# Patient Record
Sex: Female | Born: 1955
Health system: Southern US, Community
[De-identification: ages and names within clinical notes are randomized; demographics above are authoritative.]

## PROBLEM LIST (undated history)

## (undated) DIAGNOSIS — F329 Major depressive disorder, single episode, unspecified: Secondary | ICD-10-CM

## (undated) DIAGNOSIS — F419 Anxiety disorder, unspecified: Secondary | ICD-10-CM

## (undated) DIAGNOSIS — N952 Postmenopausal atrophic vaginitis: Secondary | ICD-10-CM

## (undated) DIAGNOSIS — K219 Gastro-esophageal reflux disease without esophagitis: Secondary | ICD-10-CM

## (undated) DIAGNOSIS — M1990 Unspecified osteoarthritis, unspecified site: Secondary | ICD-10-CM

## (undated) DIAGNOSIS — F32A Depression, unspecified: Secondary | ICD-10-CM

## (undated) DIAGNOSIS — J302 Other seasonal allergic rhinitis: Secondary | ICD-10-CM

## (undated) DIAGNOSIS — I10 Essential (primary) hypertension: Secondary | ICD-10-CM

## (undated) DIAGNOSIS — G4730 Sleep apnea, unspecified: Secondary | ICD-10-CM

## (undated) HISTORY — PX: OTHER SURGICAL HISTORY: SHX169

## (undated) HISTORY — DX: Sleep apnea, unspecified: G47.30

## (undated) HISTORY — DX: Anxiety disorder, unspecified: F41.9

## (undated) HISTORY — DX: Postmenopausal atrophic vaginitis: N95.2

## (undated) HISTORY — DX: Other seasonal allergic rhinitis: J30.2

## (undated) HISTORY — PX: COLONOSCOPY: SHX174

## (undated) HISTORY — PX: ABDOMINAL HYSTERECTOMY: SHX81

## (undated) HISTORY — DX: Unspecified osteoarthritis, unspecified site: M19.90

---

## 2002-04-05 ENCOUNTER — Other Ambulatory Visit: Admission: RE | Admit: 2002-04-05 | Discharge: 2002-04-05 | Payer: Self-pay | Admitting: Obstetrics and Gynecology

## 2002-04-08 ENCOUNTER — Ambulatory Visit (HOSPITAL_COMMUNITY): Admission: RE | Admit: 2002-04-08 | Discharge: 2002-04-09 | Payer: Self-pay | Admitting: Surgery

## 2002-05-25 ENCOUNTER — Encounter: Admission: RE | Admit: 2002-05-25 | Discharge: 2002-05-25 | Payer: Self-pay | Admitting: Obstetrics and Gynecology

## 2002-05-25 ENCOUNTER — Encounter: Payer: Self-pay | Admitting: Obstetrics and Gynecology

## 2002-06-24 ENCOUNTER — Ambulatory Visit (HOSPITAL_BASED_OUTPATIENT_CLINIC_OR_DEPARTMENT_OTHER): Admission: RE | Admit: 2002-06-24 | Discharge: 2002-06-24 | Payer: Self-pay | Admitting: Surgery

## 2002-07-05 ENCOUNTER — Ambulatory Visit (HOSPITAL_BASED_OUTPATIENT_CLINIC_OR_DEPARTMENT_OTHER): Admission: RE | Admit: 2002-07-05 | Discharge: 2002-07-05 | Payer: Self-pay | Admitting: Surgery

## 2002-10-18 ENCOUNTER — Ambulatory Visit (HOSPITAL_COMMUNITY): Admission: RE | Admit: 2002-10-18 | Discharge: 2002-10-18 | Payer: Self-pay | Admitting: Gastroenterology

## 2005-01-07 ENCOUNTER — Encounter: Admission: RE | Admit: 2005-01-07 | Discharge: 2005-01-07 | Payer: Self-pay | Admitting: Family Medicine

## 2005-12-27 ENCOUNTER — Encounter: Admission: RE | Admit: 2005-12-27 | Discharge: 2005-12-27 | Payer: Self-pay | Admitting: Family Medicine

## 2006-12-22 ENCOUNTER — Ambulatory Visit (HOSPITAL_COMMUNITY): Admission: RE | Admit: 2006-12-22 | Discharge: 2006-12-23 | Payer: Self-pay | Admitting: Orthopedic Surgery

## 2008-10-24 ENCOUNTER — Encounter: Admission: RE | Admit: 2008-10-24 | Discharge: 2008-10-24 | Payer: Self-pay | Admitting: Orthopedic Surgery

## 2009-01-30 ENCOUNTER — Encounter: Admission: RE | Admit: 2009-01-30 | Discharge: 2009-01-30 | Payer: Self-pay | Admitting: Family Medicine

## 2010-06-10 HISTORY — PX: JOINT REPLACEMENT: SHX530

## 2010-07-01 ENCOUNTER — Encounter: Payer: Self-pay | Admitting: Family Medicine

## 2010-09-20 ENCOUNTER — Other Ambulatory Visit: Payer: Self-pay | Admitting: Orthopedic Surgery

## 2010-09-20 ENCOUNTER — Other Ambulatory Visit (HOSPITAL_COMMUNITY): Payer: Self-pay | Admitting: Orthopedic Surgery

## 2010-09-20 ENCOUNTER — Ambulatory Visit (HOSPITAL_COMMUNITY)
Admission: RE | Admit: 2010-09-20 | Discharge: 2010-09-20 | Disposition: A | Discharge status: Home / Self Care | Payer: 59 | Source: Ambulatory Visit | Attending: Orthopedic Surgery | Admitting: Orthopedic Surgery

## 2010-09-20 ENCOUNTER — Encounter (HOSPITAL_COMMUNITY): Payer: 59

## 2010-09-20 DIAGNOSIS — Z01810 Encounter for preprocedural cardiovascular examination: Secondary | ICD-10-CM | POA: Insufficient documentation

## 2010-09-20 DIAGNOSIS — Z01818 Encounter for other preprocedural examination: Secondary | ICD-10-CM | POA: Insufficient documentation

## 2010-09-20 DIAGNOSIS — I10 Essential (primary) hypertension: Secondary | ICD-10-CM

## 2010-09-20 DIAGNOSIS — M1710 Unilateral primary osteoarthritis, unspecified knee: Secondary | ICD-10-CM | POA: Insufficient documentation

## 2010-09-20 DIAGNOSIS — Z01812 Encounter for preprocedural laboratory examination: Secondary | ICD-10-CM | POA: Insufficient documentation

## 2010-09-20 LAB — CBC
HCT: 40.5 % (ref 36.0–46.0)
Hemoglobin: 13.4 g/dL (ref 12.0–15.0)
MCV: 92.7 fL (ref 78.0–100.0)
RBC: 4.37 MIL/uL (ref 3.87–5.11)
WBC: 7.4 10*3/uL (ref 4.0–10.5)

## 2010-09-20 LAB — URINALYSIS, ROUTINE W REFLEX MICROSCOPIC
Glucose, UA: NEGATIVE mg/dL
Hgb urine dipstick: NEGATIVE
Protein, ur: NEGATIVE mg/dL
Specific Gravity, Urine: 1.019 (ref 1.005–1.030)
pH: 6.5 (ref 5.0–8.0)

## 2010-09-20 LAB — BASIC METABOLIC PANEL
BUN: 17 mg/dL (ref 6–23)
CO2: 28 mEq/L (ref 19–32)
Chloride: 103 mEq/L (ref 96–112)
Creatinine, Ser: 0.77 mg/dL (ref 0.4–1.2)
Potassium: 4 mEq/L (ref 3.5–5.1)

## 2010-09-20 LAB — PROTIME-INR
INR: 0.99 (ref 0.00–1.49)
Prothrombin Time: 13.3 s (ref 11.6–15.2)

## 2010-09-20 LAB — SURGICAL PCR SCREEN
MRSA, PCR: NEGATIVE
Staphylococcus aureus: POSITIVE — AB

## 2010-09-20 LAB — DIFFERENTIAL
Eosinophils Relative: 3 % (ref 0–5)
Lymphocytes Relative: 27 % (ref 12–46)
Lymphs Abs: 2 10*3/uL (ref 0.7–4.0)
Monocytes Absolute: 0.7 10*3/uL (ref 0.1–1.0)
Monocytes Relative: 9 % (ref 3–12)
Neutro Abs: 4.5 10*3/uL (ref 1.7–7.7)

## 2010-09-28 NOTE — H&P (Signed)
[name], [name]              ACCOUNT NO.:  [id number]  MEDICAL RECORD NO.:  [id number]           PATIENT TYPE:  I  LOCATION:  1S                           FACILITY:  [facility]  PHYSICIAN:  Madlyn Frankel. Charlann Boxer, M.D.  DATE OF BIRTH:  [birth year]  DATE OF ADMISSION:  09/18/2010 DATE OF DISCHARGE:                             HISTORY & PHYSICAL   CHIEF COMPLAINT:  Right knee osteoarthritis.  HISTORY OF PRESENT ILLNESS:  This is a 55 year old lady with a history of osteoarthritis of the right knee that has failed conservative management to alleviate her discomfort.  After discussion of treatment, benefits, risks, and options, the patient is now scheduled for total knee arthroplasty of her right knee.  The patient is a candidate for Tranexamic acid and will receive that in preop holding area.  She is going to be going to a rehab facility postoperatively so postoperative medicines were not given today.  PAST MEDICAL HISTORY:  Drug allergy to IODINE on the skin with rash.  CURRENT MEDICATIONS: 1. Prozac 40 mg daily. 2. Nexium 40 mg daily. 3. Lisinopril 10 mg daily. 4. Aspirin 81 mg two daily. 5. Nabumetone 1500 mg daily. 6. Alprazolam 0.25 mg p.r.n. 7. Hydrocodone 10/650 one-half tablet daily. 8. AdrenaSense for hot flashes, it is an over-the-counter medication. 9. Flonase p.r.n.  PREVIOUS SURGERIES:  Knee arthroscopy, rotator cuff repair, and hysterectomy.  SERIOUS MEDICAL ILLNESSES:  Reflux and hypertension.  FAMILY HISTORY:  Positive for cancer.  SOCIAL HISTORY:  The patient is married.  She works as a Interior and spatial designer. She does not smoke and has 2 drinks per day.  REVIEW OF SYSTEMS:  CENTRAL NERVOUS SYSTEM:  Positive for anxiety and depression.  CARDIOVASCULAR:  Negative for chest pain or palpitation. PULMONARY:  Negative for shortness of breath, PND, or orthopnea.  GI: Negative for ulcers or hepatitis.  GU:  Negative.  MUSCULOSKELETAL:  As in HPI.  PHYSICAL EXAMINATION:   VITAL SIGNS:  BP 160/108, repeated 160/110.  She is advised to go by her medical doctor's office on leaving our office today to have this rechecked and possibly modify her medications, respirations 16, pulse 76 and regular. GENERAL APPEARANCE:  This is a well-developed and well-nourished lady in no acute distress. HEENT:  Head normocephalic.  Nose patent.  Ears patent.  Pupils equal, round, and reactive to light.  Throat without injection. NECK:  Supple without adenopathy.  Carotids are 2+ without bruit. CHEST:  Clear to auscultation.  No rales or rhonchi.  Respirations 16. HEART:  Regular rate and rhythm at 76 beats per minute without murmur. ABDOMEN:  Soft.  Active bowel sounds.  No masses or organomegaly. NEUROLOGIC:  The patient is alert and oriented to time, place, and person.  Cranial nerves II through XII grossly intact. EXTREMITIES:  The right knee with a 4-degree flexion contracture, further flexion to 115 degrees with pain.  Neurovascular status intact.  ASSESSMENT:  Osteoarthritis, right knee.  PLAN:  Total knee arthroplasty, right knee.     Jaquelyn Bitter. Chabon, P.A.   ______________________________ Madlyn Frankel Charlann Boxer, M.D.    SJC/MEDQ  D:  09/19/2010  T:  09/20/2010  Job:  [id number]  Electronically Signed by Jodene Nam P.A. on 09/26/2010 07:35:18 AM Electronically Signed by Durene Romans M.D. on 09/28/2010 03:44:57 PM

## 2010-10-01 ENCOUNTER — Inpatient Hospital Stay (HOSPITAL_COMMUNITY)
Admission: RE | Admit: 2010-10-01 | Discharge: 2010-10-04 | DRG: 470 | Disposition: A | Discharge status: Skilled Nursing Facility | Payer: 59 | Source: Ambulatory Visit | Attending: Orthopedic Surgery | Admitting: Orthopedic Surgery

## 2010-10-01 DIAGNOSIS — F411 Generalized anxiety disorder: Secondary | ICD-10-CM | POA: Diagnosis present

## 2010-10-01 DIAGNOSIS — I10 Essential (primary) hypertension: Secondary | ICD-10-CM | POA: Diagnosis present

## 2010-10-01 DIAGNOSIS — K219 Gastro-esophageal reflux disease without esophagitis: Secondary | ICD-10-CM | POA: Diagnosis present

## 2010-10-01 DIAGNOSIS — M1710 Unilateral primary osteoarthritis, unspecified knee: Principal | ICD-10-CM | POA: Diagnosis present

## 2010-10-01 LAB — ABO/RH: ABO/RH(D): A NEG

## 2010-10-01 LAB — TYPE AND SCREEN: ABO/RH(D): A NEG

## 2010-10-01 NOTE — Op Note (Signed)
[name], [name]              ACCOUNT NO.:  [id number]  MEDICAL RECORD NO.:  [id number]           PATIENT TYPE:  I  LOCATION:  0008                         FACILITY:  [facility]  PHYSICIAN:  Madlyn Frankel. Charlann Boxer, M.D.  DATE OF BIRTH:  19-Feb-1956  DATE OF PROCEDURE:  10/01/2010 DATE OF DISCHARGE:                              OPERATIVE REPORT   PREOPERATIVE DIAGNOSIS:  Right knee osteoarthritis.  POSTOPERATIVE DIAGNOSIS:  Right knee osteoarthritis.  PROCEDURE:  Right total knee replacement.  COMPONENTS USED:  DePuy rotating platform posterior stabilized knee system with size 3 femur, 3 tibia, 12.5 insert, and 38 patellar button.  SURGEON:  Madlyn Frankel. Charlann Boxer, MD  ASSISTANT:  Jaquelyn Bitter. Chabon, PA-C  ANESTHESIA:  Spinal.  SPECIMEN:  None.  COMPLICATIONS:  None.  DRAINS:  One Hemovac.  TOURNIQUET TIME:  28 minutes at 250 mmHg.  BLOOD LOSS:  Minimal.  INDICATIONS FOR PROCEDURE:  [name] is a 55 year old female who has been following the office for right knee degenerative joint changes. She had failed respond to conservative measures at this point and wished to proceed with more definitive measures.  Right total knee replacement discussed versus other options available based on the location of her pain.  Risks of infection, DVT, component failure, need for revision surgery as well as the postop course and expectations were all reviewed. Consent was obtained for benefit of pain relief.  PROCEDURE IN DETAIL:  The patient was brought to operative theater. Once adequate anesthesia, preoperative antibiotics, Ancef administered, she was positioned supine with the right thigh tourniquet placed.  The right lower extremity was then prepped and draped in sterile fashion with the right leg placed in the Mayo leg holder.  A time-out was performed identifying the patient, planned procedure, and the extremity.  Leg was exsanguinated, tourniquet elevated to 250 mmHg.  Midline incision  was made followed by median and parapatellar arthrotomy. Following initial exposure and debridement, attention was directed to the patella, precut measure was 21 mm.  I resected down to about 13-14 mm and used a 38 patellar button, restored patellar height, and covered the cut surface.  Lug holes were drilled and a metal shim placed to protect the patella from retractors and saw blades.  Attention was now directed to the femur.  The femoral canal was opened with the drill, irrigated to try to prevent fat emboli, and intramedullary rod was passed at 3 degrees of valgus, 10 mm of bone resected off the distal femur.  Following this resection, the tibia was subluxated anteriorly using extramedullary guide, and 10 mm of bone was resected off the proximal based off the proximal lateral tibia.  I then confirmed the cut, was perpendicular in the coronal plane as well as having appropriate neutral slope.  I then confirmed the gap, was stable with a 10-mm insert.  At this point, we sized the femur to be a size 3.  A size 3 rotation block was pinned in position in the anterior reference using the C clamp with set rotation.  The former cutting block was then placed to confirm that there will be no notching pinned into  position.  At this point, the anterior-posterior and chamfer cuts were then made without difficulty nor notching.  Final box cut was made off the lateral aspect of distal femur.  The tibia was then subluxated anteriorly and using the size 3 tibial tray, was pinned into position, drilled, and keel punched.  Trial reduction was now carried out with 3 femur, 3 tibia, and initially a 10- mm insert where it was thought there was a little bit of hyperextension. For this reason, I went ahead and exchanged out for the 12.5 insert to help with both extension and flexion stability.  The patella tracked through the trochlea without application of pressure.  Given these findings, the trial  components were removed.  The synovial capsule junction of the knee was injected with 0.25% Marcaine with epinephrine, 1 mL of Toradol, total of 51 mL.  The knee was then irrigated with normal saline solution and pulse lavaged.  Final components were opened and cement mixed.  Final components were cemented onto clean and dried cut surfaces of bone.  Knee was brought to extension with a 12.5 insert.  As the cement cured, excessive cement was removed.  Once the cement had fully cured, an excessive cement was removed throughout the knee.  The final 12.5 insert was chosen and inserted.  Tourniquet had been let down after 28 minutes without significant hemostasis required.  Medium Hemovac drain was placed deep and the knee was reirrigated with normal saline solution.  The extensor mechanism was then reapproximated using #1 Vicryl with the knee in flexion.  The remainder of wound was closed with 2-0 Vicryl, running for Monocryl.  The knee was cleaned, dried, and dressed sterilely with Dermabond and Aquacel dressing.  Drain site dressed separately.  She was then brought to recovery room in stable condition tolerating the procedure well.     Madlyn Frankel Charlann Boxer, M.D.     MDO/MEDQ  D:  10/01/2010  T:  10/01/2010  Job:  [id number]  Electronically Signed by Durene Romans M.D. on 10/01/2010 06:15:41 PM

## 2010-10-02 LAB — BASIC METABOLIC PANEL
CO2: 29 mEq/L (ref 19–32)
Calcium: 8.3 mg/dL — ABNORMAL LOW (ref 8.4–10.5)
Creatinine, Ser: 0.66 mg/dL (ref 0.4–1.2)
GFR calc Af Amer: >60 mL/min (ref >60)
GFR calc non Af Amer: >60 mL/min (ref >60)
Glucose, Bld: 99 mg/dL (ref 70–99)
Sodium: 138 mEq/L (ref 135–145)

## 2010-10-02 LAB — CBC
HCT: 35.8 % — ABNORMAL LOW (ref 36.0–46.0)
Hemoglobin: 11.6 g/dL — ABNORMAL LOW (ref 12.0–15.0)
MCH: 30.1 pg (ref 26.0–34.0)
MCHC: 32.4 g/dL (ref 30.0–36.0)
RDW: 14.2 % (ref 11.5–15.5)

## 2010-10-03 LAB — BASIC METABOLIC PANEL
Calcium: 8.9 mg/dL (ref 8.4–10.5)
Creatinine, Ser: 0.7 mg/dL (ref 0.4–1.2)
GFR calc Af Amer: >60 mL/min (ref >60)
GFR calc non Af Amer: >60 mL/min (ref >60)
Sodium: 137 mEq/L (ref 135–145)

## 2010-10-03 LAB — CBC
HCT: 34.4 % — ABNORMAL LOW (ref 36.0–46.0)
Hemoglobin: 11.7 g/dL — ABNORMAL LOW (ref 12.0–15.0)
MCH: 31 pg (ref 26.0–34.0)
MCHC: 34 g/dL (ref 30.0–36.0)
MCV: 91.2 fL (ref 78.0–100.0)
Platelets: 487 K/uL — ABNORMAL HIGH (ref 150–400)
RBC: 3.77 MIL/uL — ABNORMAL LOW (ref 3.87–5.11)
RDW: 13.8 % (ref 11.5–15.5)
WBC: 11 K/uL — ABNORMAL HIGH (ref 4.0–10.5)

## 2010-10-08 NOTE — Discharge Summary (Signed)
NAMEJILLIANNE, [name]              ACCOUNT NO.:  [id number]  MEDICAL RECORD NO.:  [id number]           PATIENT TYPE:  I  LOCATION:  1620                         FACILITY:  [facility]  PHYSICIAN:  Madlyn Frankel. Charlann Boxer, M.D.  DATE OF BIRTH:  May 09, 1956  DATE OF ADMISSION:  10/01/2010 DATE OF DISCHARGE:                              DISCHARGE SUMMARY   ADMITTING DIAGNOSIS:  Right knee osteoarthritis.  DISCHARGE DIAGNOSES: 1. Right knee osteoarthritis. 2. Hypertension. 3. Reflux disease. 4. Anxiety. 5. History of menopause.  BRIEF HISTORY:  [name] is a 55 year old female with history of right knee osteoarthritis, failing conservative measures, progressive degenerative changes with pain.  She, at this point, is ready for knee replacement surgery.  She wished to proceed in this fashion.  Risks and benefits were discussed in office and consent obtained based on her office visits.  HOSPITAL COURSE:  The patient admitted for same-day surgery on October 01, 2010.  She underwent right total knee replacement.  This was an uncomplicated procedure.  Please see dictated operative note for full details of procedure.  After routine stay in the recovery room, she was transferred to the orthopedic ward, where her hospital stay was unremarkable.  She did have some problems on the first postoperative day with regards some nausea and pain issues.  She had a Hemovac drain and Foley catheter removed.  On postop day #1, she is noted to have hematocrit of 35.8.  Electrolytes remained stable. She was seen and evaluated by therapy and mobilized to a degree. Preoperatively, she wished to proceed to skilled nursing facility for rehab purposes, FL-2 form filled out and social work consult made.  Postop day #2, she felt that she was doing much better with regards to pain control and ready to tackle a day.  Her hematocrit remained stable at 34.4 and electrolytes remained stable.  Her knee wound was  dry. Hemovac had been removed.  Postoperative day #2 on October 03, 2010, we had prepared her discharge due to the fact that she is going to rehab with standard insurance, non Medicare.  She was on a regular diet, tolerating this well.  DISCHARGE CONDITION:  She is stable at the time of discharge with no postoperative complication.  DISCHARGE INSTRUCTIONS:  Patient will be discharged as planned preoperatively for skilled nursing facility for rehab purposes.  She will work on Investment banker, operational, strengthening, range of motion.  She returned to see Dr. Durene Romans at [facility] at office number [phone] in 2 weeks' time for wound evaluation and assessing her recovery.  She may shower, her current dressing is an Aquacel dressing that remained in place until Oct 09, 2010, it can be then removed and dry gauze applied.  If there are any orthopedic questions, we can be contacted.  DISCHARGE MEDICATIONS: 1. Colace 100 mg b.i.d. as needed for constipation while on pain     medicine. 2. MiraLax 17 g p.o. daily as needed for constipation while on pain     medicine. 3. Aspirin 325 mg p.o. b.i.d. for blood thinning purposes. 4. Iron 325 mg 1 tablet b.i.d. as needed for 2 weeks.  5. Robaxin 500 mg p.o. q.6h. as needed for muscle spasm or pain. 6. Oxycodone 5 to 15 mg every 4 to 6 hours as needed for pain. 7. __________ 2 capsules q.a.m. for hot flashes. 8. Alprazolam 0.25 mg b.i.d. as needed. 9. Flonase daily as needed. 10.Aspirin 81 mg after 325 mg doses as previously taken. 11.Hydroxycut over-the-counter 2 capsules b.i.d. 12.Lisinopril 10 mg q.a.m. 13.Relafen as needed. 14.Nexium 40 mg q.a.m. 15.Prozac 40 mg q.a.m.     Madlyn Frankel Charlann Boxer, M.D.     MDO/MEDQ  D:  10/03/2010  T:  10/03/2010  Job:  [id number]  Electronically Signed by Durene Romans M.D. on 10/08/2010 12:14:24 PM

## 2010-10-23 NOTE — Op Note (Signed)
NAMEWILLIAM, [name]              ACCOUNT NO.:  [id number]   MEDICAL RECORD NO.:  [id number]          PATIENT TYPE:  AMB   LOCATION:  DAY                          FACILITY:  [facility]   PHYSICIAN:  Marlowe Kays, M.D.  DATE OF BIRTH:  06/15/1955   DATE OF PROCEDURE:  12/22/2006  DATE OF DISCHARGE:                               OPERATIVE REPORT   PREOPERATIVE DIAGNOSIS:  Chronic impingement syndrome with rotator cuff  tendinopathy, right shoulder.   POSTOPERATIVE DIAGNOSIS:  Chronic impingement syndrome with rotator cuff  tendinopathy, right shoulder.   OPERATION:  Right shoulder arthroscopy (within normal limits);  arthroscopic subacromial decompression and decompression of distal  inferior clavicle.   SURGEON:  Marlowe Kays, M.D.   ASSISTANT:  Nurse.   ANESTHESIA:  General anesthesia.   PATHOLOGY AND JUSTIFICATION FOR PROCEDURE:  Painful right shoulder with  MRI on December 10, 2006, demonstrating partial tear of the supraspinatus  tendon without evidence of a full tear.  At surgery, these findings were  confirmed but she also had some impingement from the distal inferior  clavicle as well.   PROCEDURE:  Satisfactory general anesthesia, beach-chair position on the  Allen frame, time-out performed, right shoulder girdle was prepped with  pHisoHex because of her iodine allergy and draped in sterile field.  Anatomy of the shoulder joint was marked out and posterior soft spot and  lateral portals were infiltrated with 0.50% Marcaine and adrenalin as  was the subacromial space.  Through a posterior soft spot portal I  atraumatically entered the glenohumeral joint which was normal on  evaluation, representative pictures were taken.  I then redirected the  scope to the subacromial space and through a lateral portal, introduced  a 4.2 shaver.  She had a fairly vibrant bursitis which I cleared with  the 4.2 shaver.  I then introduced the ArthroCare vaporizer and began  removing soft  tissue from the undersurface of the acromion and finding  that the distal clavicle was also impingement fracture from there as  well.  Followed this with a 4 mm oval bur, decompressing the clavicle  and acromion and went back-and-forth between the bur and the vaporizer  until we had wide decompression, documented with arm to her side and arm  abducted.  At the conclusion of the case, there was no bleeding  apparent.  Decompression had been completed.  I removed all fluid  possible from the subacromial space and infiltrated this space as well  as the two portals with Marcaine with adrenalin once again and closed  the portals and 4-0 nylon.  We applied pHisoHex, Adaptic and dry sterile  dressing followed by a shoulder immobilizer.  She tolerated the  procedure well and was taken to the recovery room in satisfactory  condition with no known complications.           ______________________________  Marlowe Kays, M.D.    JA/MEDQ  D:  12/22/2006  T:  12/22/2006  Job:  [id number]

## 2010-10-26 NOTE — Op Note (Signed)
NAME:  [name], [name]                        ACCOUNT NO.:  [id number]   MEDICAL RECORD NO.:  [id number]                   PATIENT TYPE:  AMB   LOCATION:  DAY                                  [facility]ACILITY:  [facility]   PHYSICIAN:  Currie Paris, M.D.           DATE OF BIRTH:  [birth year]   DATE OF PROCEDURE:  04/08/2002  DATE OF DISCHARGE:                                 OPERATIVE REPORT   PREOPERATIVE DIAGNOSES:  Perirectal abscess, left side.   POSTOPERATIVE DIAGNOSES:  Perirectal abscess, left side.   OPERATION:  I & D of deep perirectal, possible supralevator abscess.   SURGEON:  Currie Paris, M.D.   ANESTHESIA:  General.   CLINICAL HISTORY:  This patient is a 55 year old presenting with an  increasing mass in the left buttock area that essentially is currently  treated with some antibiotics, and it has continued to get worse.  She has  had a fever today but had increasing pain.  On exam, she had a fluctuant  mass extending from the perianal area laterally into the left buttock.  After discussion with the patient, she elected to proceed to drainage under  anesthesia.   DESCRIPTION OF PROCEDURE:  The patient was seen in the holding area and had  no further questions. She was taken to the operating room, and after  satisfactory general endotracheal anesthesia had been obtained, she was  placed in the lithotomy position and the perineal area prepped and draped.  A digital rectal exam showed really no fluctuance perirectally inside the  rectum, but the most fluctuance was noted just lateral to the sphincter  muscles in the direct lateral position on the left side.  The fluctuance  extended several cm beyond this.   I made an elliptical skin incision to take a small button of skin, going in  a radial fashion directly over the most fluctuant area and then entered a  large abscess cavity.  The incision was extended a little bit laterally, so  I had a good open drainage,  and I was able to put a tonsillar suction device  in and sucked this completely dry.  The cavity extended perirectally fairly  high up.  I was not convinced whether it went supralevator or not but  decided not to probe too much higher up as I thought I had good drainage  here.   Using the anoscope, I tried to find an internal opening and saw one  possibility, but I did not want to probe too much and create a false  channel.   Everything appeared to be dry.  I used cautery on skin edges, and I put 1-  inch Penrose drains in and held them with a suture to promote drainage.  I  packed it and completed the procedure.   The patient tolerated the procedure well.  There were no operative  complications.  All counts were correct.  Currie Paris, M.D.    CJS/MEDQ  D:  04/08/2002  T:  04/09/2002  Job:  [id number]

## 2010-10-26 NOTE — Op Note (Signed)
NAME:  [name], [name]                        ACCOUNT NO.:  [id number]   MEDICAL RECORD NO.:  [id number]                   PATIENT TYPE:  AMB   LOCATION:  DSC                                  FACILITY:  [facility]   PHYSICIAN:  Currie Paris, M.D.           DATE OF BIRTH:  23-Jul-1955   DATE OF PROCEDURE:  06/24/2002  DATE OF DISCHARGE:                                 OPERATIVE REPORT   OFFICE MEDICAL RECORD NUMBER:  [id number]   PREOPERATIVE DIAGNOSIS:  Fistula in ano.   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   PROCEDURE:  Partial anal fistulotomy with drainage and debridement of  chronic abscess and placement of Seton.   SURGEON:  Currie Paris, M.D.   ANESTHESIA:  General.   INDICATIONS FOR PROCEDURE:  This patient is a 54 year old lady who had a  large perirectal abscess drained a few months ago, but has developed a  nonhealing fistula.  She continues to have intermittent drainage with  occasional feculent drainage especially when she would have an episode of  diarrhea.   DESCRIPTION OF PROCEDURE:  The patient was seen in the holding area and had  no further questions. She was taken to the operating room and after  satisfactory general anesthesia had been obtained, she was placed in the  lithotomy position.  The perianal area was prepped with Hibiclens-type  solution, since the patient is allergic to iodine and draped.  Initially, I  did a rectal examination and could feel no masses or induration.  The  anoscope was introduced and since the abscess seemed to have drained just a  little bit posterior to the midline, I was looking for an internal opening,  and putting a probe into the tract, but while the probe went in about 3 to 4  cm, I could not really identify initially an opening, especially looking  posteriorly.  I tried injecting a little peroxide and still did not see an  opening.  Then I moved my scope around so I could  look more directly  laterally, and saw an area that is just anterior to the midline on the left  lateral aspect and using a separate probe, found that this drained into the  fistula.  With a little more gentle manipulation, I was able to get the  probe from outside to go into the tract.   I injected some Marcaine with epinephrine to help with postoperative  analgesia and also to diminish bleeding.  Starting from the external  opening, I divided the skin and subcutaneous tissues over the probe until I  saw where I was getting sphincter muscle where I stopped dividing anything  other than a little bit of the mucosa over the sphincter.  There was  actually, a fairly sizable abscess cavity present, perhaps a couple of cm in  size, and once I had good access with this external opening, I  used a curet  and some gauze to swab out any old granulations in this whole cavity.   I then passed two #1 Tycron sutures through to act as a Patent examiner. One was tied  loosely and the other snugly just around the sphincter so that it could  being to cut through.  I then put a 1/4 inch Penrose drain into the abscess  cavity so we would make sure we have good drainage of the cavity for the  first days postoperatively.   Sterile dressings were applied. The patient tolerated the procedure well.  There were no operative complications.  All needle, sponge, and instrument  count correct.                                               Currie Paris, M.D.    CJS/MEDQ  D:  06/24/2002  T:  06/24/2002  Job:  [id number]   cc:   L. Lupe Carney, M.D.  [address] [place]  [place] [address]  Fax: [phone]   Juluis Mire, M.D.  [address] [place]  [place] [address]  Fax: [phone]

## 2010-10-26 NOTE — Op Note (Signed)
NAME:  [name], [name]                        ACCOUNT NO.:  [id number]   MEDICAL RECORD NO.:  [id number]                   PATIENT TYPE:  AMB   LOCATION:  ENDO                                 FACILITY:  [facility]   PHYSICIAN:  Anselmo Rod, M.D.               DATE OF BIRTH:  Dec 26, 1955   DATE OF PROCEDURE:  10/18/2002  DATE OF DISCHARGE:                                 OPERATIVE REPORT   PROCEDURE:  Colonoscopy.   ENDOSCOPIST:  Anselmo Rod, M.D.   INSTRUMENT USED:  Olympus video colonoscope.   INDICATIONS FOR PROCEDURE:  A 55 year old white female with a history of  rectal fistula and rectal bleeding, rule out IBD.   PREPROCEDURE PREPARATION:  Informed consent was procured from the patient  and the patient was fasted for eight hours prior to the patient and prepped  with a bottle of magnesium citrate and a gallon of NuLytely the night prior  to the procedure.   PREPROCEDURE PHYSICAL EXAMINATION:  VITAL SIGNS: Stable.  NECK:  Supple.  CHEST:  Clear to auscultation.  S1 and S2 regular.  ABDOMEN:  Soft with normal bowel sounds.   DESCRIPTION OF PROCEDURE:  The patient was placed in the left lateral  decubitus position and sedated with 100 mg of Demerol and 10 mg of Versed  intravenously.  Once the patient was adequately sedated and maintained on  low flow oxygen and continuous cardiac monitoring, the Olympus video  colonoscope was advanced from the rectum to the cecum and terminal ileum.  The appendiceal orifice and ileocecal valve were clearly visualized and  photographed. The terminal ileum appeared normal without lesion. There was  no evidence of Crohn's disease or colitis. The entire colonic mucosa  appeared healthy and without lesions.  Retroflexion in the rectum revealed  small internal hemorrhoids. There was a surgical scar present on the  anterior aspect of the buttocks from a previous fistula repair. The patient  tolerated the procedure well without  complications.   IMPRESSION:  1. Normal colonoscopy to the terminal ileum except for small internal     hemorrhoids.  2. Scar present on medial aspect of the buttocks from fistula repair in the     past.    RECOMMENDATIONS:  Repeat colorectal cancer screening is recommended in the  next five years, unless the patient develops any abnormal symptoms in the  interim.  Continue present therapy.  Outpatient follow-up as needed.                                               Anselmo Rod, M.D.    JNM/MEDQ  D:  10/18/2002  T:  10/19/2002  Job:  [id number]   cc:   S. Kyra Manges, M.D.  [phone]  [place]  [place] [address]  Fax: [phone]   Currie Paris, M.D.  [address]  [place]  [place] [address]  Fax: [phone]

## 2010-10-26 NOTE — Op Note (Signed)
   NAME:  [name], [name]                        ACCOUNT NO.:  [id number]   MEDICAL RECORD NO.:  [id number]                   PATIENT TYPE:  AMB   LOCATION:  DSC                                  FACILITY:  [facility]   PHYSICIAN:  Currie Paris, M.D.           DATE OF BIRTH:  08-10-1955   DATE OF PROCEDURE:  DATE OF DISCHARGE:                                 OPERATIVE REPORT   OFFICE MEDICAL RECORD NUMBER:  [id number]   PREOPERATIVE DIAGNOSIS:  Fistula in ano with previous Seton placement.   POSTOPERATIVE DIAGNOSIS:  Fistula in ano with previous Seton placement.   OPERATION/PROCEDURE:  Replacement of Seton and debridement of chronic  fistula.   ANESTHESIA:  General.   INDICATIONS FOR PROCEDURE:  This patient is a 55 year old who underwent I&D  of a giant perirectal abscess a couple of months ago.  She was left with a  fistula in ano and approximately two weeks ago was taken to the operating  room and partial fistulotomy done with debridement of a residual abscess  cavity and placement of Seton.   She was seen in the office and it was elected to bring her back to the  operating room for placement of the [facility] and to debride and make sure that  the abscess was starting to heal up.   DESCRIPTION OF PROCEDURE:  The patient was seen in the holding area, had no  further questions.  She was taken to the operating room, placed after  satisfactory general anesthesia, placed in the lithotomy position.  The  abscess cavity was much smaller than it had been and there was still some  chronic granulations which were debrided both with wiping with a gauze as  well as a little curet.  I then placed the [facility], again using a #1  nonabsorbable  braided suture.  This was tied down snugly but not too tightly.  Everything  appeared to be dry.  I injected some 0.5% Marcaine with epinephrine to help  with postoperative analgesia.  Sterile dressings were applied.  The patient  tolerated the procedure  well.  There were no operative complications.  All  counts were correct.                                                 Currie Paris, M.D.    CJS/MEDQ  D:  07/05/2002  T:  07/05/2002  Job:  [id number]   cc:   L. Lupe Carney, M.D.  [address] [place]  [place] [address]  Fax: [phone]   Juluis Mire, M.D.  [address] [place]  [place] [address]  Fax: [phone]

## 2011-03-26 LAB — HEMOGLOBIN AND HEMATOCRIT, BLOOD
HCT: 37.9
Hemoglobin: 12.9

## 2011-03-26 LAB — PROTIME-INR: Prothrombin Time: 12.3

## 2012-04-03 ENCOUNTER — Encounter (HOSPITAL_COMMUNITY): Payer: Self-pay | Admitting: Pharmacy Technician

## 2012-04-08 NOTE — Patient Instructions (Addendum)
[address]  04/08/2012   Your procedure is scheduled on:  04-14-2012  Report to [facility] [id number]  AM.  Call this number if you have problems the morning of surgery: [phone]   Remember:   Do not eat food or drink liquids:After Midnight.  .  Take these medicines the morning of surgery with A SIP OF WATER: nexium, prozac, flonase nasal spray if needed   Do not wear jewelry or make up.  Do not wear lotions, powders, or perfumes. You may wear deodorant.    Do not bring valuables to the hospital.  Contacts, dentures or bridgework may not be worn into surgery.  Leave suitcase in the car. After surgery it may be brought to your room.  For patients admitted to the hospital, checkout time is 11:00 AM the day of discharge                             Patients discharged the day of surgery will not be allowed to drive home. If going home same day of surgery, you must have someone stay with you the first 24 hours at home and arrange for some one to drive you home from hospital.    Special Instructions: See [facility] Preparing for Surgery instruction sheet. Women do not shave legs or underarms for 12 hours before showers. Men may shave face morning of surgery.    Please read over the following fact sheets that you were given: MRSA Information, blood fact sheet, incentive spirometer fact sheet  [facility] pre op nurse phone number [phone], call if needed

## 2012-04-09 ENCOUNTER — Ambulatory Visit (HOSPITAL_COMMUNITY)
Admission: RE | Admit: 2012-04-09 | Discharge: 2012-04-09 | Disposition: A | Discharge status: Home / Self Care | Payer: 59 | Source: Ambulatory Visit | Attending: Orthopedic Surgery | Admitting: Orthopedic Surgery

## 2012-04-09 ENCOUNTER — Encounter (HOSPITAL_COMMUNITY)
Admission: RE | Admit: 2012-04-09 | Discharge: 2012-04-09 | Disposition: A | Discharge status: Home / Self Care | Payer: 59 | Source: Ambulatory Visit | Attending: Orthopedic Surgery | Admitting: Orthopedic Surgery

## 2012-04-09 ENCOUNTER — Encounter (HOSPITAL_COMMUNITY): Payer: Self-pay

## 2012-04-09 DIAGNOSIS — Z01818 Encounter for other preprocedural examination: Secondary | ICD-10-CM | POA: Insufficient documentation

## 2012-04-09 HISTORY — DX: Unspecified osteoarthritis, unspecified site: M19.90

## 2012-04-09 HISTORY — DX: Gastro-esophageal reflux disease without esophagitis: K21.9

## 2012-04-09 HISTORY — DX: Depression, unspecified: F32.A

## 2012-04-09 HISTORY — DX: Essential (primary) hypertension: I10

## 2012-04-09 HISTORY — DX: Major depressive disorder, single episode, unspecified: F32.9

## 2012-04-09 LAB — URINALYSIS, ROUTINE W REFLEX MICROSCOPIC
Leukocytes, UA: NEGATIVE
Protein, ur: NEGATIVE mg/dL
Specific Gravity, Urine: 1.018 (ref 1.005–1.030)
Urobilinogen, UA: 0.2 mg/dL (ref 0.0–1.0)

## 2012-04-09 LAB — BASIC METABOLIC PANEL
CO2: 28 mEq/L (ref 19–32)
Chloride: 100 mEq/L (ref 96–112)
Creatinine, Ser: 0.63 mg/dL (ref 0.50–1.10)
Glucose, Bld: 89 mg/dL (ref 70–99)

## 2012-04-09 LAB — CBC
HCT: 38.3 % (ref 36.0–46.0)
MCH: 31.2 pg (ref 26.0–34.0)
MCV: 91.8 fL (ref 78.0–100.0)
RBC: 4.17 MIL/uL (ref 3.87–5.11)
RDW: 13 % (ref 11.5–15.5)
WBC: 7.4 10*3/uL (ref 4.0–10.5)

## 2012-04-09 LAB — SURGICAL PCR SCREEN: MRSA, PCR: NEGATIVE

## 2012-04-09 LAB — APTT: aPTT: 29 seconds (ref 24–37)

## 2012-04-12 NOTE — H&P (Signed)
TOTAL KNEE ADMISSION H&P  Patient is being admitted for left total knee arthroplasty.  Subjective:  Chief Complaint:    Left knee OA / pain.  HPI: [name], 56 y.o. female, has a history of pain and functional disability in the left knee due to arthritis and has failed non-surgical conservative treatments for greater than 12 weeks to includeNSAID's and/or analgesics, corticosteriod injections and activity modification.  Onset of symptoms was gradual, starting 5 years ago with gradually worsening course since that time. The patient noted no past surgery on the left knee(s).  Patient currently rates pain in the left knee(s) at 8 out of 10 with activity. Patient has worsening of pain with activity and weight bearing, pain that interferes with activities of daily living, crepitus and joint swelling.  Patient has evidence of periarticular osteophytes and joint space narrowing by imaging studies. Risks, benefits and expectations were discussed with the patient. Patient understand the risks, benefits and expectations and wishes to proceed with surgery.    D/C Plans:  SNF - [facility]  Post-op Meds:  No Rx given   Tranexamic Acid:  To be given  Decadron:   To be given    Past Medical History  Diagnosis Date  . Hypertension   . Depression   . GERD (gastroesophageal reflux disease)   . Arthritis     Past Surgical History  Procedure Date  . Joint replacement 2012    right  . Abdominal hysterectomy yrs ago  .  fistula repair x 3 10 yrs ago  . Right rotator cuff repair 8 yrs   . Right knee arthroscopic surgery yrs ago    No prescriptions prior to admission   Allergies  Allergen Reactions  . Morphine And Related Itching  . Iodine Rash    History  Substance Use Topics  . Smoking status: Former Smoker -- 0.2 packs/day for 20 years    Types: Cigarettes    Quit date: 06/10/2006  . Smokeless tobacco: Never Used  . Alcohol Use: 1.2 oz/week    2 Glasses of wine per  week    No family history on file.   Review of Systems  Constitutional: Negative.   HENT: Negative.   Eyes: Negative.   Respiratory: Negative.   Cardiovascular: Negative.   Gastrointestinal: Negative.   Genitourinary: Positive for frequency.  Musculoskeletal: Positive for myalgias, back pain and joint pain.  Skin: Negative.   Neurological: Negative.   Endo/Heme/Allergies: Negative.   Psychiatric/Behavioral: Negative.     Objective:  Physical Exam  Constitutional: She appears well-developed and well-nourished.  HENT:  Head: Normocephalic and atraumatic.  Nose: Nose normal.  Mouth/Throat: Oropharynx is clear and moist.  Eyes: Pupils are equal, round, and reactive to light.  Neck: Neck supple. No JVD present. No tracheal deviation present. No thyromegaly present.  Cardiovascular: Normal rate, regular rhythm, normal heart sounds and intact distal pulses.   Respiratory: Effort normal and breath sounds normal. No stridor. No respiratory distress. She has no wheezes.  GI: Soft. There is no tenderness. There is no guarding.  Musculoskeletal:       Left knee: She exhibits decreased range of motion, swelling and bony tenderness. tenderness found.  Lymphadenopathy:    She has no cervical adenopathy.  Neurological: She is alert.  Skin: Skin is warm and dry.  Psychiatric: She has a normal mood and affect.    Imaging Review Plain radiographs demonstrate severe degenerative joint disease of the left knee(s). The overall alignment isneutral. The  bone quality appears to be good for age and reported activity level.  Assessment/Plan:  End stage arthritis, left knee   The patient history, physical examination, clinical judgment of the provider and imaging studies are consistent with end stage degenerative joint disease of the left knee(s) and total knee arthroplasty is deemed medically necessary. The treatment options including medical management, injection therapy arthroscopy and  arthroplasty were discussed at length. The risks and benefits of total knee arthroplasty were presented and reviewed. The risks due to aseptic loosening, infection, stiffness, patella tracking problems, thromboembolic complications and other imponderables were discussed. The patient acknowledged the explanation, agreed to proceed with the plan and consent was signed. Patient is being admitted for inpatient treatment for surgery, pain control, PT, OT, prophylactic antibiotics, VTE prophylaxis, progressive ambulation and ADL's and discharge planning. The patient is planning to be discharged to skilled nursing facility.    Anastasio Auerbach Shakir Petrosino   PAC  04/12/2012, 4:53 PM

## 2012-04-14 ENCOUNTER — Encounter (HOSPITAL_COMMUNITY): Payer: Self-pay | Admitting: *Deleted

## 2012-04-14 ENCOUNTER — Inpatient Hospital Stay (HOSPITAL_COMMUNITY)
Admission: RE | Admit: 2012-04-14 | Discharge: 2012-04-16 | DRG: 470 | Disposition: A | Discharge status: Skilled Nursing Facility | Payer: 59 | Source: Ambulatory Visit | Attending: Orthopedic Surgery | Admitting: Orthopedic Surgery

## 2012-04-14 ENCOUNTER — Encounter (HOSPITAL_COMMUNITY): Payer: Self-pay | Admitting: Anesthesiology

## 2012-04-14 ENCOUNTER — Encounter (HOSPITAL_COMMUNITY)
Admission: RE | Disposition: A | Discharge status: Skilled Nursing Facility | Payer: Self-pay | Source: Ambulatory Visit | Attending: Orthopedic Surgery

## 2012-04-14 ENCOUNTER — Ambulatory Visit (HOSPITAL_COMMUNITY): Payer: 59 | Admitting: Anesthesiology

## 2012-04-14 DIAGNOSIS — M1710 Unilateral primary osteoarthritis, unspecified knee: Principal | ICD-10-CM | POA: Diagnosis present

## 2012-04-14 DIAGNOSIS — Z885 Allergy status to narcotic agent status: Secondary | ICD-10-CM

## 2012-04-14 DIAGNOSIS — I10 Essential (primary) hypertension: Secondary | ICD-10-CM | POA: Diagnosis present

## 2012-04-14 DIAGNOSIS — K219 Gastro-esophageal reflux disease without esophagitis: Secondary | ICD-10-CM | POA: Diagnosis present

## 2012-04-14 DIAGNOSIS — Z96659 Presence of unspecified artificial knee joint: Secondary | ICD-10-CM

## 2012-04-14 DIAGNOSIS — D500 Iron deficiency anemia secondary to blood loss (chronic): Secondary | ICD-10-CM

## 2012-04-14 DIAGNOSIS — Z87891 Personal history of nicotine dependence: Secondary | ICD-10-CM

## 2012-04-14 DIAGNOSIS — Z90710 Acquired absence of both cervix and uterus: Secondary | ICD-10-CM

## 2012-04-14 DIAGNOSIS — F329 Major depressive disorder, single episode, unspecified: Secondary | ICD-10-CM | POA: Diagnosis present

## 2012-04-14 DIAGNOSIS — E663 Overweight: Secondary | ICD-10-CM

## 2012-04-14 DIAGNOSIS — F3289 Other specified depressive episodes: Secondary | ICD-10-CM | POA: Diagnosis present

## 2012-04-14 DIAGNOSIS — Z6829 Body mass index (BMI) 29.0-29.9, adult: Secondary | ICD-10-CM

## 2012-04-14 DIAGNOSIS — D62 Acute posthemorrhagic anemia: Secondary | ICD-10-CM | POA: Diagnosis not present

## 2012-04-14 DIAGNOSIS — Z9660 Presence of unspecified orthopedic joint implant: Secondary | ICD-10-CM

## 2012-04-14 HISTORY — PX: TOTAL KNEE ARTHROPLASTY: SHX125

## 2012-04-14 LAB — TYPE AND SCREEN
ABO/RH(D): A NEG
Antibody Screen: NEGATIVE

## 2012-04-14 SURGERY — ARTHROPLASTY, KNEE, TOTAL
Anesthesia: General | Site: Knee | Laterality: Left | Wound class: Clean

## 2012-04-14 MED ORDER — ALPRAZOLAM 0.25 MG PO TABS
0.2500 mg | ORAL_TABLET | Freq: Two times a day (BID) | ORAL | Status: DC | PRN
  Administered 2012-04-14 – 2012-04-15 (×2): 0.25 mg via ORAL
  Filled 2012-04-14 (×2): qty 1

## 2012-04-14 MED ORDER — FLUOXETINE HCL 40 MG PO CAPS: 40.0000 mg | ORAL_CAPSULE | Freq: Every day | ORAL | Status: DC

## 2012-04-14 MED ORDER — HYDROMORPHONE HCL PF 1 MG/ML IJ SOLN
0.2000 mg | INTRAMUSCULAR | Status: DC | PRN
  Filled 2012-04-14: qty 1

## 2012-04-14 MED ORDER — BUPIVACAINE IN DEXTROSE 0.75-8.25 % IT SOLN
INTRATHECAL | Status: DC | PRN
  Administered 2012-04-14: 2 mL via INTRATHECAL

## 2012-04-14 MED ORDER — ALTEPLASE (STROKE) FULL DOSE INFUSION: 0.9000 mg/kg | Freq: Once | INTRAVENOUS | Status: DC

## 2012-04-14 MED ORDER — CEFAZOLIN SODIUM-DEXTROSE 2-3 GM-% IV SOLR
2.0000 g | Freq: Four times a day (QID) | INTRAVENOUS | Status: AC
  Administered 2012-04-14 (×2): 2 g via INTRAVENOUS
  Filled 2012-04-14 (×2): qty 50

## 2012-04-14 MED ORDER — HYDROMORPHONE HCL PF 1 MG/ML IJ SOLN
0.5000 mg | INTRAMUSCULAR | Status: DC | PRN
  Administered 2012-04-14 – 2012-04-15 (×6): 1 mg via INTRAVENOUS
  Filled 2012-04-14 (×6): qty 1

## 2012-04-14 MED ORDER — MENTHOL 3 MG MT LOZG: 1.0000 | LOZENGE | OROMUCOSAL | Status: DC | PRN

## 2012-04-14 MED ORDER — SODIUM CHLORIDE 0.9 % IR SOLN
Status: DC | PRN
  Administered 2012-04-14: 1000 mL

## 2012-04-14 MED ORDER — 0.9 % SODIUM CHLORIDE (POUR BTL) OPTIME
TOPICAL | Status: DC | PRN
  Administered 2012-04-14: 1000 mL

## 2012-04-14 MED ORDER — CHLORHEXIDINE GLUCONATE 4 % EX LIQD
60.0000 mL | Freq: Once | CUTANEOUS | Status: DC
  Filled 2012-04-14: qty 60

## 2012-04-14 MED ORDER — DEXAMETHASONE SODIUM PHOSPHATE 10 MG/ML IJ SOLN
10.0000 mg | Freq: Once | INTRAMUSCULAR | Status: AC
  Administered 2012-04-14: 10 mg via INTRAVENOUS

## 2012-04-14 MED ORDER — SENNA 8.6 MG PO TABS
1.0000 | ORAL_TABLET | Freq: Two times a day (BID) | ORAL | Status: DC
  Administered 2012-04-14 – 2012-04-16 (×4): 8.6 mg via ORAL
  Filled 2012-04-14 (×4): qty 1

## 2012-04-14 MED ORDER — DOCUSATE SODIUM 100 MG PO CAPS
100.0000 mg | ORAL_CAPSULE | Freq: Two times a day (BID) | ORAL | Status: DC
  Administered 2012-04-14 – 2012-04-16 (×4): 100 mg via ORAL

## 2012-04-14 MED ORDER — FLUTICASONE PROPIONATE 50 MCG/ACT NA SUSP
2.0000 | Freq: Every day | NASAL | Status: DC
  Filled 2012-04-14: qty 16

## 2012-04-14 MED ORDER — PROPOFOL 10 MG/ML IV EMUL
INTRAVENOUS | Status: DC | PRN
  Administered 2012-04-14: 100 ug/kg/min via INTRAVENOUS

## 2012-04-14 MED ORDER — BUPIVACAINE-EPINEPHRINE 0.25% -1:200000 IJ SOLN
INTRAMUSCULAR | Status: AC
  Filled 2012-04-14: qty 1

## 2012-04-14 MED ORDER — DIPHENHYDRAMINE HCL 12.5 MG/5ML PO ELIX: 25.0000 mg | ORAL_SOLUTION | Freq: Four times a day (QID) | ORAL | Status: DC | PRN

## 2012-04-14 MED ORDER — PHENOL 1.4 % MT LIQD: 1.0000 | OROMUCOSAL | Status: DC | PRN

## 2012-04-14 MED ORDER — KETOROLAC TROMETHAMINE 30 MG/ML IJ SOLN
INTRAMUSCULAR | Status: AC
  Filled 2012-04-14: qty 1

## 2012-04-14 MED ORDER — POLYETHYLENE GLYCOL 3350 17 G PO PACK: 17.0000 g | PACK | Freq: Every day | ORAL | Status: DC | PRN

## 2012-04-14 MED ORDER — FLUOXETINE HCL 20 MG PO CAPS
40.0000 mg | ORAL_CAPSULE | Freq: Every day | ORAL | Status: DC
  Administered 2012-04-15 – 2012-04-16 (×2): 40 mg via ORAL
  Filled 2012-04-14 (×2): qty 2

## 2012-04-14 MED ORDER — HYDROCODONE-ACETAMINOPHEN 7.5-325 MG PO TABS
1.0000 | ORAL_TABLET | ORAL | Status: DC
  Administered 2012-04-14 – 2012-04-15 (×5): 2 via ORAL
  Administered 2012-04-15: 1 via ORAL
  Administered 2012-04-15: 2 via ORAL
  Administered 2012-04-15: 1 via ORAL
  Administered 2012-04-15 – 2012-04-16 (×4): 2 via ORAL
  Filled 2012-04-14 (×12): qty 2

## 2012-04-14 MED ORDER — LISINOPRIL-HYDROCHLOROTHIAZIDE 10-12.5 MG PO TABS: 1.0000 | ORAL_TABLET | Freq: Every morning | ORAL | Status: DC

## 2012-04-14 MED ORDER — LISINOPRIL 10 MG PO TABS
10.0000 mg | ORAL_TABLET | Freq: Every day | ORAL | Status: DC
  Administered 2012-04-14 – 2012-04-16 (×3): 10 mg via ORAL
  Filled 2012-04-14 (×3): qty 1

## 2012-04-14 MED ORDER — ALUM & MAG HYDROXIDE-SIMETH 200-200-20 MG/5ML PO SUSP: 30.0000 mL | ORAL | Status: DC | PRN

## 2012-04-14 MED ORDER — ACETAMINOPHEN 10 MG/ML IV SOLN
INTRAVENOUS | Status: AC
  Filled 2012-04-14: qty 100

## 2012-04-14 MED ORDER — PROMETHAZINE HCL 25 MG/ML IJ SOLN: 6.2500 mg | INTRAMUSCULAR | Status: DC | PRN

## 2012-04-14 MED ORDER — ACETAMINOPHEN 10 MG/ML IV SOLN
INTRAVENOUS | Status: DC | PRN
  Administered 2012-04-14: 1000 mg via INTRAVENOUS

## 2012-04-14 MED ORDER — METHOCARBAMOL 100 MG/ML IJ SOLN
500.0000 mg | Freq: Four times a day (QID) | INTRAMUSCULAR | Status: DC | PRN
  Administered 2012-04-14 (×2): 500 mg via INTRAVENOUS
  Filled 2012-04-14 (×3): qty 5

## 2012-04-14 MED ORDER — METHOCARBAMOL 500 MG PO TABS
500.0000 mg | ORAL_TABLET | Freq: Four times a day (QID) | ORAL | Status: DC | PRN
  Administered 2012-04-15 (×4): 500 mg via ORAL
  Filled 2012-04-14 (×4): qty 1

## 2012-04-14 MED ORDER — MIDAZOLAM HCL 5 MG/5ML IJ SOLN
INTRAMUSCULAR | Status: DC | PRN
  Administered 2012-04-14: 2 mg via INTRAVENOUS

## 2012-04-14 MED ORDER — PROPOFOL 10 MG/ML IV EMUL
INTRAVENOUS | Status: DC | PRN
  Administered 2012-04-14: 20 mg via INTRAVENOUS

## 2012-04-14 MED ORDER — EPHEDRINE SULFATE 50 MG/ML IJ SOLN
INTRAMUSCULAR | Status: DC | PRN
  Administered 2012-04-14 (×2): 5 mg via INTRAVENOUS

## 2012-04-14 MED ORDER — ONDANSETRON HCL 4 MG/2ML IJ SOLN: 4.0000 mg | Freq: Four times a day (QID) | INTRAMUSCULAR | Status: DC | PRN

## 2012-04-14 MED ORDER — KETOROLAC TROMETHAMINE 30 MG/ML IJ SOLN
INTRAMUSCULAR | Status: DC | PRN
  Administered 2012-04-14: 30 mg

## 2012-04-14 MED ORDER — ONDANSETRON HCL 4 MG PO TABS: 4.0000 mg | ORAL_TABLET | Freq: Four times a day (QID) | ORAL | Status: DC | PRN

## 2012-04-14 MED ORDER — BUPIVACAINE-EPINEPHRINE PF 0.25-1:200000 % IJ SOLN
INTRAMUSCULAR | Status: DC | PRN
  Administered 2012-04-14: 50 mL

## 2012-04-14 MED ORDER — RIVAROXABAN 10 MG PO TABS
10.0000 mg | ORAL_TABLET | Freq: Every day | ORAL | Status: DC
  Administered 2012-04-15 – 2012-04-16 (×2): 10 mg via ORAL
  Filled 2012-04-14 (×3): qty 1

## 2012-04-14 MED ORDER — ONDANSETRON HCL 4 MG/2ML IJ SOLN
INTRAMUSCULAR | Status: DC | PRN
  Administered 2012-04-14: 4 mg via INTRAVENOUS

## 2012-04-14 MED ORDER — CEFAZOLIN SODIUM-DEXTROSE 2-3 GM-% IV SOLR
INTRAVENOUS | Status: AC
  Filled 2012-04-14: qty 50

## 2012-04-14 MED ORDER — FERROUS SULFATE 325 (65 FE) MG PO TABS
325.0000 mg | ORAL_TABLET | Freq: Three times a day (TID) | ORAL | Status: DC
  Administered 2012-04-14 – 2012-04-16 (×5): 325 mg via ORAL
  Filled 2012-04-14 (×9): qty 1

## 2012-04-14 MED ORDER — TRANEXAMIC ACID 100 MG/ML IV SOLN
15.0000 mg/kg | Freq: Once | INTRAVENOUS | Status: AC
  Administered 2012-04-14: 1203 mg via INTRAVENOUS
  Filled 2012-04-14: qty 12.03

## 2012-04-14 MED ORDER — LACTATED RINGERS IV SOLN
INTRAVENOUS | Status: DC | PRN
  Administered 2012-04-14 (×3): via INTRAVENOUS

## 2012-04-14 MED ORDER — FENTANYL CITRATE 0.05 MG/ML IJ SOLN
INTRAMUSCULAR | Status: DC | PRN
  Administered 2012-04-14: 50 ug via INTRAVENOUS

## 2012-04-14 MED ORDER — SODIUM CHLORIDE 0.9 % IV SOLN
INTRAVENOUS | Status: DC
  Administered 2012-04-14 – 2012-04-15 (×3): via INTRAVENOUS
  Filled 2012-04-14 (×8): qty 1000

## 2012-04-14 MED ORDER — CEFAZOLIN SODIUM-DEXTROSE 2-3 GM-% IV SOLR
2.0000 g | INTRAVENOUS | Status: AC
  Administered 2012-04-14: 2 g via INTRAVENOUS

## 2012-04-14 MED ORDER — HYDROMORPHONE HCL PF 1 MG/ML IJ SOLN: 0.2500 mg | INTRAMUSCULAR | Status: DC | PRN

## 2012-04-14 MED ORDER — HYDROCHLOROTHIAZIDE 12.5 MG PO CAPS
12.5000 mg | ORAL_CAPSULE | Freq: Every day | ORAL | Status: DC
  Administered 2012-04-14 – 2012-04-16 (×3): 12.5 mg via ORAL
  Filled 2012-04-14 (×3): qty 1

## 2012-04-14 MED ORDER — PANTOPRAZOLE SODIUM 40 MG PO TBEC
80.0000 mg | DELAYED_RELEASE_TABLET | Freq: Every day | ORAL | Status: DC
  Administered 2012-04-15 – 2012-04-16 (×2): 80 mg via ORAL
  Filled 2012-04-14 (×3): qty 2

## 2012-04-14 SURGICAL SUPPLY — 64 items
BAG ZIPLOCK 12X15 (MISCELLANEOUS) ×2 IMPLANT
BANDAGE ELASTIC 6 VELCRO ST LF (GAUZE/BANDAGES/DRESSINGS) ×2 IMPLANT
BANDAGE ESMARK 6X9 LF (GAUZE/BANDAGES/DRESSINGS) ×1 IMPLANT
BLADE SAW SGTL 13.0X1.19X90.0M (BLADE) ×2 IMPLANT
BNDG ESMARK 6X9 LF (GAUZE/BANDAGES/DRESSINGS) ×2
BOWL SMART MIX CTS (DISPOSABLE) ×2 IMPLANT
CEMENT HV SMART SET (Cement) ×4 IMPLANT
CLOTH BEACON ORANGE TIMEOUT ST (SAFETY) ×2 IMPLANT
CUFF TOURN SGL QUICK 34 (TOURNIQUET CUFF) ×1
CUFF TRNQT CYL 34X4X40X1 (TOURNIQUET CUFF) ×1 IMPLANT
DECANTER SPIKE VIAL GLASS SM (MISCELLANEOUS) ×2 IMPLANT
DERMABOND ADVANCED (GAUZE/BANDAGES/DRESSINGS) ×1
DERMABOND ADVANCED .7 DNX12 (GAUZE/BANDAGES/DRESSINGS) ×1 IMPLANT
DRAPE EXTREMITY T 121X128X90 (DRAPE) ×2 IMPLANT
DRAPE POUCH INSTRU U-SHP 10X18 (DRAPES) ×2 IMPLANT
DRAPE U-SHAPE 47X51 STRL (DRAPES) ×2 IMPLANT
DRSG AQUACEL AG ADV 3.5X10 (GAUZE/BANDAGES/DRESSINGS) ×2 IMPLANT
DRSG TEGADERM 4X4.75 (GAUZE/BANDAGES/DRESSINGS) ×2 IMPLANT
DURAPREP 26ML APPLICATOR (WOUND CARE) ×2 IMPLANT
ELECT REM PT RETURN 9FT ADLT (ELECTROSURGICAL) ×2
ELECTRODE REM PT RTRN 9FT ADLT (ELECTROSURGICAL) ×1 IMPLANT
EVACUATOR 1/8 PVC DRAIN (DRAIN) ×2 IMPLANT
FACESHIELD LNG OPTICON STERILE (SAFETY) ×10 IMPLANT
GAUZE SPONGE 2X2 8PLY STRL LF (GAUZE/BANDAGES/DRESSINGS) ×1 IMPLANT
GLOVE BIOGEL PI IND STRL 6.5 (GLOVE) ×1 IMPLANT
GLOVE BIOGEL PI IND STRL 7.0 (GLOVE) ×1 IMPLANT
GLOVE BIOGEL PI IND STRL 7.5 (GLOVE) IMPLANT
GLOVE BIOGEL PI IND STRL 8 (GLOVE) ×1 IMPLANT
GLOVE BIOGEL PI INDICATOR 6.5 (GLOVE) ×1
GLOVE BIOGEL PI INDICATOR 7.0 (GLOVE) ×1
GLOVE BIOGEL PI INDICATOR 7.5 (GLOVE)
GLOVE BIOGEL PI INDICATOR 8 (GLOVE) ×1
GLOVE ECLIPSE 8.0 STRL XLNG CF (GLOVE) ×2 IMPLANT
GLOVE ORTHO TXT STRL SZ7.5 (GLOVE) ×4 IMPLANT
GLOVE SURG SS PI 6.5 STRL IVOR (GLOVE) ×4 IMPLANT
GLOVE SURG SS PI 7.5 STRL IVOR (GLOVE) ×6 IMPLANT
GOWN BRE IMP PREV XXLGXLNG (GOWN DISPOSABLE) IMPLANT
GOWN STRL NON-REIN LRG LVL3 (GOWN DISPOSABLE) ×6 IMPLANT
GOWN STRL REIN XL XLG (GOWN DISPOSABLE) ×4 IMPLANT
HANDPIECE INTERPULSE COAX TIP (DISPOSABLE) ×1
IMMOBILIZER KNEE 20 (SOFTGOODS) ×2
IMMOBILIZER KNEE 20 THIGH 36 (SOFTGOODS) ×1 IMPLANT
KIT BASIN OR (CUSTOM PROCEDURE TRAY) ×2 IMPLANT
MANIFOLD NEPTUNE II (INSTRUMENTS) ×2 IMPLANT
NDL SAFETY ECLIPSE 18X1.5 (NEEDLE) ×1 IMPLANT
NEEDLE HYPO 18GX1.5 SHARP (NEEDLE) ×1
NS IRRIG 1000ML POUR BTL (IV SOLUTION) ×2 IMPLANT
PACK TOTAL JOINT (CUSTOM PROCEDURE TRAY) ×2 IMPLANT
POSITIONER SURGICAL ARM (MISCELLANEOUS) ×2 IMPLANT
SET HNDPC FAN SPRY TIP SCT (DISPOSABLE) ×1 IMPLANT
SET PAD KNEE POSITIONER (MISCELLANEOUS) ×2 IMPLANT
SPONGE GAUZE 2X2 STER 10/PKG (GAUZE/BANDAGES/DRESSINGS) ×1
SUCTION FRAZIER 12FR DISP (SUCTIONS) ×2 IMPLANT
SUT MNCRL AB 4-0 PS2 18 (SUTURE) ×2 IMPLANT
SUT VIC AB 1 CT1 36 (SUTURE) ×2 IMPLANT
SUT VIC AB 2-0 CT1 27 (SUTURE) ×2
SUT VIC AB 2-0 CT1 TAPERPNT 27 (SUTURE) ×2 IMPLANT
SUT VLOC 180 0 24IN GS25 (SUTURE) ×2 IMPLANT
SYR 50ML LL SCALE MARK (SYRINGE) ×2 IMPLANT
TOWEL OR 17X26 10 PK STRL BLUE (TOWEL DISPOSABLE) ×2 IMPLANT
TOWEL OR NON WOVEN STRL DISP B (DISPOSABLE) ×2 IMPLANT
TRAY FOLEY CATH 14FRSI W/METER (CATHETERS) ×2 IMPLANT
WATER STERILE IRR 1500ML POUR (IV SOLUTION) ×4 IMPLANT
WRAP KNEE MAXI GEL POST OP (GAUZE/BANDAGES/DRESSINGS) ×2 IMPLANT

## 2012-04-14 NOTE — Anesthesia Preprocedure Evaluation (Addendum)
Anesthesia Evaluation  Patient identified by MRN, date of birth, ID band Patient awake    Reviewed: Allergy & Precautions, H&P , NPO status , Patient's Chart, lab work & pertinent test results  Airway Mallampati: II TM Distance: >3 FB Neck ROM: Full    Dental No notable dental hx.    Pulmonary neg pulmonary ROS,  breath sounds clear to auscultation  Pulmonary exam normal       Cardiovascular hypertension, Pt. on medications Rhythm:Regular Rate:Normal     Neuro/Psych PSYCHIATRIC DISORDERS Depression negative neurological ROS     GI/Hepatic Neg liver ROS, GERD-  Medicated,  Endo/Other  negative endocrine ROS  Renal/GU negative Renal ROS  negative genitourinary   Musculoskeletal negative musculoskeletal ROS (+)   Abdominal   Peds negative pediatric ROS (+)  Hematology negative hematology ROS (+)   Anesthesia Other Findings   Reproductive/Obstetrics negative OB ROS                           Anesthesia Physical Anesthesia Plan  ASA: II  Anesthesia Plan: Spinal   Post-op Pain Management:    Induction: Intravenous  Airway Management Planned:   Additional Equipment:   Intra-op Plan:   Post-operative Plan: Extubation in OR  Informed Consent: I have reviewed the patients History and Physical, chart, labs and discussed the procedure including the risks, benefits and alternatives for the proposed anesthesia with the patient or authorized representative who has indicated his/her understanding and acceptance.   Dental advisory given  Plan Discussed with: CRNA  Anesthesia Plan Comments: (Discussed risks/benefits of spinal including headache, backache, failure, bleeding, infection, and nerve damage. Patient consents to spinal. Questions answered. Coagulation studies and platelet count acceptable. Platelet count is high. She denies knowledge of this and no history of bleeding. Will discuss  followup/repeat with Dr. Charlann Boxer. Had spinal for other TKA a few years ago and did well.)      Anesthesia Quick Evaluation

## 2012-04-14 NOTE — Progress Notes (Signed)
Clinical Social Work Department BRIEF PSYCHOSOCIAL ASSESSMENT 04/14/2012  Patient:  [name], [name]     Account Number:  [id number]     Admit date:  04/14/2012  Clinical Social Worker:  Candie Chroman  Date/Time:  04/14/2012 11:57 AM  Referred by:  Physician  Date Referred:  04/14/2012 Referred for  SNF Placement   Other Referral:   Interview type:  Patient Other interview type:    PSYCHOSOCIAL DATA Living Status:  OTHER Admitted from facility:   Level of care:   Primary support name:  Gerome Apley Primary support relationship to patient:  SPOUSE Degree of support available:   supportive    CURRENT CONCERNS  Other Concerns:    SOCIAL WORK ASSESSMENT / PLAN Pt is a 56 yr old female living at home prior to hospitalization. CSW met with pt / family to assist with d/c planning. Pt has made prior arrangements to have ST Rehab at [facility] following hospital d/c. D/C plan has been confirmed with SNF. CSW will continue to follow to assist with d/c planning to SNF.   Assessment/plan status:  Psychosocial Support/Ongoing Assessment of Needs Other assessment/ plan:   Information/referral to community resources:   None needed at this time.    PATIENT'S/FAMILY'S RESPONSE TO PLAN OF CARE: Pt is looking forward to rehab at [facility].   Cori Razor LCSW [phone]

## 2012-04-14 NOTE — Interval H&P Note (Signed)
History and Physical Interval Note:  04/14/2012 7:01 AM  [name]  has presented today for surgery, with the diagnosis of left knee osteoarthritis  The various methods of treatment have been discussed with the patient and family. After consideration of risks, benefits and other options for treatment, the patient has consented to  Procedure(s) (LRB) with comments: TOTAL KNEE ARTHROPLASTY (Left) as a surgical intervention .  The patient's history has been reviewed, patient examined, no change in status, stable for surgery.  I have reviewed the patient's chart and labs.  Questions were answered to the patient's satisfaction.     [name]

## 2012-04-14 NOTE — Op Note (Signed)
NAME:  [name]                      MEDICAL RECORD NO.:  [id number]                             FACILITY:  [facility]      PHYSICIAN:  Madlyn Frankel. Charlann Boxer, M.D.  DATE OF BIRTH:  1956/04/21      DATE OF PROCEDURE:  04/14/2012                                     OPERATIVE REPORT         PREOPERATIVE DIAGNOSIS:  Left knee osteoarthritis.      POSTOPERATIVE DIAGNOSIS:  Left knee osteoarthritis.      FINDINGS:  The patient was noted to have complete loss of cartilage and   bone-on-bone arthritis with associated osteophytes in the medial and patellofemoral compartments of   the knee with a significant synovitis and associated effusion.      PROCEDURE:  Left total knee replacement.      COMPONENTS USED:  DePuy rotating platform posterior stabilized knee   system, a size 3 femur, 3 tibia, 10 mm PS insert, and 38 patellar   button.      SURGEON:  Madlyn Frankel. Charlann Boxer, M.D.      ASSISTANT:  Leilani Able, PA-C.      ANESTHESIA:  Spinal.      SPECIMENS:  None.      COMPLICATION:  None.      DRAINS:  One Hemovac.  EBL: <100cc      TOURNIQUET TIME:   Total Tourniquet Time Documented: Thigh (Left) - 30 minutes .      The patient was stable to the recovery room.      INDICATION FOR PROCEDURE:  [name] is a 55 y.o. female patient of   mine.  The patient had been seen, evaluated, and treated conservatively in the   office with medication, activity modification, and injections.  The patient had   radiographic changes of bone-on-bone arthritis with endplate sclerosis and osteophytes noted.      The patient failed conservative measures including medication, injections, and activity modification, and at this point was ready for more definitive measures.   Based on the radiographic changes and failed conservative measures, the patient   decided to proceed with total knee replacement.  Risks of infection,   DVT, component failure, need for revision surgery, postop course, and   expectations were all   discussed and reviewed.  Consent was obtained for benefit of pain   relief.      PROCEDURE IN DETAIL:  The patient was brought to the operative theater.   Once adequate anesthesia, preoperative antibiotics, 2 gm of Ancef administered, the patient was positioned supine with the left thigh tourniquet placed.  The  left lower extremity was prepped and draped in sterile fashion.  A time-   out was performed identifying the patient, planned procedure, and   extremity.      The left lower extremity was placed in the [facility] leg holder.  The leg was   exsanguinated, tourniquet elevated to 250 mmHg.  A midline incision was   made followed by median parapatellar arthrotomy.  Following initial   exposure, attention was first directed to the patella.  Precut  measurement was noted to be 20 mm.  I resected down to 14 mm and used a   38 patellar button to restore patellar height as well as cover the cut   surface.      The lug holes were drilled and a metal shim was placed to protect the   patella from retractors and saw blades.      At this point, attention was now directed to the femur.  The femoral   canal was opened with a drill, irrigated to try to prevent fat emboli.  An   intramedullary rod was passed at 3 degrees valgus, 10 mm of bone was   resected off the distal femur.  Following this resection, the tibia was   subluxated anteriorly.  Using the extramedullary guide, 10 mm of bone was resected off   the proximal lateral tibia.  We confirmed the gap would be   stable medially and laterally with a 10 mm insert as well as confirmed   the cut was perpendicular in the coronal plane, checking with an alignment rod.      Once this was done, I sized the femur to be a size 3 in the anterior-   posterior dimension, chose a standard component based on medial and   lateral dimension.  The size 3 rotation block was then pinned in   position anterior referenced using the  C-clamp to set rotation.  The   anterior, posterior, and  chamfer cuts were made without difficulty nor   notching making certain that I was along the anterior cortex to help   with flexion gap stability.      The final box cut was made off the lateral aspect of distal femur.      At this point, the tibia was sized to be a size 3, the size 3 tray was   then pinned in position through the medial third of the tubercle,   drilled, and keel punched.  Trial reduction was now carried with a 3 femur,  3 tibia, a 10 mm insert, and the 38 patella botton.  The knee was brought to   extension, full extension with good flexion stability with the patella   tracking through the trochlea without application of pressure.  Given   all these findings, the trial components removed.  Final components were   opened and cement was mixed.  The knee was irrigated with normal saline   solution and pulse lavage.  The synovial lining was   then injected with 0.25% Marcaine with epinephrine and 1 cc of Toradol,   total of 61 cc.      The knee was irrigated.  Final implants were then cemented onto clean and   dried cut surfaces of bone with the knee brought to extension with a 10mm trial insert.      Once the cement had fully cured, the excess cement was removed   throughout the knee.  I confirmed I was satisfied with the range of   motion and stability, and the final 10 mm PS insert to match the size 3 femur was chosen.  It was   placed into the knee.      The tourniquet had been let down at 31 minutes.  No significant   hemostasis required.  The medium Hemovac drain was placed deep.  The   extensor mechanism was then reapproximated using #1 Vicryl with the knee   in flexion.  The   remaining wound was  closed with 2-0 Vicryl and running 4-0 Monocryl.   The knee was cleaned, dried, dressed sterilely using Dermabond and   Aquacel dressing.  Drain site dressed separately.  The patient was then   brought to  recovery room in stable condition, tolerating the procedure   well.   Please note that Physician Assistant, Leilani Able, was present for the entirety of the case, and was utilized for pre-operative positioning, peri-operative retractor management, general facilitation of the procedure.  He was also utilized for primary wound closure at the end of the case.              Madlyn Frankel Charlann Boxer, M.D.

## 2012-04-14 NOTE — Plan of Care (Signed)
Problem: Consults Goal: Diagnosis- Total Joint Replacement Primary Total Knee     

## 2012-04-14 NOTE — Anesthesia Postprocedure Evaluation (Signed)
  Anesthesia Post-op Note  Patient: [name]  Procedure(s) Performed: Procedure(s) (LRB): TOTAL KNEE ARTHROPLASTY (Left)  Patient Location: PACU  Anesthesia Type: Spinal  Level of Consciousness: awake and alert   Airway and Oxygen Therapy: Patient Spontanous Breathing  Post-op Pain: mild  Post-op Assessment: Post-op Vital signs reviewed, Patient's Cardiovascular Status Stable, Respiratory Function Stable, Patent Airway and No signs of Nausea or vomiting  Post-op Vital Signs: stable  Complications: No apparent anesthesia complications.

## 2012-04-14 NOTE — Evaluation (Signed)
Physical Therapy Evaluation Patient Details Name: [name] MRN: [id number] DOB: May 17, 1956 Today's Date: 04/14/2012 Time: 4098-1191 PT Time Calculation (min): 23 min  PT Assessment / Plan / Recommendation Clinical Impression  56 yo female s/p L TKA. Mobilizing fairly well. On eval, pt required Min Assist for mobility. Plan is for ST rehab at discharge. Recommend SNF to improve functional mobility and to maximize independence.     PT Assessment  Patient needs continued PT services    Follow Up Recommendations  SNF    Does the patient have the potential to tolerate intense rehabilitation      Barriers to Discharge        Equipment Recommendations       Recommendations for Other Services OT consult   Frequency 7X/week    Precautions / Restrictions Precautions Precautions: Knee Required Braces or Orthoses: Knee Immobilizer - Left Knee Immobilizer - Left: Discontinue once straight leg raise with < 10 degree lag Restrictions Weight Bearing Restrictions: No LLE Weight Bearing: Weight bearing as tolerated   Pertinent Vitals/Pain L knee-"sore/a big toothache in my leg"      Mobility  Bed Mobility Bed Mobility: Supine to Sit Supine to Sit: 4: Min assist Details for Bed Mobility Assistance: Assist for L LE off bed.  Transfers Transfers: Sit to Stand;Stand to Sit Sit to Stand: 4: Min assist;From bed Stand to Sit: 4: Min assist;To chair/3-in-1;With armrests Details for Transfer Assistance: VCs safety, technique, hand placement. Assist to rise, stabilize, control descent.  Ambulation/Gait Ambulation/Gait Assistance: 4: Min assist Ambulation Distance (Feet): 60 Feet Assistive device: Rolling walker Ambulation/Gait Assistance Details: VCs safety, technique, sequence. Assist to stabilize throughout ambulation.  Gait Pattern: Antalgic;Decreased stride length;Step-to pattern;Decreased step length - left;Decreased step length - right    Shoulder Instructions       Exercises     PT Diagnosis: Difficulty walking;Abnormality of gait;Acute pain  PT Problem List: Decreased strength;Decreased range of motion;Decreased activity tolerance;Decreased mobility;Pain;Decreased knowledge of use of DME PT Treatment Interventions: DME instruction;Gait training;Functional mobility training;Therapeutic activities;Therapeutic exercise;Patient/family education   PT Goals Acute Rehab PT Goals PT Goal Formulation: With patient Time For Goal Achievement: 04/21/12 Potential to Achieve Goals: Good Pt will go Supine/Side to Sit: with supervision PT Goal: Supine/Side to Sit - Progress: Goal set today Pt will go Sit to Supine/Side: with supervision PT Goal: Sit to Supine/Side - Progress: Goal set today Pt will go Sit to Stand: with supervision PT Goal: Sit to Stand - Progress: Goal set today Pt will Ambulate: >150 feet;with supervision;with least restrictive assistive device PT Goal: Ambulate - Progress: Goal set today  Visit Information  Last PT Received On: 04/14/12 Assistance Needed: +1    Subjective Data  Subjective: "yeah, I'm headed to [facility] for [facility]" Patient Stated Goal: Rehab   Prior Functioning  Home Living Lives With: Spouse Type of Home: House Home Access: Stairs to enter Secretary/administrator of Steps: 4 Entrance Stairs-Rails: Right Home Layout: Two level Home Adaptive Equipment: Environmental consultant - four wheeled Additional Comments: Pt planning to d/c to [facility] Prior Function Level of Independence: Independent Able to Take Stairs?: Yes Driving: Yes Communication Communication: No difficulties    Cognition  Overall Cognitive Status: Appears within functional limits for tasks assessed/performed Arousal/Alertness: Awake/alert Orientation Level: Appears intact for tasks assessed Behavior During Session: [facility] for tasks performed    Extremity/Trunk Assessment Right Lower Extremity Assessment RLE ROM/Strength/Tone: [facility] for tasks  assessed Left Lower Extremity Assessment LLE ROM/Strength/Tone: Deficits LLE ROM/Strength/Tone Deficits: SLR  2+/5, fair quad set, moves ankle well LLE Sensation: WFL - Light Touch Trunk Assessment Trunk Assessment: Normal   Balance    End of Session PT - End of Session Activity Tolerance: Patient tolerated treatment well Patient left: in chair;with call bell/phone within reach  GP     [name] [facility] 04/14/2012, 3:45 PM [phone]

## 2012-04-14 NOTE — Anesthesia Procedure Notes (Signed)
Spinal  Patient location during procedure: OR Staffing Anesthesiologist: Azell Der Performed by: anesthesiologist  Preanesthetic Checklist Completed: patient identified, site marked, surgical consent, pre-op evaluation, timeout performed, IV checked, risks and benefits discussed and monitors and equipment checked Spinal Block Patient position: sitting Prep: ChloraPrep Patient monitoring: heart rate, cardiac monitor, continuous pulse ox and blood pressure Approach: midline Location: L3-4 Injection technique: single-shot Needle Needle type: Sprotte  Needle gauge: 24 G Additional Notes Clear CSF obtained. No paresthesia. Tolerated well. Adequate level.

## 2012-04-14 NOTE — Progress Notes (Signed)
Clinical Social Work Department CLINICAL SOCIAL WORK PLACEMENT NOTE 04/14/2012  Patient:  LADAJA, YUSUPOV  Account Number:  [id number] Admit date:  04/14/2012  Clinical Social Worker:  Cori Razor, LCSW  Date/time:  04/14/2012 12:05 PM  Clinical Social Work is seeking post-discharge placement for this patient at the following level of care:   SKILLED NURSING   (*CSW will update this form in Epic as items are completed)     Patient/family provided with [facility] Department of Clinical Social Work's list of facilities offering this level of care within the geographic area requested by the patient (or if unable, by the patient's family).    Patient/family informed of their freedom to choose among providers that offer the needed level of care, that participate in Medicare, Medicaid or managed care program needed by the patient, have an available bed and are willing to accept the patient.    Patient/family informed of [facility]' ownership interest in [facility], as well as of the fact that they are under no obligation to receive care at this facility.  PASARR submitted to [employer] on  PASARR number received from [employer] on 10/03/2010  FL2 transmitted to all facilities in geographic area requested by pt/family on  04/14/2012 FL2 transmitted to all facilities within larger geographic area on   Patient informed that his/her managed care company has contracts with or will negotiate with  certain facilities, including the following:     Patient/family informed of bed offers received:  04/14/2012 Patient chooses bed at [facility] Physician recommends and patient chooses bed at    Patient to be transferred to [facility] Re[facility] on   Patient to be transferred to facility by   The following physician request were entered in Epic:   Additional Comments: Pt has [facility] insurance which requires prior auth. CSW / SNF will assist with authorization process.    Cori Razor  LCSW [phone]

## 2012-04-14 NOTE — Progress Notes (Signed)
Utilization review completed.  

## 2012-04-14 NOTE — Preoperative (Signed)
Beta Blockers   Reason not to administer Beta Blockers:Not Applicable 

## 2012-04-14 NOTE — Transfer of Care (Signed)
Immediate Anesthesia Transfer of Care Note  Patient: [name]  Procedure(s) Performed: Procedure(s) (LRB) with comments: TOTAL KNEE ARTHROPLASTY (Left)  Patient Location: PACU  Anesthesia Type:MAC and Spinal  Level of Consciousness: awake, alert , oriented and patient cooperative  Airway & Oxygen Therapy: Patient Spontanous Breathing and Patient connected to face mask oxygen  Post-op Assessment: Report given to PACU RN and Post -op Vital signs reviewed and stable  Post vital signs: Reviewed and stable  Complications: No apparent anesthesia complications

## 2012-04-15 ENCOUNTER — Encounter (HOSPITAL_COMMUNITY): Payer: Self-pay | Admitting: Orthopedic Surgery

## 2012-04-15 LAB — CBC
HCT: 29.7 % — ABNORMAL LOW (ref 36.0–46.0)
Hemoglobin: 10.2 g/dL — ABNORMAL LOW (ref 12.0–15.0)
MCHC: 34.3 g/dL (ref 30.0–36.0)
RDW: 13 % (ref 11.5–15.5)
WBC: 15.3 10*3/uL — ABNORMAL HIGH (ref 4.0–10.5)

## 2012-04-15 LAB — BASIC METABOLIC PANEL
BUN: 14 mg/dL (ref 6–23)
Chloride: 99 mEq/L (ref 96–112)
GFR calc Af Amer: >90 mL/min (ref >90)
GFR calc non Af Amer: >90 mL/min (ref >90)
Potassium: 3.7 mEq/L (ref 3.5–5.1)
Sodium: 135 mEq/L (ref 135–145)

## 2012-04-15 MED ORDER — ZOLPIDEM TARTRATE 5 MG PO TABS
5.0000 mg | ORAL_TABLET | Freq: Every evening | ORAL | Status: DC | PRN
  Administered 2012-04-15: 5 mg via ORAL
  Filled 2012-04-15: qty 1

## 2012-04-15 MED ORDER — CELECOXIB 200 MG PO CAPS
200.0000 mg | ORAL_CAPSULE | Freq: Two times a day (BID) | ORAL | Status: DC
  Administered 2012-04-15 – 2012-04-16 (×2): 200 mg via ORAL
  Filled 2012-04-15 (×3): qty 1

## 2012-04-15 NOTE — Progress Notes (Signed)
Physical Therapy Treatment Patient Details Name: [name] MRN: [id number] DOB: 1955-10-24 Today's Date: 04/15/2012 Time: 4098-1191 PT Time Calculation (min): 24 min  PT Assessment / Plan / Recommendation Comments on Treatment Session  Experiencing increased pain this pm session compared to am session. SNF for continued rehab (possibly tomorrow)    Follow Up Recommendations  SNF     Does the patient have the potential to tolerate intense rehabilitation     Barriers to Discharge        Equipment Recommendations  3 in 1 bedside comode;None recommended by PT    Recommendations for Other Services    Frequency 7X/week   Plan Discharge plan remains appropriate    Precautions / Restrictions Precautions Precautions: Knee Required Braces or Orthoses:  (KI DC'd on 04/15/12 (able to SLR)) Restrictions Weight Bearing Restrictions: No LLE Weight Bearing: Weight bearing as tolerated   Pertinent Vitals/Pain 7/10 L knee with activity; 4/10 at rest    Mobility  Bed Mobility Bed Mobility: Sit to Supine Supine to Sit: 4: Min assist Details for Bed Mobility Assistance: Assist for L LE onto bed Transfers Transfers: Sit to Stand;Stand to Sit Sit to Stand: 4: Min guard;From chair/3-in-1 Stand to Sit: 4: Min guard;To bed Details for Transfer Assistance: VCs for pt to extend L LE forward before sitting, hand placement.  Ambulation/Gait Ambulation/Gait Assistance: 4: Min guard Ambulation Distance (Feet): 140 Feet Assistive device: Rolling walker Ambulation/Gait Assistance Details: Pt beginning to use reciprocal gait pattern.  Gait Pattern: Step-to pattern;Step-through pattern;Decreased stride length    Exercises Total Joint Exercises Ankle Circles/Pumps: AROM;Both;10 reps;Supine Quad Sets: AROM;Both;10 reps;Supine Short Arc Quad: AROM;Left;10 reps;Supine Heel Slides: AAROM;Left;10 reps;Supine Hip ABduction/ADduction: AROM;Left;10 reps;Supine Straight Leg Raises: AROM;Left;10  reps;Supine   PT Diagnosis:    PT Problem List:   PT Treatment Interventions:     PT Goals Acute Rehab PT Goals Pt will go Sit to Supine/Side: with supervision PT Goal: Sit to Supine/Side - Progress: Progressing toward goal Pt will go Sit to Stand: with supervision PT Goal: Sit to Stand - Progress: Progressing toward goal Pt will Ambulate: >150 feet;with supervision;with least restrictive assistive device PT Goal: Ambulate - Progress: Progressing toward goal  Visit Information  Last PT Received On: 04/15/12 Assistance Needed: +1    Subjective Data  Subjective: "It's (knee) so stiff now" Patient Stated Goal: Rehab possibly tomorrow   Cognition  Overall Cognitive Status: Appears within functional limits for tasks assessed/performed Arousal/Alertness: Awake/alert Orientation Level: Appears intact for tasks assessed Behavior During Session: [facility] for tasks performed    Balance  Balance Balance Assessed: Yes Dynamic Standing Balance Dynamic Standing - Level of Assistance: 5: Stand by assistance  End of Session PT - End of Session Activity Tolerance: Patient limited by pain Patient left: in bed;with call bell/phone within reach;with family/visitor present   GP     [name] [facility] 04/15/2012, 2:28 PM [phone]

## 2012-04-15 NOTE — Evaluation (Signed)
Occupational Therapy Evaluation Patient Details Name: [name] MRN: [id number] DOB: [birth year] Today's Date: 04/15/2012 Time: 8295-6213 OT Time Calculation (min): 33 min  OT Assessment / Plan / Recommendation Clinical Impression  Pt is s/p L TKA and is doing well with ADL. Pt planning for SNF at discharge. Pt displays some pain, decreased functional reach to L foot and overall decreased independence with self care tasks. Will benefit from skilled OT services to improve ADL independence.     OT Assessment  Patient needs continued OT Services    Follow Up Recommendations  SNF    Barriers to Discharge      Equipment Recommendations  3 in 1 bedside comode;None recommended by PT    Recommendations for Other Services    Frequency  Min 2X/week    Precautions / Restrictions Precautions Precautions: Knee Required Braces or Orthoses:  (pt able to SLR with PT) Restrictions Weight Bearing Restrictions: No LLE Weight Bearing: Weight bearing as tolerated        ADL  Eating/Feeding: Simulated;Independent Where Assessed - Eating/Feeding: Chair Grooming: Simulated;Wash/dry hands Where Assessed - Grooming: Supported sitting Upper Body Bathing: Performed;Chest;Right arm;Left arm;Abdomen;Set up Where Assessed - Upper Body Bathing: Unsupported sitting Lower Body Bathing: Simulated;Minimal assistance;Other (comment) (for L foot) Where Assessed - Lower Body Bathing: Supported sit to stand Upper Body Dressing: Simulated;Set up Where Assessed - Upper Body Dressing: Unsupported sitting Lower Body Dressing: Simulated;Minimal assistance Where Assessed - Lower Body Dressing: Supported sit to stand Toilet Transfer: Simulated;Min Pension scheme manager Method: Sit to stand Toileting - Architect and Hygiene: Simulated;Min guard Where Assessed - Engineer, mining and Hygiene: Sit to stand from 3-in-1 or toilet Tub/Shower Transfer Method: Not assessed Equipment Used:  Rolling walker ADL Comments: Pt doing well. States this knee surgery is going better than it did when she had the R one done. She is almost able to reach fully to L foot to do own sock, etc.     OT Diagnosis: Generalized weakness  OT Problem List: Decreased strength;Decreased range of motion;Decreased knowledge of use of DME or AE;Pain OT Treatment Interventions: Self-care/ADL training;Therapeutic activities;DME and/or AE instruction;Patient/family education   OT Goals Acute Rehab OT Goals OT Goal Formulation: With patient Time For Goal Achievement: 04/22/12 Potential to Achieve Goals: Good ADL Goals Pt Will Perform Grooming: with supervision;Standing at sink ADL Goal: Grooming - Progress: Goal set today Pt Will Perform Lower Body Bathing: with supervision;Sit to stand from chair;Sit to stand from bed ADL Goal: Lower Body Bathing - Progress: Goal set today Pt Will Perform Lower Body Dressing: with supervision;Sit to stand from chair;Sit to stand from bed ADL Goal: Lower Body Dressing - Progress: Goal set today Pt Will Transfer to Toilet: with supervision;Ambulation;with DME;3-in-1 ADL Goal: Toilet Transfer - Progress: Goal set today Pt Will Perform Toileting - Clothing Manipulation: with supervision;Standing ADL Goal: Toileting - Clothing Manipulation - Progress: Goal set today  Visit Information  Last OT Received On: 04/15/12 Assistance Needed: +1    Subjective Data  Subjective: I am just working on my bath Patient Stated Goal: wants to go to rehab asap so she can return home asap   Prior Functioning     Home Living Lives With: Spouse Available Help at Discharge: Skilled Nursing Facility Type of Home: House Home Access: Stairs to enter Secretary/administrator of Steps: 4 Entrance Stairs-Rails: Right Home Layout: Two level Home Adaptive Equipment: Environmental consultant - four wheeled;Shower chair with back Additional Comments: Pt planning to d/c to [facility] Prior  Function Level of  Independence: Independent Able to Take Stairs?: Yes Driving: Yes Communication Communication: No difficulties         Vision/Perception     Cognition  Overall Cognitive Status: Appears within functional limits for tasks assessed/performed Arousal/Alertness: Awake/alert Orientation Level: Appears intact for tasks assessed Behavior During Session: [facility] for tasks performed    Extremity/Trunk Assessment Right Upper Extremity Assessment RUE ROM/Strength/Tone: [facility] for tasks assessed Left Upper Extremity Assessment LUE ROM/Strength/Tone: WFL for tasks assessed     Mobility Bed Mobility Bed Mobility: Supine to Sit Supine to Sit: 4: Min assist Details for Bed Mobility Assistance: Assist for L LE off bed.  Transfers Transfers: Sit to Stand;Stand to Sit Sit to Stand: 4: Min guard;With upper extremity assist;From chair/3-in-1 Stand to Sit: 4: Min guard;With upper extremity assist;To chair/3-in-1 Details for Transfer Assistance: min vebal cues for hand placement     Shoulder Instructions     Exercise Total Joint Exercises Ankle Circles/Pumps: AROM;Both;10 reps;Supine Quad Sets: AROM;Both;10 reps;Supine Short Arc Quad: AROM;Left;10 reps;Supine Heel Slides: AROM;Left;10 reps;Supine Hip ABduction/ADduction: AROM;Left;10 reps;Supine Straight Leg Raises: AROM;Left;10 reps;Supine   Balance Balance Balance Assessed: Yes Dynamic Standing Balance Dynamic Standing - Level of Assistance: 5: Stand by assistance   End of Session OT - End of Session Activity Tolerance: Patient tolerated treatment well Patient left: in chair;with call bell/phone within reach  GO     Lennox Laity [phone] 04/15/2012, 11:12 AM

## 2012-04-15 NOTE — Progress Notes (Signed)
Physical Therapy Treatment Patient Details Name: [name] MRN: [id number] DOB: 1955/07/15 Today's Date: 04/15/2012 Time: 4098-1191 PT Time Calculation (min): 25 min  PT Assessment / Plan / Recommendation Comments on Treatment Session  Progressing very well. Moderate pain level. SNF for continued rehab.     Follow Up Recommendations  SNF     Does the patient have the potential to tolerate intense rehabilitation     Barriers to Discharge        Equipment Recommendations  None recommended by PT    Recommendations for Other Services    Frequency 7X/week   Plan Discharge plan remains appropriate    Precautions / Restrictions Precautions Precautions: Knee Required Braces or Orthoses:  (DC'd KI on 04/15/12) Restrictions Weight Bearing Restrictions: No LLE Weight Bearing: Weight bearing as tolerated   Pertinent Vitals/Pain 6/10 L knee    Mobility  Bed Mobility Bed Mobility: Supine to Sit Supine to Sit: 4: Min assist Details for Bed Mobility Assistance: Assist for L LE off bed.  Transfers Transfers: Sit to Stand;Stand to Sit Sit to Stand: 4: Min guard;From bed Stand to Sit: 4: Min assist;With armrests;To chair/3-in-1 Details for Transfer Assistance: VCS safety, to slide L LE forward when sitting. Assist to control descent.  Ambulation/Gait Ambulation/Gait Assistance: 4: Min guard Ambulation Distance (Feet): 140 Feet Assistive device: Rolling walker Ambulation/Gait Assistance Details: VCS safety, sequence.  Gait Pattern: Antalgic;Decreased stride length    Exercises Total Joint Exercises Ankle Circles/Pumps: AROM;Both;10 reps;Supine Quad Sets: AROM;Both;10 reps;Supine Short Arc Quad: AROM;Left;10 reps;Supine Heel Slides: AROM;Left;10 reps;Supine Hip ABduction/ADduction: AROM;Left;10 reps;Supine Straight Leg Raises: AROM;Left;10 reps;Supine   PT Diagnosis:    PT Problem List:   PT Treatment Interventions:     PT Goals Acute Rehab PT Goals Pt will go  Supine/Side to Sit: with supervision PT Goal: Supine/Side to Sit - Progress: Progressing toward goal Pt will go Sit to Stand: with supervision PT Goal: Sit to Stand - Progress: Progressing toward goal Pt will Ambulate: >150 feet;with supervision;with least restrictive assistive device PT Goal: Ambulate - Progress: Progressing toward goal  Visit Information  Last PT Received On: 04/15/12 Assistance Needed: +1    Subjective Data  Subjective: "This one is a lot better than the last one" Patient Stated Goal: Rehab   Cognition  Overall Cognitive Status: Appears within functional limits for tasks assessed/performed Arousal/Alertness: Awake/[name] Orientation Level: Appears intact for tasks assessed Behavior During Session: [facility] for tasks performed    Balance     End of Session PT - End of Session Activity Tolerance: Patient tolerated treatment well Patient left: in chair;with call bell/phone within reach   GP     [name] [facility] 04/15/2012, 9:24 AM [phone]

## 2012-04-16 DIAGNOSIS — E663 Overweight: Secondary | ICD-10-CM

## 2012-04-16 DIAGNOSIS — D500 Iron deficiency anemia secondary to blood loss (chronic): Secondary | ICD-10-CM

## 2012-04-16 LAB — BASIC METABOLIC PANEL
BUN: 12 mg/dL (ref 6–23)
CO2: 30 mEq/L (ref 19–32)
Chloride: 98 mEq/L (ref 96–112)
Glucose, Bld: 93 mg/dL (ref 70–99)
Potassium: 3.5 mEq/L (ref 3.5–5.1)
Sodium: 136 mEq/L (ref 135–145)

## 2012-04-16 LAB — CBC
HCT: 28.5 % — ABNORMAL LOW (ref 36.0–46.0)
Hemoglobin: 9.6 g/dL — ABNORMAL LOW (ref 12.0–15.0)
MCHC: 33.7 g/dL (ref 30.0–36.0)
RBC: 3.07 MIL/uL — ABNORMAL LOW (ref 3.87–5.11)

## 2012-04-16 MED ORDER — CELECOXIB 200 MG PO CAPS: 200.0000 mg | ORAL_CAPSULE | Freq: Two times a day (BID) | ORAL | Status: DC

## 2012-04-16 MED ORDER — FERROUS SULFATE 325 (65 FE) MG PO TABS: 325.0000 mg | ORAL_TABLET | Freq: Three times a day (TID) | ORAL | Status: DC

## 2012-04-16 MED ORDER — METHOCARBAMOL 500 MG PO TABS: 500.0000 mg | ORAL_TABLET | Freq: Four times a day (QID) | ORAL | Status: DC | PRN

## 2012-04-16 MED ORDER — POLYETHYLENE GLYCOL 3350 17 G PO PACK: 17.0000 g | PACK | Freq: Two times a day (BID) | ORAL | Status: DC

## 2012-04-16 MED ORDER — ASPIRIN EC 325 MG PO TBEC: 325.0000 mg | DELAYED_RELEASE_TABLET | Freq: Two times a day (BID) | ORAL | Status: DC

## 2012-04-16 MED ORDER — DSS 100 MG PO CAPS: 100.0000 mg | ORAL_CAPSULE | Freq: Two times a day (BID) | ORAL | Status: DC

## 2012-04-16 MED ORDER — HYDROCODONE-ACETAMINOPHEN 7.5-325 MG PO TABS: 1.0000 | ORAL_TABLET | ORAL | Status: DC | PRN

## 2012-04-16 MED ORDER — ZOLPIDEM TARTRATE 5 MG PO TABS: 5.0000 mg | ORAL_TABLET | Freq: Every evening | ORAL | Status: DC | PRN

## 2012-04-16 NOTE — Progress Notes (Signed)
Physical Therapy Treatment Patient Details Name: [name] MRN: [id number] DOB: 05-07-1956 Today's Date: 04/16/2012 Time: 4098-1191 PT Time Calculation (min): 26 min  PT Assessment / Plan / Recommendation Comments on Treatment Session  Pt to d/c to rehab today. Encourged pt to perform quad sets often during day.     Follow Up Recommendations  SNF     Does the patient have the potential to tolerate intense rehabilitation     Barriers to Discharge        Equipment Recommendations  3 in 1 bedside comode;None recommended by PT    Recommendations for Other Services    Frequency 7X/week   Plan Discharge plan remains appropriate    Precautions / Restrictions Precautions Precautions: Knee Restrictions Weight Bearing Restrictions: No LLE Weight Bearing: Weight bearing as tolerated   Pertinent Vitals/Pain 6/10 L knee with activity; 2/10 L knee at rest    Mobility  Bed Mobility Bed Mobility: Sit to Supine Sit to Supine: 4: Min assist Details for Bed Mobility Assistance: Assist for L LE onto bed.  Transfers Transfers: Sit to Stand;Stand to Sit Sit to Stand: 5: Supervision;From chair/3-in-1 Stand to Sit: 5: Supervision;To bed Details for Transfer Assistance: VCs for pt to extend L LE forward before sitting, hand placement.  Ambulation/Gait Ambulation/Gait Assistance: 5: Supervision Ambulation Distance (Feet): 145 Feet Assistive device: Rolling walker Gait Pattern: Antalgic;Step-to pattern;Step-through pattern;Decreased stride length;Decreased step length - right;Decreased step length - left    Exercises Total Joint Exercises Ankle Circles/Pumps: AROM;Both;15 reps;Supine Quad Sets: AROM;Both;10 reps;Supine Short Arc Quad: AROM;Left;10 reps;Supine Heel Slides: AAROM;Left;10 reps;Supine Hip ABduction/ADduction: AROM;Left;10 reps;Supine Straight Leg Raises: AROM;Left;10 reps;Supine   PT Diagnosis:    PT Problem List:   PT Treatment Interventions:     PT Goals Acute  Rehab PT Goals Pt will go Sit to Supine/Side: with supervision PT Goal: Sit to Supine/Side - Progress: Progressing toward goal Pt will go Sit to Stand: with supervision PT Goal: Sit to Stand - Progress: Met Pt will Ambulate: >150 feet;with supervision PT Goal: Ambulate - Progress: Progressing toward goal  Visit Information  Last PT Received On: 04/16/12 Assistance Needed: +1    Subjective Data  Subjective: "My pain is a little better today" Patient Stated Goal: Rehab today   Cognition  Overall Cognitive Status: Appears within functional limits for tasks assessed/performed Arousal/Alertness: Awake/alert Orientation Level: Appears intact for tasks assessed Behavior During Session: [facility] for tasks performed    Balance     End of Session PT - End of Session Activity Tolerance: Patient tolerated treatment well Patient left: in bed;with call bell/phone within reach   GP     [name] [facility] 04/16/2012, 9:24 AM [phone]

## 2012-04-16 NOTE — Progress Notes (Signed)
Clinical Social Work Department CLINICAL SOCIAL WORK PLACEMENT NOTE 04/16/2012  Patient:  [name], [name]  Account Number:  [id number] Admit date:  04/14/2012  Clinical Social Worker:  Cori Razor, LCSW  Date/time:  04/14/2012 12:05 PM  Clinical Social Work is seeking post-discharge placement for this patient at the following level of care:   SKILLED NURSING   (*CSW will update this form in Epic as items are completed)     Patient/family provided with [facility] Department of Clinical Social Work's list of facilities offering this level of care within the geographic area requested by the patient (or if unable, by the patient's family).    Patient/family informed of their freedom to choose among providers that offer the needed level of care, that participate in Medicare, Medicaid or managed care program needed by the patient, have an available bed and are willing to accept the patient.    Patient/family informed of [facility]' ownership interest in [facility], as well as of the fact that they are under no obligation to receive care at this facility.  PASARR submitted to EDS on  PASARR number received from [employer] on 10/03/2010  FL2 transmitted to all facilities in geographic area requested by pt/family on  04/14/2012 FL2 transmitted to all facilities within larger geographic area on   Patient informed that his/her managed care company has contracts with or will negotiate with  certain facilities, including the following:     Patient/family informed of bed offers received:  04/14/2012 Patient chooses bed at [facility] Physician recommends and patient chooses bed at    Patient to be transferred to [facility] on  04/16/2012 Patient to be transferred to facility by family transport  The following physician request were entered in Epic:   Additional Comments:  Cori Razor LCSW [phone]

## 2012-04-16 NOTE — Progress Notes (Signed)
   Subjective: 2 Days Post-Op Procedure(s) (LRB): TOTAL KNEE ARTHROPLASTY (Left)   Patient reports pain as mild, pain well controlled. No events throughout the night. Ready to be discharged to SNF.  Objective:   VITALS:   Filed Vitals:   04/16/12 0652  BP: 113/69  Pulse: 93  Temp: 98.3 F (36.8 C)  Resp: 16    Neurovascular intact Dorsiflexion/Plantar flexion intact Incision: dressing C/D/I No cellulitis present Compartment soft  LABS  Basename 04/16/12 0410 04/15/12 0445  HGB 9.6* 10.2*  HCT 28.5* 29.7*  WBC 10.2 15.3*  PLT 500* 592*     Basename 04/16/12 0410 04/15/12 0445  NA 136 135  K 3.5 3.7  BUN 12 14  CREATININE 0.66 0.60  GLUCOSE 93 141*     Assessment/Plan: 2 Days Post-Op Procedure(s) (LRB): TOTAL KNEE ARTHROPLASTY (Left) Up with therapy Discharge to SNF  Follow up in 2 weeks at [facility]. Follow-up Information    Follow up with OLIN,Vollie Aaron D in 2 weeks.   Contact information:   [facility] [address] [place] [address] [phone]           Expected ABLA  Treated with iron and will observe  Overweight (BMI 25-29.9) Estimated Body mass index is 29.44 kg/(m^2) as calculated from the following:   Height as of this encounter: 5\' 5" (1.651 m).   Weight as of this encounter: 176 lb 14.4 oz(80.241 kg). Patient also counseled that weight may inhibit the healing process Patient counseled that losing weight will help with future health issues      [name]. [name]   PAC  04/16/2012, 7:22 AM

## 2012-04-16 NOTE — Discharge Summary (Signed)
Physician Discharge Summary  Patient ID: [name] MRN: [id number] DOB/AGE: 56-Jun-1957 56 y.o.  Admit date: 04/14/2012 Discharge date:  04/16/2012  Procedures:  Procedure(s) (LRB): TOTAL KNEE ARTHROPLASTY (Left)  Attending Physician:  Dr. Durene Romans   Admission Diagnoses:   Left knee OA / pain  Discharge Diagnoses:  Principal Problem:  *S/P left TKA Active Problems:  Overweight   Expected blood loss anemia Hypertension   Depression   GERD (gastroesophageal reflux disease)   Arthritis   HPI: [name], 56 y.o. female, has a history of pain and functional disability in the left knee due to arthritis and has failed non-surgical conservative treatments for greater than 12 weeks to includeNSAID's and/or analgesics, corticosteriod injections and activity modification. Onset of symptoms was gradual, starting 5 years ago with gradually worsening course since that time. The patient noted no past surgery on the left knee(s). Patient currently rates pain in the left knee(s) at 8 out of 10 with activity. Patient has worsening of pain with activity and weight bearing, pain that interferes with activities of daily living, crepitus and joint swelling. Patient has evidence of periarticular osteophytes and joint space narrowing by imaging studies. Risks, benefits and expectations were discussed with the patient. Patient understand the risks, benefits and expectations and wishes to proceed with surgery.   PCP: Benita Stabile, MD   Discharged Condition: good  Hospital Course:  Patient underwent the above stated procedure on 04/14/2012. Patient tolerated the procedure well and brought to the recovery room in good condition and subsequently to the floor.  POD #1 BP: 106/69 ; Pulse: 62 ; Temp: 97.7 F (36.5 C) ; Resp: 16  Pt's foley was removed, as well as the hemovac drain removed. IV was changed to a saline lock. Patient reports pain as mild, pain well controlled. Better than  after her last surgery. No events throughout the night. Neurovascular intact, dorsiflexion/plantar flexion intact, incision: dressing C/D/I, no cellulitis present and compartment soft.   LABS  Basename  04/15/12 0445   HGB  10.2  HCT  29.7   POD #2  BP: 113/69 ; Pulse: 93 ; Temp: 98.3 F (36.8 C) ; Resp: 16  Patient reports pain as mild, pain well controlled. No events throughout the night. Ready to be discharged to SNF. Neurovascular intact, dorsiflexion/plantar flexion intact, incision: dressing C/D/I, no cellulitis present and compartment soft.   LABS  Basename  04/16/12 0410   HGB  9.6  HCT  28.5    Discharge Exam: General appearance: alert, cooperative and no distress Extremities: Homans sign is negative, no sign of DVT, no edema, redness or tenderness in the calves or thighs and no ulcers, gangrene or trophic changes  Disposition:   Skilled Nursing Facility with follow up in 2 weeks   Follow-up Information    Follow up with [name], MD. In 2 weeks.   Contact information:   [address], [address] [place] [address] [phone]          Discharge Orders    Future Orders Please Complete By Expires   Diet - low sodium heart healthy      Call MD / Call 911      Comments:   If you experience chest pain or shortness of breath, CALL 911 and be transported to the hospital emergency room.  If you develope a fever above 101 F, pus (white drainage) or increased drainage or redness at the wound, or calf pain,  call your surgeon's office.   Discharge instructions      Comments:   Maintain surgical dressing for 10-14 days, then replace with gauze and tape. Keep the area dry and clean until follow up. Follow up in 2 weeks at [facility]. Call with any questions or concerns.   Constipation Prevention      Comments:   Drink plenty of fluids.  Prune juice may be helpful.  You may use a stool softener, such as Colace (over the  counter) 100 mg twice a day.  Use MiraLax (over the counter) for constipation as needed.   Increase activity slowly as tolerated      Driving restrictions      Comments:   No driving for 4 weeks   TED hose      Comments:   Use stockings (TED hose) for 2 weeks on both leg(s).  You may remove them at night for sleeping.   Change dressing      Comments:   Maintain surgical dressing for 10-14 days, then change the dressing daily with sterile 4 x 4 inch gauze dressing and tape. Keep the area dry and clean.      Current Discharge Medication List    START taking these medications   Details  docusate sodium 100 MG CAPS Take 100 mg by mouth 2 (two) times daily. Qty: 10 capsule    ferrous sulfate 325 (65 FE) MG tablet Take 1 tablet (325 mg total) by mouth 3 (three) times daily after meals.    HYDROcodone-acetaminophen (NORCO) 7.5-325 MG per tablet Take 1-2 tablets by mouth every 4 (four) hours as needed for pain. Qty: 120 tablet, Refills: 0    methocarbamol (ROBAXIN) 500 MG tablet Take 1 tablet (500 mg total) by mouth every 6 (six) hours as needed (muscle spasms). Qty: 50 tablet, Refills: 0    polyethylene glycol (MIRALAX / GLYCOLAX) packet Take 17 g by mouth 2 (two) times daily. Qty: 14 each    zolpidem (AMBIEN) 5 MG tablet Take 1 tablet (5 mg total) by mouth at bedtime as needed for sleep. Qty: 30 tablet      CONTINUE these medications which have CHANGED   Details  aspirin EC 325 MG tablet Take 1 tablet (325 mg total) by mouth 2 (two) times daily. X 4 weeks Qty: 60 tablet, Refills: 0    celecoxib (CELEBREX) 200 MG capsule Take 1 capsule (200 mg total) by mouth 2 (two) times daily. Qty: 60 capsule, Refills: 0      CONTINUE these medications which have NOT CHANGED   Details  ALPRAZolam (XANAX) 0.25 MG tablet Take 0.25 mg by mouth as needed. anxiety    cholecalciferol (VITAMIN D) 1000 UNITS tablet Take 1,000 Units by mouth daily.    esomeprazole (NEXIUM) 40 MG capsule Take 40  mg by mouth daily before breakfast.    FLUoxetine (PROZAC) 40 MG capsule Take 40 mg by mouth daily before breakfast.    fluticasone (FLONASE) 50 MCG/ACT nasal spray Place 2 sprays into the nose daily.    lisinopril-hydrochlorothiazide (PRINZIDE,ZESTORETIC) 10-12.5 MG per tablet Take 1 tablet by mouth every morning.    OVER THE COUNTER MEDICATION Take 2 capsules by mouth as needed. HYDROXYCUT-takes when she is going out to eat a big meal or if she feels she is gaining weight         Signed: Anastasio Auerbach. Antwoin Lackey   PAC  04/16/2012, 9:53 AM

## 2012-04-16 NOTE — Progress Notes (Signed)
Pt d/c to [facility] rehab.

## 2012-04-16 NOTE — Progress Notes (Signed)
   Subjective: 2 Days Post-Op Procedure(s) (LRB): TOTAL KNEE ARTHROPLASTY (Left)   Patient reports pain as mild, pain well controlled. Better than after her last surgery. No events throughout the night.  Objective:   VITALS:   Filed Vitals:   04/15/12   BP: 106/69  Pulse: 62  Temp: 97.7 F (36.5 C)  Resp: 16    Neurovascular intact Dorsiflexion/Plantar flexion intact Incision: dressing C/D/I No cellulitis present Compartment soft  LABS  Basename 04/15/12 0445  HGB 10.2*  HCT 29.7*  WBC 15.3*  PLT 592*     Basename 04/15/12 0445  NA 135  K 3.7  BUN 14  CREATININE 0.60  GLUCOSE 141*     Assessment/Plan: 2 Days Post-Op Procedure(s) (LRB): TOTAL KNEE ARTHROPLASTY (Left) HV drain d/c'ed Foley cath d/c'ed Advance diet Up with therapy D/C IV fluids Discharge to SNF eventually, when ready  Expected ABLA  Treated with iron and will observe  Overweight (BMI 25-29.9) Estimated Body mass index is 29.44 kg/(m^2) as calculated from the following:   Height as of this encounter: 5\' 5" (1.651 m).   Weight as of this encounter: 176 lb 14.4 oz(80.241 kg). Patient also counseled that weight may inhibit the healing process Patient counseled that losing weight will help with future health issues      Anastasio Auerbach. Lucille Witts   PAC  04/16/2012, 7:24 AM

## 2012-04-16 NOTE — Care Management Note (Signed)
    Page 1 of 2   04/16/2012     2:23:50 PM   CARE MANAGEMENT NOTE 04/16/2012  Patient:  [name], [name]   Account Number:  [id number]  Date Initiated:  04/14/2012  Documentation initiated by:  Colleen Can  Subjective/Objective Assessment:   DX LEFT TOTAL KNEE REPLACEMNT     Action/Plan:   SNF rehab   Anticipated DC Date:  04/16/2012   Anticipated DC Plan:  SKILLED NURSING FACILITY  In-house referral  Clinical Social Worker      DC Planning Services  NA      [facility] Choice  NA   Choice offered to / List presented to:  NA   DME arranged  NA      DME agency  NA     HH arranged  NA      HH agency  NA   Status of service:  In process, will continue to follow Medicare Important Message given?  NO (If response is "NO", the following Medicare IM given date fields will be blank) Date Medicare IM given:   Date Additional Medicare IM given:    Discharge Disposition:  SKILLED NURSING FACILITY  Per UR Regulation:  Reviewed for med. necessity/level of care/duration of stay  If discussed at Long Length of Stay Meetings, dates discussed:    Comments:

## 2014-09-05 ENCOUNTER — Encounter: Payer: Self-pay | Admitting: Podiatry

## 2014-09-05 ENCOUNTER — Ambulatory Visit (INDEPENDENT_AMBULATORY_CARE_PROVIDER_SITE_OTHER): Payer: 59 | Admitting: Podiatry

## 2014-09-05 ENCOUNTER — Ambulatory Visit (INDEPENDENT_AMBULATORY_CARE_PROVIDER_SITE_OTHER): Payer: 59

## 2014-09-05 VITALS — BP 140/96 | HR 80 | Resp 12

## 2014-09-05 DIAGNOSIS — R52 Pain, unspecified: Secondary | ICD-10-CM

## 2014-09-05 DIAGNOSIS — M2012 Hallux valgus (acquired), left foot: Secondary | ICD-10-CM | POA: Diagnosis not present

## 2014-09-05 DIAGNOSIS — M2042 Other hammer toe(s) (acquired), left foot: Secondary | ICD-10-CM

## 2014-09-05 NOTE — Progress Notes (Signed)
   Subjective:    Patient ID: [name], female    DOB: 04/16/1956, 59 y.o.   MRN: [id number]  HPI N-SORE, SHarp pain L- LT FOOT 2ND TOE D-10 MONTHS O-SUDDENLY C-WORSE A-SHOES T-TRIED TO WEAR GOOD SHOES   This patient is complaining of discomfort in the second left toe when walking wearing shoes. She is attempted shoe modification which does relieve her symptoms, however, she still has persistent pain the second left toe. In addition she mentions occasional pain in the left great toe joint area.  Review of Systems  All other systems reviewed and are negative.  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 I made patient aware that she can consider additional conservative care including shoe modification padding. In addition surgical treatment could be an option including hammertoe repair with possible repair of HAV deformity. As patient has weightbearing occupation and made her aware that she would have to be off work a minimum of 8 weeks.. Patient has  interested in considering surgical options  Reschedule patient at a mutually convenient time for consultation with Dr. Loreta AveWagner to discuss treatment options with patient in greater detail

## 2014-09-05 NOTE — Patient Instructions (Signed)
Hammer Toes Hammer toes is a condition in which one or more of your toes is permanently flexed. CAUSES  This happens when a muscle imbalance or abnormal bone length makes your small toes buckle. This causes the toe joint to contract and the strong cord-like bands that attach muscles to the bones (tendons) in your toes to shorten.  SIGNS AND SYMPTOMS  Common symptoms of flexible hammer toes include:   A buildup of skin cells (corns). Corns occur where boney bumps come in frequent contact with hard surfaces. For example, where your shoes press and rub.  Irritation.  Inflammation.  Pain.  Limited motion in your toes. DIAGNOSIS  Hammer toes are diagnosed through a physical exam of your toes. During the exam, your health care provider may try to reproduce your symptoms by manipulating your foot. Often, X-ray exams are done to determine the degree of deformity and to make sure that the cause is not a fracture.  TREATMENT  Hammer toes can be treated with corrective surgery. There are several types of surgical procedures that can treat hammer toes. The most common procedures include:  Arthroplasty--A portion of the joint is surgically removed and your toe is straightened. The gap fills in with fibrous tissue. This procedure helps treat pain and deformity and helps restore function.  Fusion--Cartilage between the two bones of the affected joint is taken out and the bones fuse together into one longer bone. This helps keep your toe stable and reduces pain but leaves your toe stiff, yet straight.  Implantation--A portion of your bone is removed and replaced with an implant to restore motion.  Flexor tendon transfers--This procedure repositions the tendons that curl the toes down (flexor tendons). This may be done to release the deforming force that causes your toe to buckle. Several of these procedures require fixing your toe with a pin that is visible at the tip of your toe. The pin keeps the toe  straight during healing. Your health care provider will remove the pin usually within 4-8 weeks after the procedure.  Document Released: 05/24/2000 Document Revised: 06/01/2013 Document Reviewed: 02/01/2013 ExitCare Patient Information 2015 [employer]. This information is not intended to replace advice given to you by your health care provider. Make sure you discuss any questions you have with your health care provider. Bunion (Hallux Valgus) A bony bump (protrusion) on the inside of the foot, at the base of the first toe, is called a bunion (hallux valgus). A bunion causes the first toe to angle toward the other toes. SYMPTOMS   A bony bump on the inside of the foot, causing an outward turning of the first toe. It may also overlap the second toe.  Thickening of the skin (callus) over the bony bump.  Fluid buildup under the callus. Fluid may become red, tender, and swollen (inflamed) with constant irritation or pressure.  Foot pain and stiffness. CAUSES  Many causes exist, including:  Inherited from your family (genetics).  Injury (trauma) forcing the first toe into a position in which it overlaps other toes.  Bunions are also associated with wearing shoes that have a narrow toe box (pointy shoes). RISK INCREASES WITH:  Family history of foot abnormalities, especially bunions.  Arthritis.  Narrow shoes, especially high heels. PREVENTION  Wear shoes with a wide toe box.  Avoid shoes with high heels.  Wear a small pad between the big toe and second toe.  Maintain proper conditioning:  Foot and ankle flexibility.  Muscle strength and endurance. PROGNOSIS    With proper treatment, bunions can typically be cured. Occasionally, surgery is required.  RELATED COMPLICATIONS   Infection of the bunion.  Arthritis of the first toe.  Risks of surgery, including infection, bleeding, injury to nerves (numb toe), recurrent bunion, overcorrection (toe points inward), arthritis of  the big toe, big toe pointing upward, and bone not healing. TREATMENT  Treatment first consists of stopping the activities that aggravate the pain, taking pain medicines, and icing to reduce inflammation and pain. Wear shoes with a wide toe box. Shoes can be modified by a shoe repair person to relieve pressure on the bunion, especially if you cannot find shoes with a wide enough toe box. You may also place a pad with the center cut out in your shoe, to reduce pressure on the bunion. Sometimes, an arch support (orthotic) may reduce pressure on the bunion and alleviate the symptoms. Stretching and strengthening exercises for the muscles of the foot may be useful. You may choose to wear a brace or pad at night to hold the big toe away from the second toe. If non-surgical treatments are not successful, surgery may be needed. Surgery involves removing the overgrown tissue and correcting the position of the first toe, by realigning the bones. Bunion surgery is typically performed on an outpatient basis, meaning you can go home the same day as surgery. The surgery may involve cutting the mid portion of the bone of the first toe, or just cutting and repairing (reconstructing) the ligaments and soft tissues around the first toe.  MEDICATION   If pain medicine is needed, nonsteroidal anti-inflammatory medicines, such as aspirin and ibuprofen, or other minor pain relievers, such as acetaminophen, are often recommended.  Do not take pain medicine for 7 days before surgery.  Prescription pain relievers are usually only prescribed after surgery. Use only as directed and only as much as you need.  Ointments applied to the skin may be helpful. HEAT AND COLD  Cold treatment (icing) relieves pain and reduces inflammation. Cold treatment should be applied for 10 to 15 minutes every 2 to 3 hours for inflammation and pain and immediately after any activity that aggravates your symptoms. Use ice packs or an ice  massage.  Heat treatment may be used prior to performing the stretching and strengthening activities prescribed by your caregiver, physical therapist, or athletic trainer. Use a heat pack or a warm soak. SEEK MEDICAL CARE IF:   Symptoms get worse or do not improve in 2 weeks, despite treatment.  After surgery, you develop fever, increasing pain, redness, swelling, drainage of fluids, bleeding, or increasing warmth around the surgical area.  New, unexplained symptoms develop. (Drugs used in treatment may produce side effects.) Document Released: 05/27/2005 Document Revised: 08/19/2011 Document Reviewed: 09/08/2008 ExitCare Patient Information 2015 [employer]. This information is not intended to replace advice given to you by your health care provider. Make sure you discuss any questions you have with your health care provider.  

## 2014-11-02 ENCOUNTER — Ambulatory Visit: Payer: 59 | Admitting: Podiatry

## 2015-04-24 ENCOUNTER — Other Ambulatory Visit: Payer: Self-pay | Admitting: Family Medicine

## 2015-04-24 DIAGNOSIS — R1013 Epigastric pain: Secondary | ICD-10-CM

## 2015-04-25 ENCOUNTER — Ambulatory Visit
Admission: RE | Admit: 2015-04-25 | Discharge: 2015-04-25 | Disposition: A | Discharge status: Home / Self Care | Payer: 59 | Source: Ambulatory Visit | Attending: Family Medicine | Admitting: Family Medicine

## 2015-04-25 DIAGNOSIS — R1013 Epigastric pain: Secondary | ICD-10-CM

## 2015-05-02 ENCOUNTER — Other Ambulatory Visit: Payer: Self-pay

## 2016-06-25 DIAGNOSIS — M545 Low back pain: Secondary | ICD-10-CM | POA: Diagnosis not present

## 2016-06-25 DIAGNOSIS — M4696 Unspecified inflammatory spondylopathy, lumbar region: Secondary | ICD-10-CM | POA: Diagnosis not present

## 2016-07-02 DIAGNOSIS — M545 Low back pain: Secondary | ICD-10-CM | POA: Diagnosis not present

## 2016-07-02 DIAGNOSIS — M4696 Unspecified inflammatory spondylopathy, lumbar region: Secondary | ICD-10-CM | POA: Diagnosis not present

## 2016-08-01 DIAGNOSIS — M545 Low back pain: Secondary | ICD-10-CM | POA: Diagnosis not present

## 2016-08-16 IMAGING — US US ABDOMEN COMPLETE
1 series · 13 of 25 positions shown · non-contrast
Comparison: None in PACs

CLINICAL DATA: Right upper quadrant and epigastric discomfort with
nausea and vomiting for the past 2 weeks

EXAM:
ULTRASOUND ABDOMEN COMPLETE

[Series 1: us abdomen complete · 0.24mm/px · 13 of 85 slices shown]
[im 1/85]
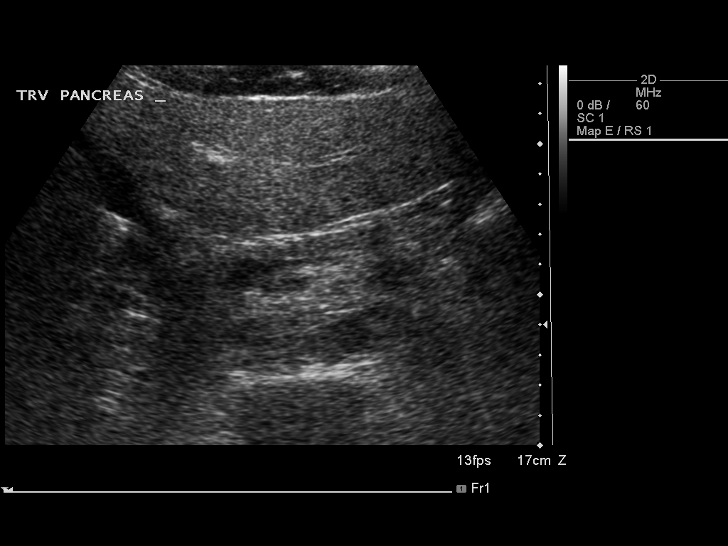
[im 8/85]
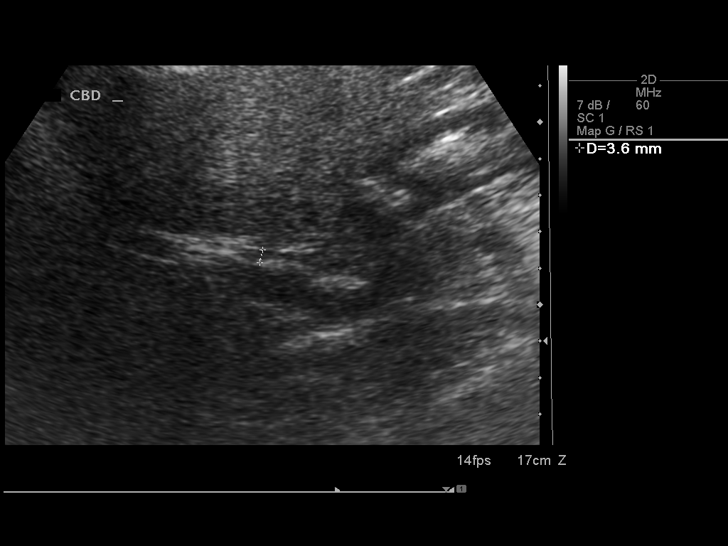
[im 15/85]
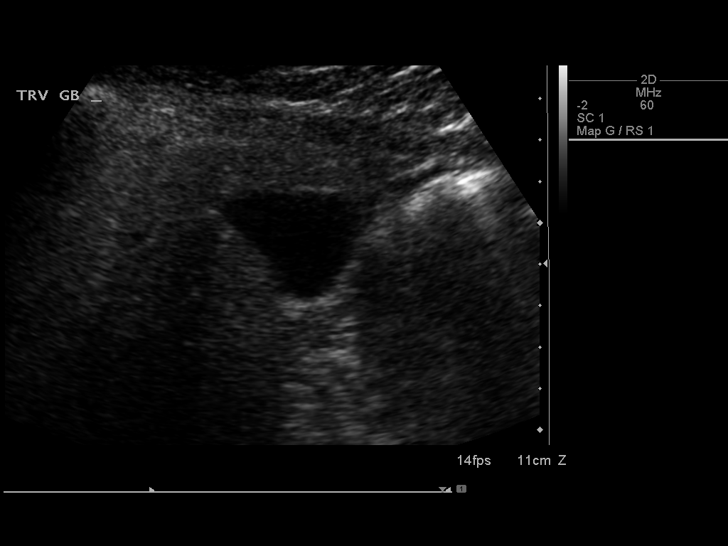
[im 22/85]
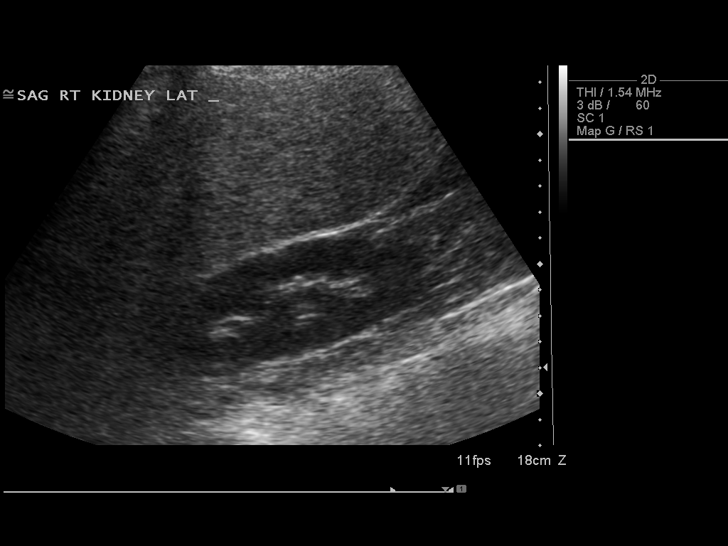
[im 29/85]
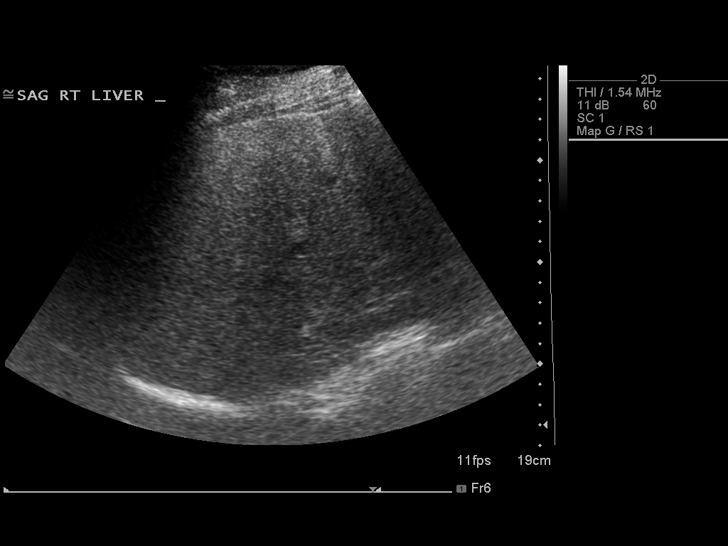
[im 36/85]
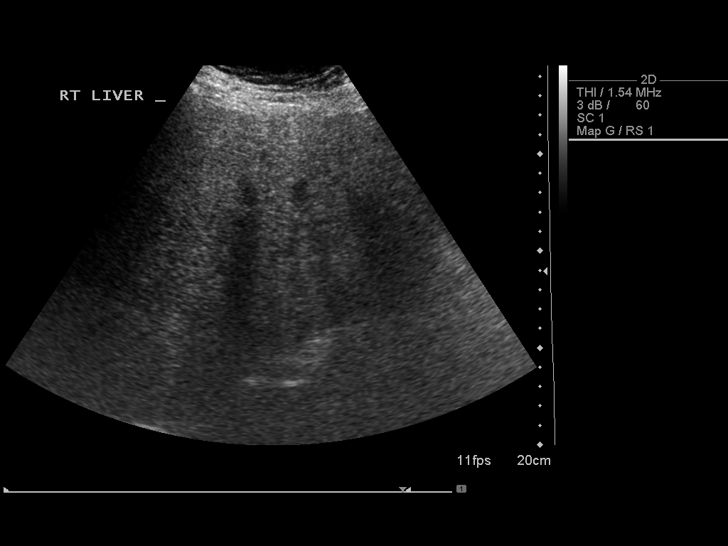
[im 43/85]
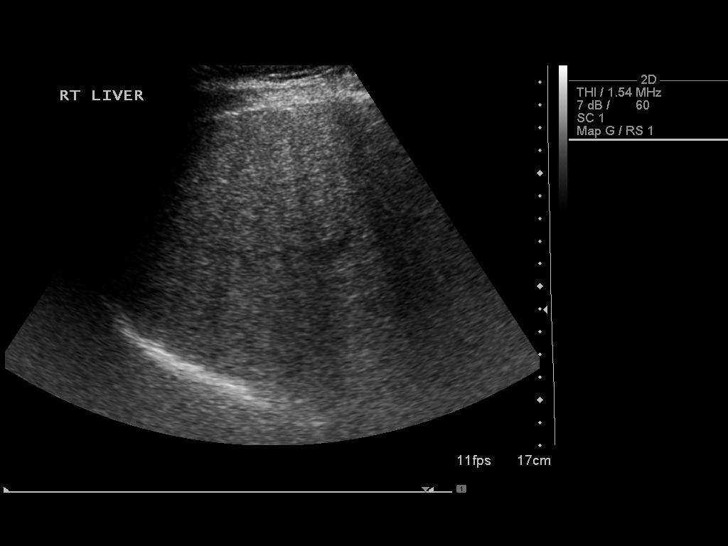
[im 50/85]
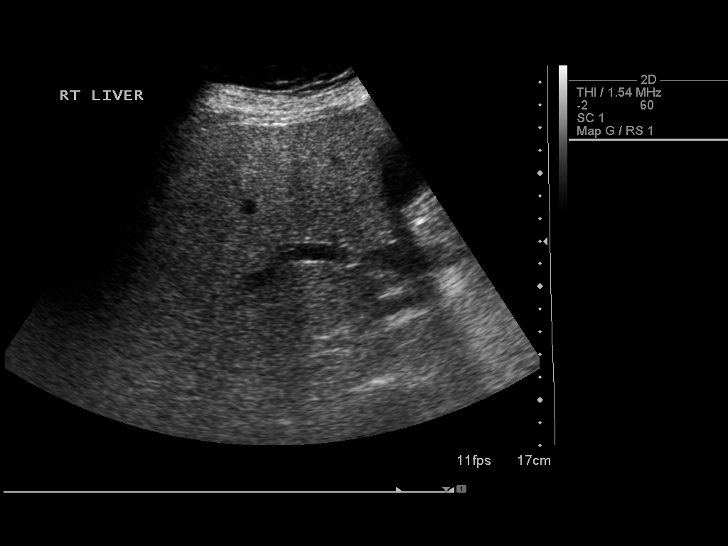
[im 57/85]
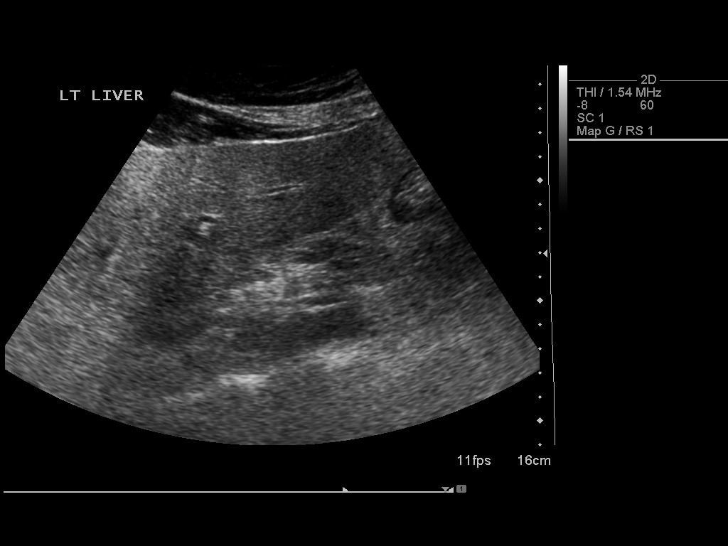
[im 64/85]
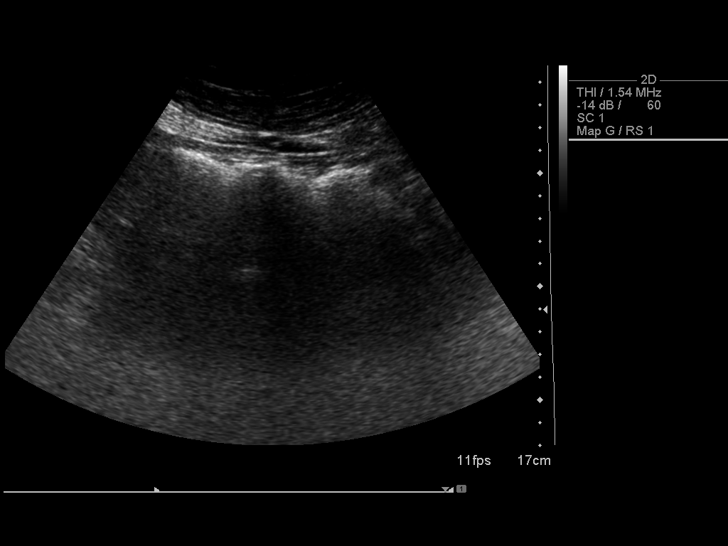
[im 71/85]
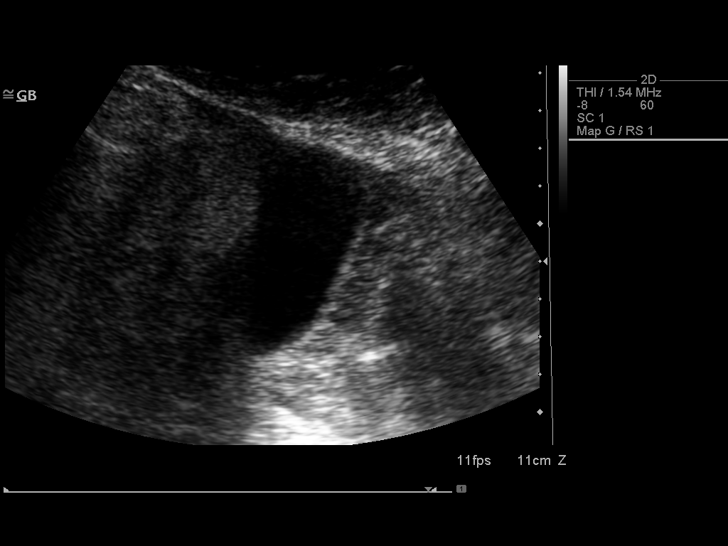
[im 78/85]
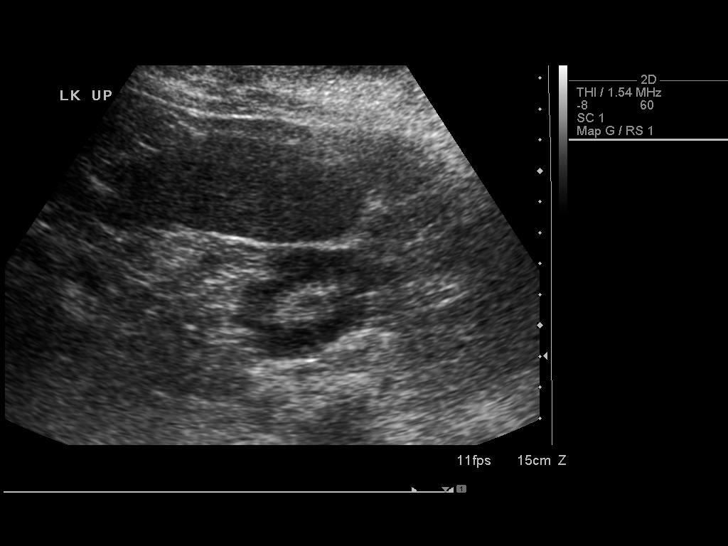
[im 85/85]
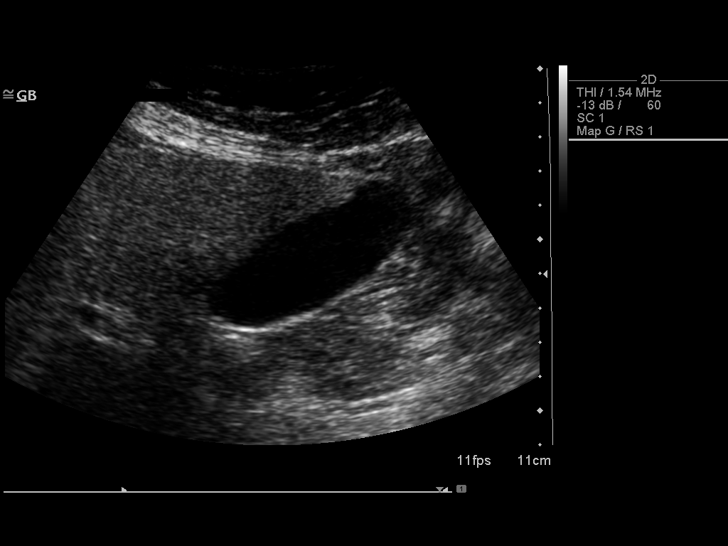

[13 of 25 positions shown; findings below may reference images not displayed]

FINDINGS: Gallbladder: No gallstones or wall thickening visualized. No
sonographic Murphy sign noted.

Common bile duct: Diameter: 3.6 mm

Liver: The hepatic echotexture is heterogeneous and increased. There
is no focal mass or ductal dilation.

IVC: No abnormality visualized.

Pancreas: Visualization of the pancreatic head and tail is limited
due to bowel gas. The pancreatic body is grossly normal.

Spleen: Size and appearance within normal limits.

Right Kidney: Length: 11.2 cm. Echogenicity within normal limits. No
mass or hydronephrosis visualized.

Left Kidney: Length: 11 cm. Echogenicity within normal limits. No
mass or hydronephrosis visualized.

Abdominal aorta: The abdominal aorta exhibits normal caliber. The
iliac arteries were obscured by bowel gas.

Other findings: There is no ascites.
IMPRESSION: 1. No acute gallbladder abnormality. Increased hepatic echotexture
consistent with fatty infiltration. Limited visualization of the
pancreatic head and tail due to bowel gas.
2. No acute abnormality is observed within the abdomen.

## 2016-09-23 DIAGNOSIS — D1800 Hemangioma unspecified site: Secondary | ICD-10-CM | POA: Diagnosis not present

## 2016-09-23 DIAGNOSIS — D225 Melanocytic nevi of trunk: Secondary | ICD-10-CM | POA: Diagnosis not present

## 2016-09-23 DIAGNOSIS — Z86018 Personal history of other benign neoplasm: Secondary | ICD-10-CM | POA: Diagnosis not present

## 2016-09-23 DIAGNOSIS — L814 Other melanin hyperpigmentation: Secondary | ICD-10-CM | POA: Diagnosis not present

## 2016-10-14 DIAGNOSIS — I10 Essential (primary) hypertension: Secondary | ICD-10-CM | POA: Diagnosis not present

## 2016-10-14 DIAGNOSIS — K219 Gastro-esophageal reflux disease without esophagitis: Secondary | ICD-10-CM | POA: Diagnosis not present

## 2016-10-14 DIAGNOSIS — M150 Primary generalized (osteo)arthritis: Secondary | ICD-10-CM | POA: Diagnosis not present

## 2016-10-14 DIAGNOSIS — F419 Anxiety disorder, unspecified: Secondary | ICD-10-CM | POA: Diagnosis not present

## 2016-10-14 DIAGNOSIS — E7800 Pure hypercholesterolemia, unspecified: Secondary | ICD-10-CM | POA: Diagnosis not present

## 2017-02-06 DIAGNOSIS — M545 Low back pain: Secondary | ICD-10-CM | POA: Diagnosis not present

## 2017-02-06 DIAGNOSIS — M4686 Other specified inflammatory spondylopathies, lumbar region: Secondary | ICD-10-CM | POA: Diagnosis not present

## 2017-03-03 DIAGNOSIS — H00019 Hordeolum externum unspecified eye, unspecified eyelid: Secondary | ICD-10-CM | POA: Diagnosis not present

## 2017-03-05 DIAGNOSIS — M533 Sacrococcygeal disorders, not elsewhere classified: Secondary | ICD-10-CM | POA: Diagnosis not present

## 2017-04-08 DIAGNOSIS — Z1231 Encounter for screening mammogram for malignant neoplasm of breast: Secondary | ICD-10-CM | POA: Diagnosis not present

## 2017-04-21 DIAGNOSIS — F419 Anxiety disorder, unspecified: Secondary | ICD-10-CM | POA: Diagnosis not present

## 2017-04-21 DIAGNOSIS — M150 Primary generalized (osteo)arthritis: Secondary | ICD-10-CM | POA: Diagnosis not present

## 2017-04-21 DIAGNOSIS — Z23 Encounter for immunization: Secondary | ICD-10-CM | POA: Diagnosis not present

## 2017-04-21 DIAGNOSIS — I10 Essential (primary) hypertension: Secondary | ICD-10-CM | POA: Diagnosis not present

## 2017-04-21 DIAGNOSIS — K219 Gastro-esophageal reflux disease without esophagitis: Secondary | ICD-10-CM | POA: Diagnosis not present

## 2017-04-21 DIAGNOSIS — E7800 Pure hypercholesterolemia, unspecified: Secondary | ICD-10-CM | POA: Diagnosis not present

## 2017-08-18 DIAGNOSIS — H40053 Ocular hypertension, bilateral: Secondary | ICD-10-CM | POA: Diagnosis not present

## 2017-08-18 DIAGNOSIS — H04123 Dry eye syndrome of bilateral lacrimal glands: Secondary | ICD-10-CM | POA: Diagnosis not present

## 2017-08-18 DIAGNOSIS — H40013 Open angle with borderline findings, low risk, bilateral: Secondary | ICD-10-CM | POA: Diagnosis not present

## 2017-09-22 DIAGNOSIS — D225 Melanocytic nevi of trunk: Secondary | ICD-10-CM | POA: Diagnosis not present

## 2017-09-22 DIAGNOSIS — Z86018 Personal history of other benign neoplasm: Secondary | ICD-10-CM | POA: Diagnosis not present

## 2017-09-22 DIAGNOSIS — L814 Other melanin hyperpigmentation: Secondary | ICD-10-CM | POA: Diagnosis not present

## 2017-09-22 DIAGNOSIS — D1800 Hemangioma unspecified site: Secondary | ICD-10-CM | POA: Diagnosis not present

## 2017-10-01 DIAGNOSIS — M5416 Radiculopathy, lumbar region: Secondary | ICD-10-CM | POA: Diagnosis not present

## 2017-10-03 DIAGNOSIS — M9903 Segmental and somatic dysfunction of lumbar region: Secondary | ICD-10-CM | POA: Diagnosis not present

## 2017-10-03 DIAGNOSIS — M9902 Segmental and somatic dysfunction of thoracic region: Secondary | ICD-10-CM | POA: Diagnosis not present

## 2017-10-03 DIAGNOSIS — M5432 Sciatica, left side: Secondary | ICD-10-CM | POA: Diagnosis not present

## 2017-10-03 DIAGNOSIS — M9904 Segmental and somatic dysfunction of sacral region: Secondary | ICD-10-CM | POA: Diagnosis not present

## 2017-10-06 DIAGNOSIS — M5432 Sciatica, left side: Secondary | ICD-10-CM | POA: Diagnosis not present

## 2017-10-06 DIAGNOSIS — M9902 Segmental and somatic dysfunction of thoracic region: Secondary | ICD-10-CM | POA: Diagnosis not present

## 2017-10-06 DIAGNOSIS — M9904 Segmental and somatic dysfunction of sacral region: Secondary | ICD-10-CM | POA: Diagnosis not present

## 2017-10-06 DIAGNOSIS — M9903 Segmental and somatic dysfunction of lumbar region: Secondary | ICD-10-CM | POA: Diagnosis not present

## 2017-10-08 DIAGNOSIS — K219 Gastro-esophageal reflux disease without esophagitis: Secondary | ICD-10-CM | POA: Diagnosis not present

## 2017-10-08 DIAGNOSIS — M9904 Segmental and somatic dysfunction of sacral region: Secondary | ICD-10-CM | POA: Diagnosis not present

## 2017-10-08 DIAGNOSIS — M150 Primary generalized (osteo)arthritis: Secondary | ICD-10-CM | POA: Diagnosis not present

## 2017-10-08 DIAGNOSIS — M9902 Segmental and somatic dysfunction of thoracic region: Secondary | ICD-10-CM | POA: Diagnosis not present

## 2017-10-08 DIAGNOSIS — M9903 Segmental and somatic dysfunction of lumbar region: Secondary | ICD-10-CM | POA: Diagnosis not present

## 2017-10-08 DIAGNOSIS — F419 Anxiety disorder, unspecified: Secondary | ICD-10-CM | POA: Diagnosis not present

## 2017-10-08 DIAGNOSIS — I10 Essential (primary) hypertension: Secondary | ICD-10-CM | POA: Diagnosis not present

## 2017-10-08 DIAGNOSIS — M5432 Sciatica, left side: Secondary | ICD-10-CM | POA: Diagnosis not present

## 2017-10-09 DIAGNOSIS — M9904 Segmental and somatic dysfunction of sacral region: Secondary | ICD-10-CM | POA: Diagnosis not present

## 2017-10-09 DIAGNOSIS — M9903 Segmental and somatic dysfunction of lumbar region: Secondary | ICD-10-CM | POA: Diagnosis not present

## 2017-10-09 DIAGNOSIS — M9902 Segmental and somatic dysfunction of thoracic region: Secondary | ICD-10-CM | POA: Diagnosis not present

## 2017-10-09 DIAGNOSIS — M5432 Sciatica, left side: Secondary | ICD-10-CM | POA: Diagnosis not present

## 2017-10-13 DIAGNOSIS — M5432 Sciatica, left side: Secondary | ICD-10-CM | POA: Diagnosis not present

## 2017-10-13 DIAGNOSIS — M9903 Segmental and somatic dysfunction of lumbar region: Secondary | ICD-10-CM | POA: Diagnosis not present

## 2017-10-13 DIAGNOSIS — M9904 Segmental and somatic dysfunction of sacral region: Secondary | ICD-10-CM | POA: Diagnosis not present

## 2017-10-13 DIAGNOSIS — M9902 Segmental and somatic dysfunction of thoracic region: Secondary | ICD-10-CM | POA: Diagnosis not present

## 2017-10-14 DIAGNOSIS — M5136 Other intervertebral disc degeneration, lumbar region: Secondary | ICD-10-CM | POA: Diagnosis not present

## 2017-11-24 DIAGNOSIS — R197 Diarrhea, unspecified: Secondary | ICD-10-CM | POA: Diagnosis not present

## 2017-12-15 DIAGNOSIS — N289 Disorder of kidney and ureter, unspecified: Secondary | ICD-10-CM | POA: Diagnosis not present

## 2018-01-26 ENCOUNTER — Encounter: Payer: Self-pay | Admitting: Gastroenterology

## 2018-03-18 ENCOUNTER — Ambulatory Visit (INDEPENDENT_AMBULATORY_CARE_PROVIDER_SITE_OTHER): Payer: BLUE CROSS/BLUE SHIELD | Admitting: Gastroenterology

## 2018-03-18 ENCOUNTER — Encounter: Payer: Self-pay | Admitting: Gastroenterology

## 2018-03-18 ENCOUNTER — Encounter (INDEPENDENT_AMBULATORY_CARE_PROVIDER_SITE_OTHER): Payer: Self-pay

## 2018-03-18 ENCOUNTER — Other Ambulatory Visit: Payer: BLUE CROSS/BLUE SHIELD

## 2018-03-18 VITALS — BP 138/86 | HR 68 | Ht 65.0 in | Wt 213.6 lb

## 2018-03-18 DIAGNOSIS — R198 Other specified symptoms and signs involving the digestive system and abdomen: Secondary | ICD-10-CM

## 2018-03-18 DIAGNOSIS — K529 Noninfective gastroenteritis and colitis, unspecified: Secondary | ICD-10-CM

## 2018-03-18 MED ORDER — PEG-KCL-NACL-NASULF-NA ASC-C 140 G PO SOLR: 140.0000 g | ORAL | 0 refills | Status: DC

## 2018-03-18 NOTE — Patient Instructions (Addendum)
If you are age 62 or older, your body mass index should be between 23-30. Your Body mass index is 35.54 kg/m. If this is out of the aforementioned range listed, please consider follow up with your Primary Care Provider.  If you are age 28 or younger, your body mass index should be between 19-25. Your Body mass index is 35.54 kg/m. If this is out of the aformentioned range listed, please consider follow up with your Primary Care Provider.   You have been scheduled for a colonoscopy. Please follow written instructions given to you at your visit today.  Please pick up your prep supplies at the pharmacy within the next 1-3 days. If you use inhalers (even only as needed), please bring them with you on the day of your procedure. Your physician has requested that you go to www.startemmi.com and enter the access code given to you at your visit today. This web site gives a general overview about your procedure. However, you should still follow specific instructions given to you by our office regarding your preparation for the procedure.  It was a pleasure to see you today!  Dr. Myrtie Neither

## 2018-03-18 NOTE — Progress Notes (Signed)
[facility] Consult Note:  History: [name] 03/18/2018  Referring physician: Asencion [name].Erica Saucer, MD  Reason for consult/chief complaint: Diarrhea (Started in Jan but is not as severe as it use to be , had a colonoscopy 12 years ago that wa normal)   Subjective  HPI:  This is a very pleasant 62 year old woman referred by primary care for chronic diarrhea.  It began around January of this year, and she initially thought it was because of her keto diet.  However, even after stopping that diet the diarrhea continued.  It was multiple watery bowel movements per day for months, and has subsided since then.  She did not have any other clear triggers such as travel, antibiotic use or sick contacts.  At this point, she has 2 or 3 semi-formed stools per day, often with urgency during or soon after eating.  That symptom had been going on long before the diarrhea started, but now is more pronounced.  There is no nocturnal diarrhea or incontinence, but there have been some "close calls" when she had difficulty finding a bathroom after the symptoms occurred.  She denies abdominal pain, nausea, vomiting, dysphagia or unexpected weight loss.  Put about 10 pounds back on since stopping her keto diet.  Primary care notes show elevated BUN and creatinine in June, and she was advised to stay hydrated and stop diclofenac.  She had been on the diclofenac for several months, and was on Celebrex and other NSAIDs prior to that. Repeat renal function in July had normalized.  ROS:  Review of Systems  Constitutional: Positive for fatigue. Negative for appetite change and unexpected weight change.  HENT: Negative for mouth sores and voice change.   Eyes: Negative for pain and redness.  Respiratory: Negative for cough and shortness of breath.   Cardiovascular: Negative for chest pain and palpitations.  Genitourinary: Negative for dysuria and hematuria.  Musculoskeletal: Positive for  arthralgias. Negative for myalgias.  Skin: Negative for pallor and rash.  Neurological: Negative for weakness and headaches.  Hematological: Negative for adenopathy.  Psychiatric/Behavioral:       Anxiety     Past Medical History: Past Medical History:  Diagnosis Date  . Arthritis   . Atrophic vaginitis   . Depression   . GERD (gastroesophageal reflux disease)   . GERD (gastroesophageal reflux disease)   . Hypertension   . Osteoarthritis   . Seasonal allergies   . Sleep apnea      Past Surgical History: Past Surgical History:  Procedure Laterality Date  .  fistula repair x 3  10 yrs ago  . ABDOMINAL HYSTERECTOMY  yrs ago  . JOINT REPLACEMENT  2012   right  . right knee arthroscopic surgery  yrs ago  . right rotator cuff repair  8 yrs   . TOTAL KNEE ARTHROPLASTY  04/14/2012   Procedure: TOTAL KNEE ARTHROPLASTY;  Surgeon: Erica Pal, MD;  Location: [facility];  Service: Orthopedics;  Laterality: Left;   3 procedures for "anal fissure" surgeries  Family History: Family History  Problem Relation Age of Onset  . Diabetes Maternal Grandfather     Social History: Social History   Socioeconomic History  . Marital status: Married    Spouse name: Not on file  . Number of children: Not on file  . Years of education: Not on file  . Highest education level: Not on file  Occupational History  . Not on file  Social Needs  . Financial resource strain: Not  on file  . Food insecurity:    Worry: Not on file    Inability: Not on file  . Transportation needs:    Medical: Not on file    Non-medical: Not on file  Tobacco Use  . Smoking status: Former Smoker    Packs/day: 0.25    Years: 20.00    Pack years: 5.00    Types: Cigarettes    Last attempt to quit: 06/10/2006    Years since quitting: 11.7  . Smokeless tobacco: Never Used  Substance and Sexual Activity  . Alcohol use: Yes    Alcohol/week: 2.0 standard drinks    Types: 2 Glasses of wine per week  . Drug use:  Yes    Types: Marijuana    Comment: marijuan use 30 yrs ago, none current  . Sexual activity: Not on file  Lifestyle  . Physical activity:    Days per week: Not on file    Minutes per session: Not on file  . Stress: Not on file  Relationships  . Social connections:    Talks on phone: Not on file    Gets together: Not on file    Attends religious service: Not on file    Active member of club or organization: Not on file    Attends meetings of clubs or organizations: Not on file    Relationship status: Not on file  Other Topics Concern  . Not on file  Social History Narrative  . Not on file    Allergies: Allergies  Allergen Reactions  . Morphine And Related Itching  . Iodine Rash    Outpatient Meds: Current Outpatient Medications  Medication Sig Dispense Refill  . ALPRAZolam (XANAX) 0.25 MG tablet Take 0.25 mg by mouth as needed. anxiety    . FLUoxetine (PROZAC) 20 MG capsule Take 20 mg by mouth every morning.  3  . HYDROcodone-acetaminophen (NORCO/VICODIN) 5-325 MG per tablet     . lisinopril-hydrochlorothiazide (PRINZIDE,ZESTORETIC) 10-12.5 MG per tablet Take 1 tablet by mouth every morning.    . pantoprazole (PROTONIX) 40 MG tablet Take 40 mg by mouth daily.    . Probiotic Product (PROBIOTIC DAILY PO) Take by mouth.    Marland Kitchen PEG-KCl-NaCl-NaSulf-Na Asc-C (PLENVU) 140 g SOLR Take 140 g by mouth as directed. 1 each 0   No current facility-administered medications for this visit.       ___________________________________________________________________ Objective   Exam:  BP 138/86   Pulse 68   Ht 5\' 5"  (1.651 m)   Wt 213 lb 9.6 oz (96.9 kg)   SpO2 98%   BMI 35.54 kg/m    General: this is a(n) well-appearing woman accompanied by her husband  Eyes: sclera anicteric, no redness  ENT: oral mucosa moist without lesions, no cervical or supraclavicular lymphadenopathy, good dentition  CV: RRR without murmur, S1/S2, no JVD, no peripheral edema  Resp: clear to  auscultation bilaterally, normal RR and effort noted  GI: soft, no tenderness, with active bowel sounds. No guarding or palpable organomegaly noted.  Skin; warm and dry, no rash or jaundice noted  Neuro: awake, alert and oriented x 3. Normal gross motor function and fluent speech  Labs:  11/24/2017: BUN 57, creatinine 1.93 CBC normal except platelets elevated at 638 12/15/2017: BUN 20 creatinine 1.0  No stool studies or imaging  GI pathogen panel appears to been ordered based on the June 2019 office note from primary care, but the patient does not recall having stool studies done.  Assessment: Encounter Diagnoses  Name Primary?  . Chronic diarrhea Yes  . Tenesmus     Chronic diarrhea of unknown cause.  Possible microscopic colitis or IBS.  No apparent risk factors for EPI or SIBO.  Plan:  Stool for C. difficile PCR and ova and parasite Colonoscopy -he was agreeable after discussion of procedure and risks.  The benefits and risks of the planned procedure were described in detail with the patient or (when appropriate) their health care proxy.  Risks were outlined as including, but not limited to, bleeding, infection, perforation, adverse medication reaction leading to cardiac or pulmonary decompensation, or pancreatitis (if ERCP).  The limitation of incomplete mucosal visualization was also discussed.  No guarantees or warranties were given.  As needed use of Imodium in the meantime.  Thank you for the courtesy of this consult.  Please call me with any questions or concerns.  Charlie Pitter III  CC: Clovis Riley, L.Erica Saucer, MD

## 2018-04-01 ENCOUNTER — Other Ambulatory Visit: Payer: BLUE CROSS/BLUE SHIELD

## 2018-04-01 DIAGNOSIS — K529 Noninfective gastroenteritis and colitis, unspecified: Secondary | ICD-10-CM

## 2018-04-01 DIAGNOSIS — R198 Other specified symptoms and signs involving the digestive system and abdomen: Secondary | ICD-10-CM | POA: Diagnosis not present

## 2018-04-03 LAB — OVA AND PARASITE EXAMINATION
CONCENTRATE RESULT: NONE SEEN
SPECIMEN QUALITY:: ADEQUATE
TRICHROME RESULT:: NONE SEEN
VKL: 91274977

## 2018-04-03 LAB — CLOSTRIDIUM DIFFICILE TOXIN B, QUALITATIVE, REAL-TIME PCR: Toxigenic C. Difficile by PCR: NOT DETECTED

## 2018-04-09 DIAGNOSIS — Z1231 Encounter for screening mammogram for malignant neoplasm of breast: Secondary | ICD-10-CM | POA: Diagnosis not present

## 2018-04-10 ENCOUNTER — Ambulatory Visit (AMBULATORY_SURGERY_CENTER): Payer: BLUE CROSS/BLUE SHIELD | Admitting: Gastroenterology

## 2018-04-10 ENCOUNTER — Encounter: Payer: Self-pay | Admitting: Gastroenterology

## 2018-04-10 VITALS — BP 138/71 | HR 86 | Temp 97.5°F | Resp 17 | Ht 65.0 in | Wt 213.0 lb

## 2018-04-10 DIAGNOSIS — K529 Noninfective gastroenteritis and colitis, unspecified: Secondary | ICD-10-CM

## 2018-04-10 MED ORDER — SODIUM CHLORIDE 0.9 % IV SOLN: 500.0000 mL | Freq: Once | INTRAVENOUS | Status: DC

## 2018-04-10 NOTE — Progress Notes (Signed)
A/ox3 pleased with MAC, report to RN 

## 2018-04-10 NOTE — Patient Instructions (Signed)

## 2018-04-10 NOTE — Progress Notes (Signed)
Called to room to assist during endoscopic procedure.  Patient ID and intended procedure confirmed with present staff. Received instructions for my participation in the procedure from the performing physician.  

## 2018-04-10 NOTE — Op Note (Signed)
[facility] Patient Name: [name] Procedure Date: 04/10/2018 4:00 PM MRN: [id number] Endoscopist: Sherilyn Cooter L. Myrtie Neither , MD Age: 62 Referring MD:  Date of Birth: Mar 17, 1956 Gender: Female Account #: [id number] Procedure:                Colonoscopy Indications:              Lower abdominal pain, Chronic diarrhea (stool                            studies negative for C diff and O/P) Medicines:                Monitored Anesthesia Care Procedure:                Pre-Anesthesia Assessment:                           - Prior to the procedure, a History and Physical                            was performed, and patient medications and                            allergies were reviewed. The patient's tolerance of                            previous anesthesia was also reviewed. The risks                            and benefits of the procedure and the sedation                            options and risks were discussed with the patient.                            All questions were answered, and informed consent                            was obtained. Prior Anticoagulants: The patient has                            taken no previous anticoagulant or antiplatelet                            agents. ASA Grade Assessment: II - A patient with                            mild systemic disease. After reviewing the risks                            and benefits, the patient was deemed in                            satisfactory condition to undergo the procedure.  After obtaining informed consent, the colonoscope                            was passed under direct vision. Throughout the                            procedure, the patient's blood pressure, pulse, and                            oxygen saturations were monitored continuously. The                            Colonoscope was introduced through the anus and                            advanced to the the cecum,  identified by                            appendiceal orifice and ileocecal valve. The                            colonoscopy was performed without difficulty. The                            patient tolerated the procedure well. The quality                            of the bowel preparation was excellent. The                            ileocecal valve, appendiceal orifice, and rectum                            were photographed. The quality of the bowel                            preparation was evaluated using the BBPS [facility]                            Bowel Preparation Scale) with scores of: Right                            Colon = 3, Transverse Colon = 3 and Left Colon = 3                            (entire mucosa seen well with no residual staining,                            small fragments of stool or opaque liquid). The                            total BBPS score equals 9. Scope In: 4:25:20 PM Scope Out: 4:39:05 PM Scope Withdrawal Time: 0 hours 8  minutes 23 seconds  Total Procedure Duration: 0 hours 13 minutes 45 seconds  Findings:                 The perianal and digital rectal examinations were                            normal.                           An area of mildly congested mucosa was found at the                            splenic flexure. Biopsies for histology were taken                            with a cold forceps from the right colon and left                            colon for evaluation of microscopic colitis.                           The exam was otherwise without abnormality on                            direct and retroflexion views. Complications:            No immediate complications. Estimated Blood Loss:     Estimated blood loss: none. Impression:               - Congested mucosa at the splenic flexure. Biopsied.                           - The examination was otherwise normal on direct                            and retroflexion  views. Recommendation:           - Patient has a contact number available for                            emergencies. The signs and symptoms of potential                            delayed complications were discussed with the                            patient. Return to normal activities tomorrow.                            Written discharge instructions were provided to the                            patient.                           - Resume previous diet.                           -  Continue present medications.                           - Await pathology results. If microscopic colitis,                            start appropriate therapy. If normal, trial of                            anti-spasmodic therapy.                           - Repeat colonoscopy in 10 years for screening                            purposes. Kaziyah Parkison L. Myrtie Neither, MD 04/10/2018 4:47:18 PM This report has been signed electronically.

## 2018-04-13 ENCOUNTER — Telehealth: Payer: Self-pay

## 2018-04-13 NOTE — Telephone Encounter (Signed)
First post procedure follow up call, no answer 

## 2018-04-13 NOTE — Telephone Encounter (Signed)
  Follow up Call-  Call back number 04/10/2018  Post procedure Call Back phone  # [phone]  Permission to leave phone message Yes  Some recent data might be hidden     No answer

## 2018-04-17 DIAGNOSIS — E7800 Pure hypercholesterolemia, unspecified: Secondary | ICD-10-CM | POA: Diagnosis not present

## 2018-04-17 DIAGNOSIS — M150 Primary generalized (osteo)arthritis: Secondary | ICD-10-CM | POA: Diagnosis not present

## 2018-04-17 DIAGNOSIS — K219 Gastro-esophageal reflux disease without esophagitis: Secondary | ICD-10-CM | POA: Diagnosis not present

## 2018-04-17 DIAGNOSIS — I10 Essential (primary) hypertension: Secondary | ICD-10-CM | POA: Diagnosis not present

## 2018-04-17 DIAGNOSIS — Z23 Encounter for immunization: Secondary | ICD-10-CM | POA: Diagnosis not present

## 2018-04-17 DIAGNOSIS — Z1159 Encounter for screening for other viral diseases: Secondary | ICD-10-CM | POA: Diagnosis not present

## 2018-04-17 DIAGNOSIS — F419 Anxiety disorder, unspecified: Secondary | ICD-10-CM | POA: Diagnosis not present

## 2018-04-27 DIAGNOSIS — Z780 Asymptomatic menopausal state: Secondary | ICD-10-CM | POA: Diagnosis not present

## 2018-04-27 DIAGNOSIS — Z90710 Acquired absence of both cervix and uterus: Secondary | ICD-10-CM | POA: Diagnosis not present

## 2018-04-27 DIAGNOSIS — Z96653 Presence of artificial knee joint, bilateral: Secondary | ICD-10-CM | POA: Diagnosis not present

## 2018-04-27 DIAGNOSIS — E349 Endocrine disorder, unspecified: Secondary | ICD-10-CM | POA: Diagnosis not present

## 2018-06-24 DIAGNOSIS — Z23 Encounter for immunization: Secondary | ICD-10-CM | POA: Diagnosis not present

## 2018-07-20 DIAGNOSIS — M5412 Radiculopathy, cervical region: Secondary | ICD-10-CM | POA: Diagnosis not present

## 2018-07-20 DIAGNOSIS — M542 Cervicalgia: Secondary | ICD-10-CM | POA: Diagnosis not present

## 2018-08-11 DIAGNOSIS — M5030 Other cervical disc degeneration, unspecified cervical region: Secondary | ICD-10-CM | POA: Diagnosis not present

## 2018-09-08 DIAGNOSIS — M5412 Radiculopathy, cervical region: Secondary | ICD-10-CM | POA: Diagnosis not present

## 2018-09-08 DIAGNOSIS — M5030 Other cervical disc degeneration, unspecified cervical region: Secondary | ICD-10-CM | POA: Diagnosis not present

## 2019-01-15 DIAGNOSIS — K219 Gastro-esophageal reflux disease without esophagitis: Secondary | ICD-10-CM | POA: Diagnosis not present

## 2019-01-15 DIAGNOSIS — F419 Anxiety disorder, unspecified: Secondary | ICD-10-CM | POA: Diagnosis not present

## 2019-01-15 DIAGNOSIS — I10 Essential (primary) hypertension: Secondary | ICD-10-CM | POA: Diagnosis not present

## 2019-01-15 DIAGNOSIS — M150 Primary generalized (osteo)arthritis: Secondary | ICD-10-CM | POA: Diagnosis not present

## 2019-01-25 DIAGNOSIS — I10 Essential (primary) hypertension: Secondary | ICD-10-CM | POA: Diagnosis not present

## 2019-02-03 DIAGNOSIS — Z23 Encounter for immunization: Secondary | ICD-10-CM | POA: Diagnosis not present

## 2019-03-17 DIAGNOSIS — Z86018 Personal history of other benign neoplasm: Secondary | ICD-10-CM | POA: Diagnosis not present

## 2019-03-17 DIAGNOSIS — L814 Other melanin hyperpigmentation: Secondary | ICD-10-CM | POA: Diagnosis not present

## 2019-03-17 DIAGNOSIS — L821 Other seborrheic keratosis: Secondary | ICD-10-CM | POA: Diagnosis not present

## 2019-03-17 DIAGNOSIS — L570 Actinic keratosis: Secondary | ICD-10-CM | POA: Diagnosis not present

## 2019-03-17 DIAGNOSIS — D225 Melanocytic nevi of trunk: Secondary | ICD-10-CM | POA: Diagnosis not present

## 2019-04-19 DIAGNOSIS — Z1231 Encounter for screening mammogram for malignant neoplasm of breast: Secondary | ICD-10-CM | POA: Diagnosis not present

## 2019-04-20 ENCOUNTER — Other Ambulatory Visit: Payer: Self-pay

## 2019-04-20 ENCOUNTER — Ambulatory Visit (INDEPENDENT_AMBULATORY_CARE_PROVIDER_SITE_OTHER): Payer: Self-pay | Admitting: Plastic Surgery

## 2019-04-20 ENCOUNTER — Encounter: Payer: Self-pay | Admitting: Plastic Surgery

## 2019-04-20 DIAGNOSIS — Z719 Counseling, unspecified: Secondary | ICD-10-CM

## 2019-04-20 NOTE — Progress Notes (Signed)
Botulinum Toxin - Dysport and Filler Injection Procedure Note  Procedure: Cosmetic botulinum toxin and Filler administration  Pre-operative Diagnosis: Dynamic rhytides and midface volume loss  Post-operative Diagnosis: Same  Complications:  None  Brief history: The patient desires botulinum toxin injection of her forehead. I discussed with the patient this proposed procedure of botulinum toxin injections, which is customized depending on the particular needs of the patient. It is performed on facial rhytids as a temporary correction. The alternatives were discussed with the patient. The risks were addressed including bleeding, scarring, infection, damage to deeper structures, asymmetry, and chronic pain, which may occur infrequently after a procedure. The individual's choice to undergo a surgical procedure is based on the comparison of risks to potential benefits. Other risks include unsatisfactory results, brow ptosis, eyelid ptosis, allergic reaction, temporary paralysis, which should go away with time, bruising, blurring disturbances and delayed healing. Botulinum toxin injections do not arrest the aging process or produce permanent tightening of the eyelid.  Operative intervention maybe necessary to maintain the results of a blepharoplasty or botulinum toxin. The patient understands and wishes to proceed. An informed consent was signed and informational brochures given to her prior to the procedure.  Procedure: The area was prepped with alcohol and dried with a clean gauze. Using a clean technique, the botulinum toxin was diluted with 1.25 cc of preservative-free normal saline which was slowly injected with an 18 gauge needle in a tuberculin syringes.  A 32 gauge needles were then used to inject the botulinum toxin. This mixture allow for an aliquot of 5 units per 0.1 cc in each injection site.    Subsequently the mixture was injected in the glabellar and forehead area with preservation of the  temporal branch to the lateral eyebrow as well as into each lateral canthal area beginning from the lateral orbital rim medial to the zygomaticus major in 3 separate areas. A total of 20 Units of botulinum toxin was used. The forehead and glabellar area was injected with care to inject intramuscular only while holding pressure on the supratrochlear vessels in each area during each injection on either side of the medial corrugators. The injection proceeded vertically superiorly to the medial 2/3 of the frontalis muscle and superior 2/3 of the lateral frontalis, again with preservation of the frontal branch.  The midface area was injected at the 3 sub-regions of the mid-face: zygomaticomalar region, anteromedial cheek region, and submalar region for a total of one syringe on each side of the face. The technique used was serial puncture with equal injections in the 3 sub-regions: the zygomaticomalar region, the anteromedial cheek, and the submalar region.  No complications were noted. Light pressure was held for 5 minutes. She was instructed explicitly in post-operative care.  Dysport LOT:  [id number]. Voluma XC Restylane Kysse LOT: [id number] EXP: 2020-08-07

## 2019-06-21 DIAGNOSIS — Z20822 Contact with and (suspected) exposure to covid-19: Secondary | ICD-10-CM | POA: Diagnosis not present

## 2019-06-22 DIAGNOSIS — Z20822 Contact with and (suspected) exposure to covid-19: Secondary | ICD-10-CM | POA: Diagnosis not present

## 2019-08-16 DIAGNOSIS — I10 Essential (primary) hypertension: Secondary | ICD-10-CM | POA: Diagnosis not present

## 2019-08-16 DIAGNOSIS — M150 Primary generalized (osteo)arthritis: Secondary | ICD-10-CM | POA: Diagnosis not present

## 2019-08-16 DIAGNOSIS — K219 Gastro-esophageal reflux disease without esophagitis: Secondary | ICD-10-CM | POA: Diagnosis not present

## 2019-08-16 DIAGNOSIS — E7800 Pure hypercholesterolemia, unspecified: Secondary | ICD-10-CM | POA: Diagnosis not present

## 2019-08-16 DIAGNOSIS — F419 Anxiety disorder, unspecified: Secondary | ICD-10-CM | POA: Diagnosis not present

## 2019-11-18 DIAGNOSIS — R32 Unspecified urinary incontinence: Secondary | ICD-10-CM | POA: Diagnosis not present

## 2020-01-25 DIAGNOSIS — L209 Atopic dermatitis, unspecified: Secondary | ICD-10-CM | POA: Diagnosis not present

## 2020-01-25 DIAGNOSIS — H524 Presbyopia: Secondary | ICD-10-CM | POA: Diagnosis not present

## 2020-02-04 DIAGNOSIS — R05 Cough: Secondary | ICD-10-CM | POA: Diagnosis not present

## 2020-02-04 DIAGNOSIS — J069 Acute upper respiratory infection, unspecified: Secondary | ICD-10-CM | POA: Diagnosis not present

## 2020-02-04 DIAGNOSIS — J029 Acute pharyngitis, unspecified: Secondary | ICD-10-CM | POA: Diagnosis not present

## 2020-02-22 DIAGNOSIS — H10502 Unspecified blepharoconjunctivitis, left eye: Secondary | ICD-10-CM | POA: Diagnosis not present

## 2020-03-28 DIAGNOSIS — M150 Primary generalized (osteo)arthritis: Secondary | ICD-10-CM | POA: Diagnosis not present

## 2020-03-28 DIAGNOSIS — K219 Gastro-esophageal reflux disease without esophagitis: Secondary | ICD-10-CM | POA: Diagnosis not present

## 2020-03-28 DIAGNOSIS — I10 Essential (primary) hypertension: Secondary | ICD-10-CM | POA: Diagnosis not present

## 2020-03-28 DIAGNOSIS — F419 Anxiety disorder, unspecified: Secondary | ICD-10-CM | POA: Diagnosis not present

## 2020-03-28 DIAGNOSIS — Z23 Encounter for immunization: Secondary | ICD-10-CM | POA: Diagnosis not present

## 2020-03-29 DIAGNOSIS — L821 Other seborrheic keratosis: Secondary | ICD-10-CM | POA: Diagnosis not present

## 2020-03-29 DIAGNOSIS — D225 Melanocytic nevi of trunk: Secondary | ICD-10-CM | POA: Diagnosis not present

## 2020-03-29 DIAGNOSIS — L814 Other melanin hyperpigmentation: Secondary | ICD-10-CM | POA: Diagnosis not present

## 2020-03-29 DIAGNOSIS — Z86018 Personal history of other benign neoplasm: Secondary | ICD-10-CM | POA: Diagnosis not present

## 2020-05-03 DIAGNOSIS — Z1231 Encounter for screening mammogram for malignant neoplasm of breast: Secondary | ICD-10-CM | POA: Diagnosis not present

## 2020-07-05 ENCOUNTER — Other Ambulatory Visit: Payer: Self-pay

## 2020-07-05 DIAGNOSIS — Z20822 Contact with and (suspected) exposure to covid-19: Secondary | ICD-10-CM

## 2020-07-06 ENCOUNTER — Other Ambulatory Visit: Payer: BC Managed Care – PPO

## 2020-07-07 LAB — NOVEL CORONAVIRUS, NAA: SARS-CoV-2, NAA: NOT DETECTED

## 2020-07-07 LAB — SARS-COV-2, NAA 2 DAY TAT

## 2020-07-28 NOTE — Progress Notes (Signed)
    Subjective:    CC: R heel pain  I, Molly Weber, LAT, ATC, am serving as scribe for Dr. [name].  HPI: Pt is a 65 y/o female presenting w/ c/o R heel pain since 07/09/20 when she was doing a lot of walking while on vacation in [place].  She locates her pain to her R plantar heel and states that it feels like she was walking on a stone.  She denies any specific injury.  She does think that she had been walking more prior to her pain starting.  Radiating pain: yes into her R lateral plantar foot Swelling: not currently but she did have R foot swelling initially Aggravating factors: walking/weight bearing Treatments tried: Nothing  Pertinent review of Systems: No fevers or chills  Relevant historical information: Overweight.   Objective:    Vitals:   07/31/20 0926  BP: 140/88  Pulse: 93  SpO2: 94%   General: Well Developed, well nourished, and in no acute distress.   MSK: Right foot normal-appearing. Tender palpation plantar calcaneus.  Nontender posterior calcaneus. Normal foot and ankle motion and strength Pulses cap refill and sensation are intact distally.  Lab and Radiology Results  X-ray images right calcaneus obtained today personally and independently interpreted No fractures or bone spurs.   Await formal radiology review   Impression and Recommendations:    Assessment and Plan: 65 y.o. female with plantar calcaneus pain.  Pain due to capsulitis versus plantar fasciitis.  Plan for heel cushioning eccentric exercises and night splints.  Recheck back in 1 month.  Home exercise taught in clinic today by ATC.Marland Kitchen  PDMP not reviewed this encounter. Orders Placed This Encounter  Procedures  . DG Os Calcis Right    Standing Status:   Future    Number of Occurrences:   1    Standing Expiration Date:   07/31/2021    Order Specific Question:   Reason for Exam (SYMPTOM  OR DIAGNOSIS REQUIRED)    Answer:   eval plantar fascitiis    Order Specific Question:    Preferred imaging location?    Answer:   [facility]   No orders of the defined types were placed in this encounter.   Discussed warning signs or symptoms. Please see discharge instructions. Patient expresses understanding.   The above documentation has been reviewed and is accurate and complete [name], M.D.

## 2020-07-31 ENCOUNTER — Encounter: Payer: Self-pay | Admitting: Family Medicine

## 2020-07-31 ENCOUNTER — Ambulatory Visit (INDEPENDENT_AMBULATORY_CARE_PROVIDER_SITE_OTHER): Payer: Medicare Other | Admitting: Family Medicine

## 2020-07-31 ENCOUNTER — Other Ambulatory Visit: Payer: Self-pay

## 2020-07-31 ENCOUNTER — Ambulatory Visit: Payer: Self-pay

## 2020-07-31 ENCOUNTER — Ambulatory Visit (INDEPENDENT_AMBULATORY_CARE_PROVIDER_SITE_OTHER): Payer: Medicare Other

## 2020-07-31 VITALS — BP 140/88 | HR 93 | Ht 65.5 in | Wt 229.6 lb

## 2020-07-31 DIAGNOSIS — M79671 Pain in right foot: Secondary | ICD-10-CM

## 2020-07-31 NOTE — Patient Instructions (Addendum)
Thank you for coming in today.  Plan for heel cushion.   Night Splint for plantar fasciitis.   Ice massage in the evening.   Do the heel exercises. Remember to go down slowly.   Please perform the exercise program that we have prepared for you and gone over in detail on a daily basis.  In addition to the handout you were provided you can access your program through: [email or web address]   Your unique program code is:  [email or web address]   Recheck in 1 month.     Plantar Fasciitis  Plantar fasciitis is a painful foot condition that affects the heel. It occurs when the band of tissue that connects the toes to the heel bone (plantar fascia) becomes irritated. This can happen as the result of exercising too much or doing other repetitive activities (overuse injury). Plantar fasciitis can cause mild irritation to severe pain that makes it difficult to walk or move. The pain is usually worse in the morning after sleeping, or after sitting or lying down for a period of time. Pain may also be worse after long periods of walking or standing. What are the causes? This condition may be caused by:  Standing for long periods of time.  Wearing shoes that do not have good arch support.  Doing activities that put stress on joints (high-impact activities). This includes ballet and exercise that makes your heart beat faster (aerobic exercise), such as running.  Being overweight.  An abnormal way of walking (gait).  Tight muscles in the back of your lower leg (calf).  High arches in your feet or flat feet.  Starting a new athletic activity. What are the signs or symptoms? The main symptom of this condition is heel pain. Pain may get worse after the following:  Taking the first steps after a time of rest, especially in the morning after awakening, or after you have been sitting or lying down for a while.  Long periods of standing still. Pain may decrease after 30-45 minutes of activity, such as  gentle walking. How is this diagnosed? This condition may be diagnosed based on your medical history, a physical exam, and your symptoms. Your health care provider will check for:  A tender area on the bottom of your foot.  A high arch in your foot or flat feet.  Pain when you move your foot.  Difficulty moving your foot. You may have imaging tests to confirm the diagnosis, such as:  X-rays.  Ultrasound.  MRI. How is this treated? Treatment for plantar fasciitis depends on how severe your condition is. Treatment may include:  Rest, ice, pressure (compression), and raising (elevating) the affected foot. This is called RICE therapy. Your health care provider may recommend RICE therapy along with over-the-counter pain medicines to manage your pain.  Exercises to stretch your calves and your plantar fascia.  A splint that holds your foot in a stretched, upward position while you sleep (night splint).  Physical therapy to relieve symptoms and prevent problems in the future.  Injections of steroid medicine (cortisone) to relieve pain and inflammation.  Stimulating your plantar fascia with electrical impulses (extracorporeal shock wave therapy). This is usually the last treatment option before surgery.  Surgery, if other treatments have not worked after 12 months. Follow these instructions at home: Managing pain, stiffness, and swelling  If directed, put ice on the painful area. To do this: ? Put ice in a plastic bag, or use a frozen bottle of water. ? Place a towel  between your skin and the bag or bottle. ? Roll the bottom of your foot over the bag or bottle. ? Do this for 20 minutes, 2-3 times a day.  Wear athletic shoes that have air-sole or gel-sole cushions, or try soft shoe inserts that are designed for plantar fasciitis.  Elevate your foot above the level of your heart while you are sitting or lying down.   Activity  Avoid activities that cause pain. Ask your health  care provider what activities are safe for you.  Do physical therapy exercises and stretches as told by your health care provider.  Try activities and forms of exercise that are easier on your joints (low impact). Examples include swimming, water aerobics, and biking. General instructions  Take over-the-counter and prescription medicines only as told by your health care provider.  Wear a night splint while sleeping, if told by your health care provider. Loosen the splint if your toes tingle, become numb, or turn cold and blue.  Maintain a healthy weight, or work with your health care provider to lose weight as needed.  Keep all follow-up visits. This is important. Contact a health care provider if you have:  Symptoms that do not go away with home treatment.  Pain that gets worse.  Pain that affects your ability to move or do daily activities. Summary  Plantar fasciitis is a painful foot condition that affects the heel. It occurs when the band of tissue that connects the toes to the heel bone (plantar fascia) becomes irritated.  Heel pain is the main symptom of this condition. It may get worse after exercising too much or standing still for a long time.  Treatment varies, but it usually starts with rest, ice, pressure (compression), and raising (elevating) the affected foot. This is called RICE therapy. Over-the-counter medicines can also be used to manage pain. This information is not intended to replace advice given to you by your health care provider. Make sure you discuss any questions you have with your health care provider. Document Revised: 09/13/2019 Document Reviewed: 09/13/2019 Elsevier Patient Education  2021 [employer].

## 2020-07-31 NOTE — Progress Notes (Signed)
No fractures or bone lesions.  No significant bone spurs are seen.

## 2020-08-28 ENCOUNTER — Ambulatory Visit: Payer: Medicare Other | Admitting: Family Medicine

## 2020-09-18 ENCOUNTER — Ambulatory Visit (INDEPENDENT_AMBULATORY_CARE_PROVIDER_SITE_OTHER): Payer: Self-pay | Admitting: Plastic Surgery

## 2020-09-18 ENCOUNTER — Encounter: Payer: Self-pay | Admitting: Plastic Surgery

## 2020-09-18 ENCOUNTER — Other Ambulatory Visit: Payer: Self-pay

## 2020-09-18 VITALS — BP 144/82 | HR 85

## 2020-09-18 DIAGNOSIS — Z411 Encounter for cosmetic surgery: Secondary | ICD-10-CM

## 2020-09-18 NOTE — Progress Notes (Signed)
Patient presents to discuss lip filler injections.  She is had this in the past and is interested in getting it again.  Previously she had injections into the lips themselves along with the philtral columns to increase lip fullness and definition.  Is been over 6 months since she has had this previously.  On exam she has fairly symmetric upper and lower lips with no external asymmetries or irregularities.  We discussed the risks and benefits of filler and the patient understands and wants to proceed.  The areas were prepped with a chlorhexidine wipe and 1 cc of Restylane case was injected throughout the upper and lower lips in addition to the philtral columns.  This gave a nice on table result.  I have asked her to make a follow-up appointment for any touchups if necessary we will plan to see her again at her next visit.

## 2020-09-20 ENCOUNTER — Other Ambulatory Visit: Payer: Self-pay

## 2020-09-20 ENCOUNTER — Ambulatory Visit (INDEPENDENT_AMBULATORY_CARE_PROVIDER_SITE_OTHER): Payer: Self-pay

## 2020-09-20 VITALS — BP 138/79 | HR 81 | Temp 97.8°F | Ht 64.0 in | Wt 224.0 lb

## 2020-09-20 DIAGNOSIS — Z719 Counseling, unspecified: Secondary | ICD-10-CM

## 2020-10-04 NOTE — Progress Notes (Signed)
09/20/20-  Preoperative Dx: facial aging/hyperpigmentation  Postoperative Dx:  same  Procedure: laser to face  Anesthesia: EMLA -pt applied 30 mins prior to procedure  Description of Procedure:  Risks and complications were explained to the patient. Consent was confirmed and signed. Time out was called and all information was confirmed to be correct. The area  was prepped with alcohol and wiped dry.  The IPL  laser was set at the following:  skin -1/ suntan light = 6.8J The entire face was lasered The patient tolerated the procedure well and there were no complications.  Aloe applied She is reminded to protect skin with sunscreen & moisturizer & avoid retina products & no abrasive scrubs for approx. 7 to 10 days  She is reminded that she may experience redness/peeling & irritation Patient to f/u in 6 weeks-or as needed  She will call for any concerns [place]

## 2020-10-04 NOTE — Patient Instructions (Signed)
Pt will protect skin from sun/sunscreen. Use moisturizer- avoid harsh scrubs/retinol products F/U PRN- or in 6 weeks for another IPL Call for any concerns

## 2021-09-25 ENCOUNTER — Encounter: Payer: Medicare Other | Admitting: Plastic Surgery

## 2021-11-22 IMAGING — DX DG OS CALCIS 2+V*R*
2 series · 2 of 2 positions shown · non-contrast
Comparison: None.

CLINICAL DATA: Acute right heel pain.

EXAM:
RIGHT OS CALCIS - 2+ VIEW

[calcaneus axial]
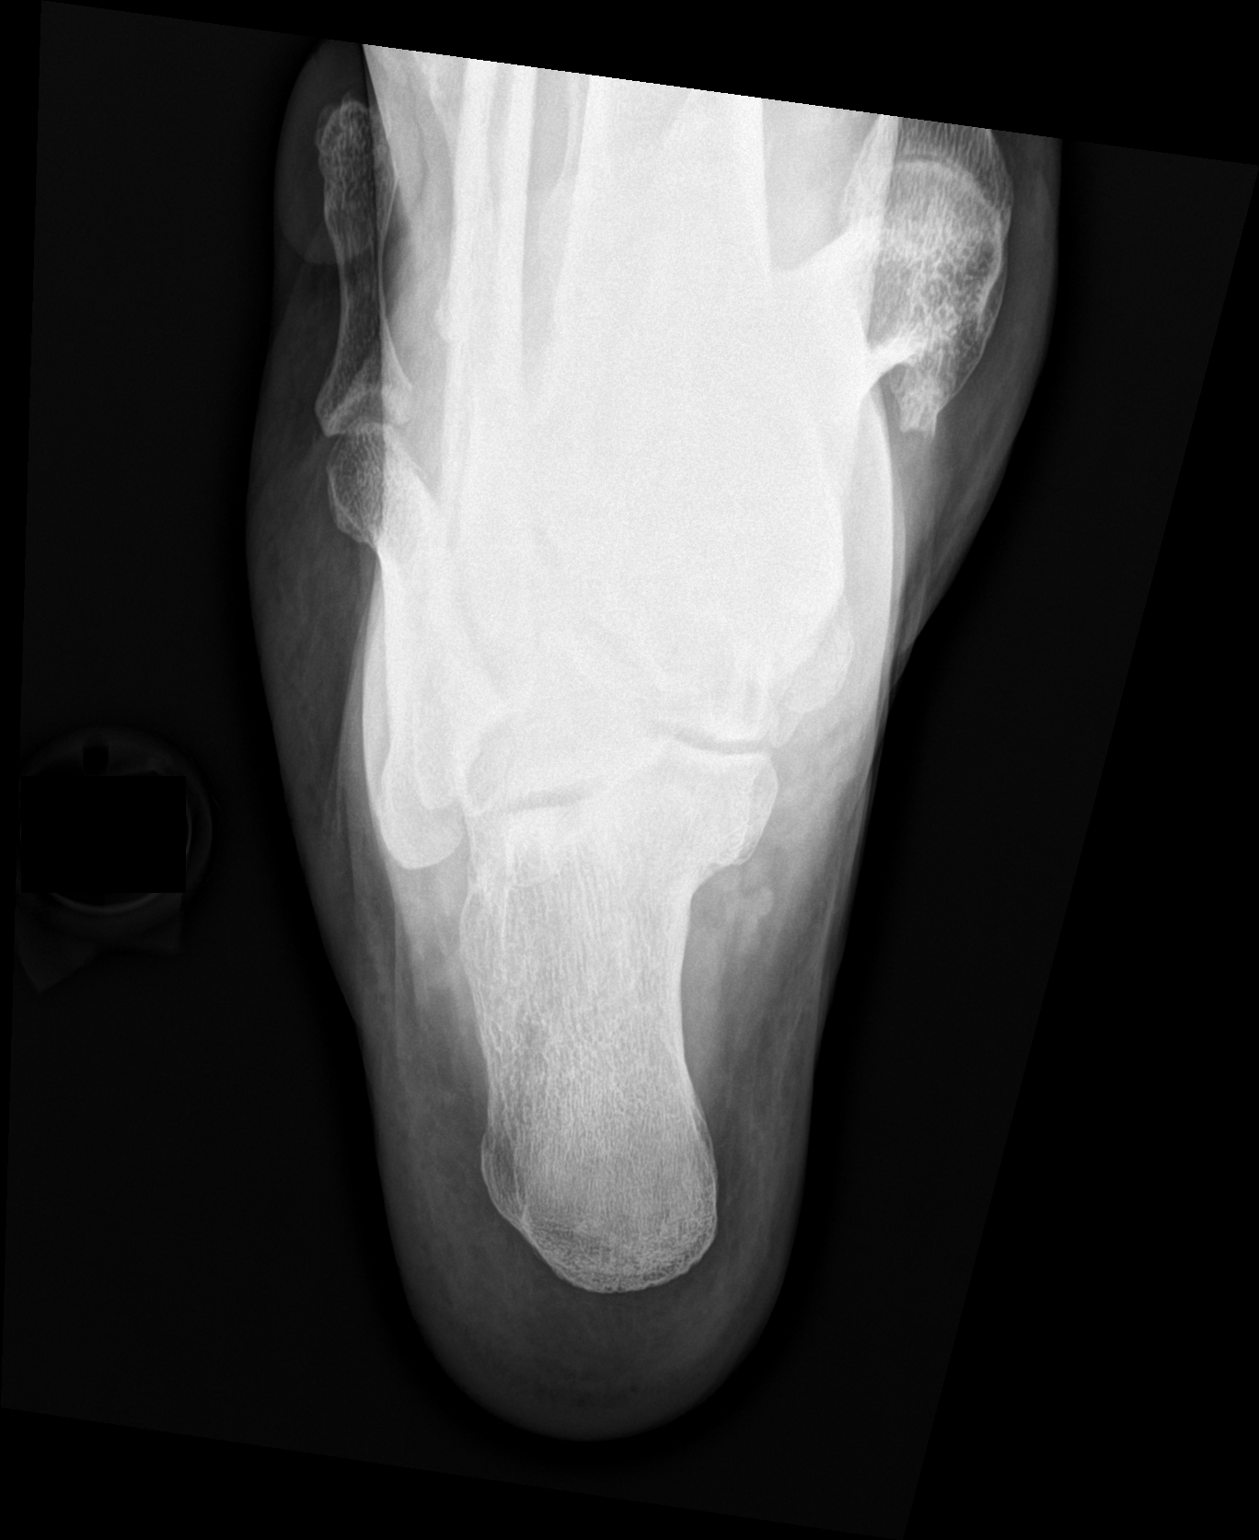

[calcaneus lat]
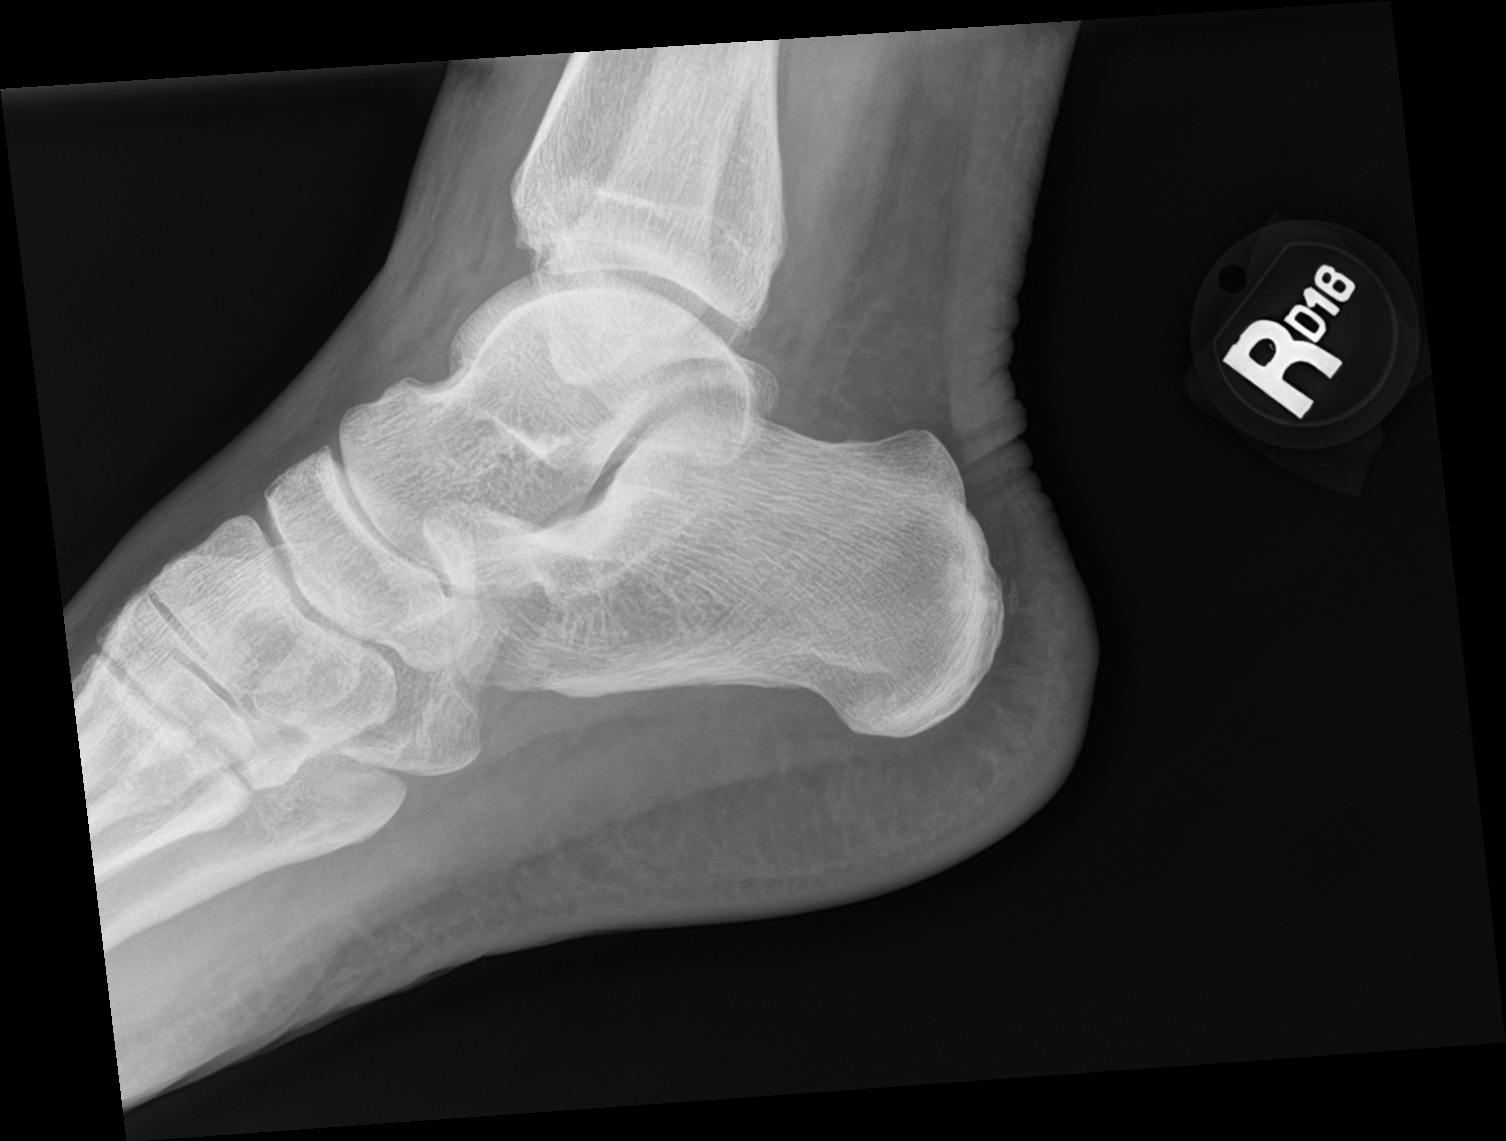

[2 of 2 positions shown; findings below may reference images not displayed]

FINDINGS: There is no evidence of fracture or other focal bone lesions. Soft
tissues are unremarkable.
IMPRESSION: Negative.

## 2023-02-26 ENCOUNTER — Other Ambulatory Visit: Payer: Self-pay

## 2023-02-26 ENCOUNTER — Encounter: Payer: Self-pay | Admitting: Neurology

## 2023-02-26 ENCOUNTER — Ambulatory Visit (INDEPENDENT_AMBULATORY_CARE_PROVIDER_SITE_OTHER): Payer: Medicare Other | Admitting: Family Medicine

## 2023-02-26 VITALS — BP 118/74 | HR 83 | Ht 64.0 in | Wt 181.0 lb

## 2023-02-26 DIAGNOSIS — M79672 Pain in left foot: Secondary | ICD-10-CM

## 2023-02-26 DIAGNOSIS — M79671 Pain in right foot: Secondary | ICD-10-CM | POA: Diagnosis not present

## 2023-02-26 MED ORDER — PREGABALIN 75 MG PO CAPS: 75.0000 mg | ORAL_CAPSULE | Freq: Two times a day (BID) | ORAL | 3 refills | Status: DC | PRN

## 2023-02-26 NOTE — Progress Notes (Signed)
   Rubin Payor, PhD, LAT, ATC acting as a scribe for [name], MD.  [name] is a 67 y.o. female who presents to [facility] at [facility] today for bilateral foot pain. Pt was previously seen by Dr. Denyse Amass on 07/31/20 for R heel pain and she was advised to use heel cushioning, night splints, and was taught HEP.  Today, pt c/o bilat foot pain toward the end of 2023, worsening over the last 6 months. Pt locates pain to the plantar aspect of her foot, midfoot to toes. She notes a "burning" "tingling" type of pain.   Aggravates: nothing in particular, varies Treatments tried: gabapentin, creams, ice, Vicks, B12  Dx imaging: 07/31/20 R Os Calcis XR  09/05/14 L foot XR  Pertinent review of systems: No fevers or chills  Relevant historical information: History of left knee replacement   Exam:  BP 118/74   Pulse 83   Ht 5\' 4"  (1.626 m)   Wt 181 lb (82.1 kg)   SpO2 94%   BMI 31.07 kg/m  General: Well Developed, well nourished, and in no acute distress.   MSK: Early generally normal-appearing. Normal pulses and capillary refill.  Normal foot and ankle motion. Nontender palpation metatarsal heads. Monofilament testing is mildly reduced dorsal and plantar feet bilaterally.      Assessment and Plan: 67 y.o. female with bilateral forefoot paresthesia and pain.  Most likely explanation is peripheral neuropathy.  Atypical metatarsalgia or Morton's neuroma could be possibility as well.  She did already tried gabapentin and found it to be not very helpful.  Will try Lyrica and metatarsal pads.  As this has been ongoing for almost a year now we will proceed to nerve conduction study to further evaluate.  If not improved proceed to ABI and possibly even MRI of the lumbar spine or advanced imaging of the foot. After the nerve conduction study results come back.  PDMP reviewed during this encounter. Orders Placed This Encounter  Procedures   Ambulatory referral  to Neurology    Referral Priority:   Routine    Referral Type:   Consultation    Referral Reason:   Specialty Services Required    Requested Specialty:   Neurology    Number of Visits Requested:   1   NCV with EMG(electromyography)    Standing Status:   Future    Standing Expiration Date:   02/26/2024    Order Specific Question:   Where should this test be performed?    Answer:   [facility]    Order Specific Question:   Location on body    Answer:   Lower extremity    Order Specific Question:   Laterality    Answer:   Bilateral    Order Specific Question:   Clinical Indication    Answer:   Paresthesia BL LE   Meds ordered this encounter  Medications   pregabalin (LYRICA) 75 MG capsule    Sig: Take 1 capsule (75 mg total) by mouth 2 (two) times daily as needed.    Dispense:  60 capsule    Refill:  3     Discussed warning signs or symptoms. Please see discharge instructions. Patient expresses understanding.   The above documentation has been reviewed and is accurate and complete [name], M.D.

## 2023-02-26 NOTE — Patient Instructions (Signed)
Thank you for coming in today.   Try metatarsal pads or insoles with metatarsal pads built in or insoles for metatarsalgia.  Try the lyrica mostly at bedtime.   Plan for a nerve condition study with neurology.  You should hear soon about scheduling.   Recheck once we get the results.

## 2023-04-07 ENCOUNTER — Ambulatory Visit (INDEPENDENT_AMBULATORY_CARE_PROVIDER_SITE_OTHER): Payer: Medicare Other | Admitting: Neurology

## 2023-04-07 DIAGNOSIS — M79672 Pain in left foot: Secondary | ICD-10-CM

## 2023-04-07 DIAGNOSIS — M79671 Pain in right foot: Secondary | ICD-10-CM

## 2023-04-07 NOTE — Procedures (Signed)
[facility]  [address] [place], [address]  [place], [place] [address] Tel: [phone] Fax: [phone] Test Date:  04/07/2023  Patient: [name] DOB: 1956/04/14 Physician: Jacquelyne Balint, MD  Sex: Female Height: 5\' 4"  Ref Phys: [name], MD  ID#: [id number]   Technician:    History: This is a 67 year old female with bilateral foot pain.  NCV & EMG Findings: Extensive electrodiagnostic evaluation of bilateral lower limbs shows: Bilateral sural and superficial peroneal/fibular sensory responses are within normal limits. Bilateral peroneal/fibular (EDB) and tibial (AH) motor responses are within normal limits. Bilateral H reflex latency is within normal limits. There is no evidence of active or chronic motor axon loss changes affecting any of the tested muscles. Motor unit configuration and recruitment pattern is within normal limits.  Impression: This is a normal study of bilateral lower limbs. In particular, there is no electrodiagnostic evidence of a right or left lumbosacral (L3-S1) radiculopathy, large fiber sensorimotor neuropathy, or myopathy.    ___________________________ Jacquelyne Balint, MD    Nerve Conduction Studies Motor Nerve Results    Latency Amplitude F-Lat Segment Distance CV Comment  Site (ms) Norm (mV) Norm (ms)  (cm) (m/s) Norm   Left Fibular (EDB) Motor  Ankle 3.2  < 6.0 3.3  > 2.5        Bel fib head 10.5 - 2.4 -  Bel fib head-Ankle 31.5 43  > 40   Pop fossa 12.5 - 2.4 -  Pop fossa-Bel fib head 9 45 -   Right Fibular (EDB) Motor  Ankle 3.2  < 6.0 5.1  > 2.5        Bel fib head 9.9 - 4.5 -  Bel fib head-Ankle 31 46  > 40   Pop fossa 11.8 - 4.4 -  Pop fossa-Bel fib head 9 47 -   Left Tibial (AH) Motor  Ankle 3.5  < 6.0 6.2  > 4.0        Knee 12.5 - 5.2 -  Knee-Ankle 41 46  > 40   Right Tibial (AH) Motor  Ankle 4.4  < 6.0 5.5  > 4.0        Knee 12.5 - 4.0 -  Knee-Ankle 41 51  > 40    Sensory Sites    Neg Peak Lat Amplitude (O-P)  Segment Distance Velocity Comment  Site (ms) Norm (V) Norm  (cm) (ms)   Left Superficial Fibular Sensory  14 cm-Ankle 2.8  < 4.6 5  > 3 14 cm-Ankle 14    Right Superficial Fibular Sensory  14 cm-Ankle 2.5  < 4.6 7  > 3 14 cm-Ankle 14    Left Sural Sensory  Calf-Lat mall 3.5  < 4.6 7  > 3 Calf-Lat mall 14    Right Sural Sensory  Calf-Lat mall 3.5  < 4.6 8  > 3 Calf-Lat mall 14     H-Reflex Results    M-Lat H Lat H Neg Amp H-M Lat  Site (ms) (ms) Norm (mV) (ms)  Left Tibial H-Reflex  Pop fossa 5.6 32.1  < 35.0 1.44 26.5  Right Tibial H-Reflex  Pop fossa 6.3 32.3  < 35.0 1.02 26.0   Electromyography   Side Muscle Ins.Act Fibs Fasc Recrt Amp Dur Poly Activation Comment  Left Tib ant Nml Nml Nml Nml Nml Nml Nml Nml N/A  Left Gastroc MH Nml Nml Nml Nml Nml Nml Nml Nml N/A  Left FDL Nml Nml Nml Nml Nml Nml Nml Nml N/A  Left Rectus fem Nml Nml Nml Nml Nml Nml Nml Nml N/A  Left Biceps fem SH Nml Nml Nml Nml Nml Nml Nml Nml N/A  Left Gluteus med Nml Nml Nml Nml Nml Nml Nml Nml N/A  Right Tib ant Nml Nml Nml Nml Nml Nml Nml Nml N/A  Right Gastroc MH Nml Nml Nml Nml Nml Nml Nml Nml N/A  Right FDL Nml Nml Nml Nml Nml Nml Nml Nml N/A  Right Rectus fem Nml Nml Nml Nml Nml Nml Nml Nml N/A  Right Biceps fem SH Nml Nml Nml Nml Nml Nml Nml Nml N/A  Right Gluteus med Nml Nml Nml Nml Nml Nml Nml Nml N/A      Waveforms:  Motor           Sensory           H-Reflex

## 2023-04-08 NOTE — Progress Notes (Signed)
Nerve conduction study looks normal to Dr. Loleta Chance neurology.  This does not mean there is nothing wrong he just needs the test cannot detect it.  Recommend return to clinic to talk about results and potential next steps.

## 2023-04-17 NOTE — Progress Notes (Signed)
   I, Stevenson Clinch, CMA acting as a scribe for [name], MD.  [name] is a 67 y.o. female who presents to [facility] at [facility] today for f/u bilat foot pain w/ NCV study review. Pt was last seen by Dr. Denyse Amass on 02/26/23 and was prescribed Lyrica and advised to try MT pads, and a NCV study was ordered.   Today, pt reports some improvement overall with Lyrica, though sx have been worse over the past 2 days. Tried new insoles but ended up sending them both back because they didn't fit well in the shoes. Feels like the metatarsal insole aren't correct.   Dx testing: 04/07/23 NCV study 07/31/20 R Os Calcis XR             09/05/14 L foot XR  Pertinent review of systems: No fevers or chills  Relevant historical information: Anemia.  Left total knee replacement.   Exam:  BP 108/74   Pulse 77   Ht 5\' 4"  (1.626 m)   Wt 181 lb (82.1 kg)   SpO2 97%   BMI 31.07 kg/m  General: Well Developed, well nourished, and in no acute distress.   MSK: Normal gait    Lab and Radiology Results  Nerve conduction study dated April 07, 2023 NCV & EMG Findings: Extensive electrodiagnostic evaluation of bilateral lower limbs shows: Bilateral sural and superficial peroneal/fibular sensory responses are within normal limits. Bilateral peroneal/fibular (EDB) and tibial (AH) motor responses are within normal limits. Bilateral H reflex latency is within normal limits. There is no evidence of active or chronic motor axon loss changes affecting any of the tested muscles. Motor unit configuration and recruitment pattern is within normal limits.   Impression: This is a normal study of bilateral lower limbs. In particular, there is no electrodiagnostic evidence of a right or left lumbosacral (L3-S1) radiculopathy, large fiber sensorimotor neuropathy, or myopathy.      Assessment and Plan: 67 y.o. female with bilateral lower extremity paresthesias thought to be neuropathy.   She had a nerve conduction study recently which was normal.  Small fiber neuropathy could still be a possibility as this is not well-seen on nerve conduction studies.  She responded pretty well to Lyrica.  Will adjust Lyrica dose and reassess in the future.  Check back as needed.  Consider ABI in the future as further evaluation if needed.  Additionally we talked about metatarsal pads.  She tried the integrated metatarsal pad insoles which she did not like.  We can use individual metatarsal pads.   PDMP not reviewed this encounter. No orders of the defined types were placed in this encounter.  Meds ordered this encounter  Medications   pregabalin (LYRICA) 100 MG capsule    Sig: Take 1 capsule (100 mg total) by mouth 2 (two) times daily as needed.    Dispense:  60 capsule    Refill:  3     Discussed warning signs or symptoms. Please see discharge instructions. Patient expresses understanding.   The above documentation has been reviewed and is accurate and complete [name], M.D. Total encounter time 30 minutes including face-to-face time with the patient and, reviewing past medical record, and charting on the date of service.

## 2023-04-18 ENCOUNTER — Encounter: Payer: Self-pay | Admitting: Family Medicine

## 2023-04-18 ENCOUNTER — Ambulatory Visit (INDEPENDENT_AMBULATORY_CARE_PROVIDER_SITE_OTHER): Payer: Medicare Other | Admitting: Family Medicine

## 2023-04-18 VITALS — BP 108/74 | HR 77 | Ht 64.0 in | Wt 181.0 lb

## 2023-04-18 DIAGNOSIS — M79671 Pain in right foot: Secondary | ICD-10-CM | POA: Diagnosis not present

## 2023-04-18 DIAGNOSIS — M79672 Pain in left foot: Secondary | ICD-10-CM

## 2023-04-18 MED ORDER — PREGABALIN 100 MG PO CAPS: 100.0000 mg | ORAL_CAPSULE | Freq: Two times a day (BID) | ORAL | 3 refills | Status: DC | PRN

## 2023-04-18 NOTE — Patient Instructions (Addendum)
Thank you for coming in today.   Increase lyrica to 100mg  twice daily.   Next diagnostic step is blood flow test in the legs using an ultrasound.   Let me know how this goes.   [employer] will help position metatarsal pads if you buy them from them.

## 2023-05-14 ENCOUNTER — Encounter: Payer: Self-pay | Admitting: Family Medicine

## 2023-05-14 ENCOUNTER — Other Ambulatory Visit: Payer: Self-pay

## 2023-05-14 ENCOUNTER — Ambulatory Visit (INDEPENDENT_AMBULATORY_CARE_PROVIDER_SITE_OTHER): Payer: Medicare Other

## 2023-05-14 ENCOUNTER — Ambulatory Visit (INDEPENDENT_AMBULATORY_CARE_PROVIDER_SITE_OTHER): Payer: Medicare Other | Admitting: Family Medicine

## 2023-05-14 VITALS — BP 126/86 | HR 85 | Ht 64.0 in | Wt 187.0 lb

## 2023-05-14 DIAGNOSIS — M25531 Pain in right wrist: Secondary | ICD-10-CM

## 2023-05-14 NOTE — Patient Instructions (Signed)
Thank you for coming in today.  You received an injection today. Seek immediate medical attention if the joint becomes red, extremely painful, or is oozing fluid.  Please get an Xray today before you leave  

## 2023-05-14 NOTE — Progress Notes (Unsigned)
I, [name], CMA acting as a scribe for Erica Graham, MD.  [name] is a 67 y.o. female who presents to [facility] at [facility] today for right wrist pain and swelling. Notes stopping Lyrica on Friday d/t weight gain. Tried taking Gabapentin on Sunday which helped with burning sensation in the feet. 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 Woke up Monday morning with significant swelling in the wrist with no known injury. Swelling has improved since onset, continues to have soreness.    Pertinent review of systems: No fevers or chills  Relevant historical information: Status post left total knee replacement. Retired Interior and spatial designer.  Exam:  BP 126/86   Pulse 85   Ht 5\' 4"  (1.626 m)   Wt 187 lb (84.8 kg)   SpO2 100%   BMI 32.10 kg/m  General: Well Developed, well nourished, and in no acute distress.   MSK: Right wrist mild swelling present dorsal wrist.  Nontender palpation dorsal wrist.  Minimally tender palpation base of the thumb. Decreased wrist motion.    Lab and Radiology Results  Procedure: Real-time Ultrasound Guided Injection of right dorsal wrist Device: Philips Affiniti 50G/GE Logiq Images permanently stored and available for review in PACS Verbal informed consent obtained.  Discussed risks and benefits of procedure. Warned about infection, bleeding, hyperglycemia damage to structures among others. Patient expresses understanding and agreement Time-out conducted.   Noted no overlying erythema, induration, or other signs of local infection.   Skin prepped in a sterile fashion.   Local anesthesia: Topical Ethyl chloride.   With sterile technique and under real time ultrasound guidance: 40 mg of Kenalog and 1 mL of lidocaine injected into wrist joint. Fluid seen entering the joint capsule.   Completed without difficulty   Pain immediately resolved suggesting accurate placement of the medication.   Advised to  call if fevers/chills, erythema, induration, drainage, or persistent bleeding.   Images permanently stored and available for review in the ultrasound unit.  Impression: Technically successful ultrasound guided injection.      No results found for this or any previous visit (from the past 72 hour(s)). DG Hand Complete Right  Result Date: 05/14/2023 CLINICAL DATA:  Right wrist and hand pain and swelling for 2 days. No known injury. EXAM: RIGHT HAND - COMPLETE 3+ VIEW COMPARISON:  None Available. FINDINGS: Mildly decreased bone mineralization. 2 mm ulnar negative variance. Severe triscaphe joint space narrowing with subchondral sclerosis and peripheral osteophytosis. Mild thumb carpometacarpal joint space narrowing and peripheral osteophytosis. Mild thumb interphalangeal joint space narrowing. Mild-to-moderate second through fifth DIP joint space narrowing and peripheral osteophytosis, greatest within the second and fifth fingers. Mild fifth PIP joint space narrowing. No acute fracture or dislocation. IMPRESSION: 1. Severe triscaphe osteoarthritis. 2. Mild-to-moderate second through fifth DIP osteoarthritis. Electronically Signed   By: Neita Garnet M.D.   On: 05/14/2023 19:10   I, [name], personally (independently) visualized and performed the interpretation of the images attached in this note.     Assessment and Plan: 67 y.o. female with right dorsal wrist pain thought to be due to arthritis exacerbation.  Gout is a possibility but is less likely.  Plan for steroid injection and Voltaren gel.  Consider formal hand therapy referral if needed.  We talked about dosing of topical Voltaren gel.  Recommend avoiding high-dose oral NSAIDs. This is a chronic problem with an acute exacerbation.  PDMP not reviewed this encounter. Orders Placed This Encounter  Procedures   [place] LIMITED JOINT SPACE STRUCTURES UP RIGHT(NO LINKED CHARGES)    Order Specific Question:   Reason for Exam (SYMPTOM  OR  DIAGNOSIS REQUIRED)    Answer:   righ wrist pain    Order Specific Question:   Preferred imaging location?    Answer:   [facility] Hand Complete Right    Standing Status:   Future    Number of Occurrences:   1    Standing Expiration Date:   06/14/2023    Order Specific Question:   Reason for Exam (SYMPTOM  OR DIAGNOSIS REQUIRED)    Answer:   right hand/wrist pain    Order Specific Question:   Preferred imaging location?    Answer:   [facility]   No orders of the defined types were placed in this encounter.    Discussed warning signs or symptoms. Please see discharge instructions. Patient expresses understanding.   The above documentation has been reviewed and is accurate and complete [name], M.D.

## 2023-05-15 NOTE — Progress Notes (Signed)
Right hand x-ray shows severe arthritis in the wrist and medium arthritis in the fingers.

## 2023-05-16 ENCOUNTER — Ambulatory Visit: Payer: BLUE CROSS/BLUE SHIELD | Admitting: Family Medicine

## 2023-05-26 ENCOUNTER — Telehealth: Payer: Self-pay | Admitting: Family Medicine

## 2023-05-26 NOTE — Telephone Encounter (Signed)
Pt has been taking an old RX of Gabapentin as the Lyrica did not agree with her (weight gain). States Dr. Denyse Amass is aware.  Pt requesting refill of Gabapentin 300mg , 3 times daily to [facility] on [address].

## 2023-05-27 MED ORDER — GABAPENTIN 300 MG PO CAPS: 300.0000 mg | ORAL_CAPSULE | Freq: Three times a day (TID) | ORAL | 3 refills | Status: DC | PRN

## 2023-05-27 NOTE — Telephone Encounter (Signed)
Gabapentin prescribed

## 2023-05-27 NOTE — Telephone Encounter (Signed)
Called pt and advised that rx has been sent to the pharmacy.

## 2023-07-05 ENCOUNTER — Inpatient Hospital Stay: Payer: Medicare Other | Attending: Hematology and Oncology | Admitting: Hematology and Oncology

## 2023-07-05 ENCOUNTER — Encounter: Payer: Self-pay | Admitting: Hematology and Oncology

## 2023-07-05 ENCOUNTER — Other Ambulatory Visit: Payer: Self-pay

## 2023-07-05 ENCOUNTER — Inpatient Hospital Stay: Payer: Medicare Other

## 2023-07-05 VITALS — BP 133/53 | HR 73 | Temp 97.8°F | Resp 16 | Ht 64.0 in | Wt 188.7 lb

## 2023-07-05 DIAGNOSIS — D75839 Thrombocytosis, unspecified: Secondary | ICD-10-CM

## 2023-07-05 DIAGNOSIS — D473 Essential (hemorrhagic) thrombocythemia: Secondary | ICD-10-CM | POA: Insufficient documentation

## 2023-07-05 LAB — CBC WITH DIFFERENTIAL/PLATELET
Abs Immature Granulocytes: 0.02 10*3/uL (ref 0.00–0.07)
Basophils Absolute: 0.1 10*3/uL (ref 0.0–0.1)
Basophils Relative: 1 %
Eosinophils Absolute: 0.3 10*3/uL (ref 0.0–0.5)
Eosinophils Relative: 4 %
HCT: 34.8 % — ABNORMAL LOW (ref 36.0–46.0)
Hemoglobin: 11.6 g/dL — ABNORMAL LOW (ref 12.0–15.0)
Immature Granulocytes: 0 %
Lymphocytes Relative: 22 %
Lymphs Abs: 1.9 10*3/uL (ref 0.7–4.0)
MCH: 30.2 pg (ref 26.0–34.0)
MCHC: 33.3 g/dL (ref 30.0–36.0)
MCV: 90.6 fL (ref 80.0–100.0)
Monocytes Absolute: 0.6 10*3/uL (ref 0.1–1.0)
Monocytes Relative: 7 %
Neutro Abs: 5.6 10*3/uL (ref 1.7–7.7)
Neutrophils Relative %: 66 %
Platelets: 715 10*3/uL — ABNORMAL HIGH (ref 150–400)
RBC: 3.84 MIL/uL — ABNORMAL LOW (ref 3.87–5.11)
RDW: 14.2 % (ref 11.5–15.5)
WBC: 8.4 10*3/uL (ref 4.0–10.5)
nRBC: 0 % (ref 0.0–0.2)

## 2023-07-05 LAB — CMP (CANCER CENTER ONLY)
ALT: 15 U/L (ref 0–44)
AST: 16 U/L (ref 15–41)
Albumin: 4 g/dL (ref 3.5–5.0)
Alkaline Phosphatase: 67 U/L (ref 38–126)
Anion gap: 5 (ref 5–15)
BUN: 24 mg/dL — ABNORMAL HIGH (ref 8–23)
CO2: 29 mmol/L (ref 22–32)
Calcium: 9.1 mg/dL (ref 8.9–10.3)
Chloride: 104 mmol/L (ref 98–111)
Creatinine: 0.91 mg/dL (ref 0.44–1.00)
GFR, Estimated: >60 mL/min (ref >60)
Glucose, Bld: 100 mg/dL — ABNORMAL HIGH (ref 70–99)
Potassium: 4.4 mmol/L (ref 3.5–5.1)
Sodium: 138 mmol/L (ref 135–145)
Total Bilirubin: 0.4 mg/dL (ref 0.0–1.2)
Total Protein: 7 g/dL (ref 6.5–8.1)

## 2023-07-05 NOTE — Progress Notes (Signed)
[facility]cer Center CONSULT NOTE  Patient Care Team: Clovis Riley, L.[name], MD (Inactive) as PCP - General (Family Medicine)  CHIEF COMPLAINTS/PURPOSE OF CONSULTATION:  Essential thrombocytosis.  ASSESSMENT & PLAN:  Thrombocytosis This is a very pleasant 68 yr old female with persistent thrombocytosis referred to hematology for evaluation and recommendations.  Thrombocytosis Chronic thrombocytosis for at least 12 years. Discussed the differential diagnosis of essential thrombocytosis (primary) vs secondary thrombocytosis in detail. No symptoms suggestive of complications such as blood clots. -Order JAK2 mutation testing to evaluate for essential thrombocytosis. -Consider bone marrow biopsy if JAK2 mutation testing is negative or inconclusive given persistent thrombocytosis. -Resume baby aspirin daily -Consider cytoreductive therapy with Hydrea if patient indeed has ET.  Blood Donation Regular blood donation (Power Red) every 3 months for the past 1.5 years. -Continue blood donation as tolerated.  General Health Maintenance -Up to date with cancer screenings. -Continue regular follow-up with primary care physician.  Orders Placed This Encounter  Procedures   CBC with Differential/Platelet    Standing Status:   Standing    Number of Occurrences:   22    Expiration Date:   07/04/2024   CMP (Cancer Center only)    Standing Status:   Future    Number of Occurrences:   1    Expiration Date:   07/04/2024   JAK2 (INCLUDING V617F AND EXON 12), MPL,& CALR W/RFL MPN PANEL (NGS)   BCR-ABL1 FISH    Standing Status:   Future    Number of Occurrences:   1    Expiration Date:   07/04/2024   Miscellaneous [facility] test (send-out)    Standing Status:   Future    Expiration Date:   07/04/2024    Test name / description::   JAK2 mutation analysis, MPL, CALR mutation, ASXL mutation     Differential diagnosis  1. Primary thrombocytosis: Related to myeloproliferative disorders of the bone  marrow especially essential thrombocytosis and CML. I would like to send for BCR-ABL as well as JAK-2 mutation analysis. Patient understands that JAK2 mutation is only present in 50% of essential thrombocytosis so the test is advantageous only if it is positive. If it is negative, it does not rule out. 2. Secondary/reactive thrombocytosis Different causes including infections, inflammation, iron deficiency.  I would like to send out for C-reactive protein, iron studies with ferritin to complete the workup.  Treatment options: 1. If it is primary essential thrombocytosis, treatment would depend on age, presence of JAK 2 mutation, platelet count level as well as history of thrombosis. A. For low risk patient, the treatment would be with aspirin therapy B. for high risk patients, the treatment would be platelet lowering therapy with aspirin 2. Treatment of secondary thrombocytosis would be to treat underlying cause. There would not be any risk of thrombosis with secondary thrombocytosis.  Return to clinic in 3 to 4 weeks to discuss the results of these tests.     HISTORY OF PRESENTING ILLNESS:  [name] 68 y.o. female is here because of thrombocytosis.  Discussed the use of AI scribe software for clinical note transcription with the patient, who gave verbal consent to proceed.  History of Present Illness        The patient, with a longstanding history of elevated platelet counts, presents for further evaluation. The thrombocytosis was first noted approximately ten years ago during a second knee replacement surgery. The patient reports no associated symptoms or changes in health status. She has a family history of a  blood disorder in her father, which was described as a form of blood cancer. The patient has been otherwise stable with no new health problems or unexplained symptoms. She denies any changes in breathing, bowel movements, or urination.  The patient has a past medical history  significant for high blood pressure, acid reflux, sleep apnea (no longer requiring treatment), and a partial hysterectomy. She has a history of joint replacement and fistula repair. The patient is a retired Interior and spatial designer, a non-smoker since 2008, and consumes a couple of glasses of wine per week. She has known allergies to morphine (causing itching) and codeine (causing rash).  The patient has been donating blood regularly, power red donation ( approx two units at a time) approximately every three months, for the past year and a half. She reports feeling well after these donations, except for one instance where she felt dizzy after donating only one unit of blood without receiving fluids. She had previously been taking a baby aspirin but stopped due to concerns about her hemoglobin levels.  Rest of the pertinent 10 point ROS reviewed and neg.  REVIEW OF SYSTEMS:   Constitutional: Denies fevers, chills or abnormal night sweats Eyes: Denies blurriness of vision, double vision or watery eyes Ears, nose, mouth, throat, and face: Denies mucositis or sore throat Respiratory: Denies cough, dyspnea or wheezes Cardiovascular: Denies palpitation, chest discomfort or lower extremity swelling Gastrointestinal:  Denies nausea, heartburn or change in bowel habits Skin: Denies abnormal skin rashes Lymphatics: Denies new lymphadenopathy or easy bruising Neurological:Denies numbness, tingling or new weaknesses Behavioral/Psych: Mood is stable, no new changes  All other systems were reviewed with the patient and are negative.  MEDICAL HISTORY:  Past Medical History:  Diagnosis Date   Anxiety    Arthritis    Atrophic vaginitis    Depression    GERD (gastroesophageal reflux disease)    GERD (gastroesophageal reflux disease)    Hypertension    Osteoarthritis    Seasonal allergies    Sleep apnea     SURGICAL HISTORY: Past Surgical History:  Procedure Laterality Date    fistula repair x 3  10 yrs ago    ABDOMINAL HYSTERECTOMY  yrs ago   COLONOSCOPY     JOINT REPLACEMENT  2012   right   right knee arthroscopic surgery  yrs ago   right rotator cuff repair  8 yrs    TOTAL KNEE ARTHROPLASTY  04/14/2012   Procedure: TOTAL KNEE ARTHROPLASTY;  Surgeon: [name], MD;  Location: [facility];  Service: Orthopedics;  Laterality: Left;    SOCIAL HISTORY: Social History   Socioeconomic History   Marital status: Married    Spouse name: Not on file   Number of children: Not on file   Years of education: Not on file   Highest education level: Not on file  Occupational History   Not on file  Tobacco Use   Smoking status: Former    Current packs/day: 0.00    Average packs/day: 0.3 packs/day for 20.0 years (5.0 ttl pk-yrs)    Types: Cigarettes    Start date: 06/10/1986    Quit date: 06/10/2006    Years since quitting: 17.0   Smokeless tobacco: Never  Substance and Sexual Activity   Alcohol use: Yes    Alcohol/week: 2.0 standard drinks of alcohol    Types: 2 Glasses of wine per week   Drug use: Yes    Types: Marijuana    Comment: marijuan use 30 yrs ago, none current  Sexual activity: Not on file  Other Topics Concern   Not on file  Social History Narrative   Not on file   Social Drivers of Health   Financial Resource Strain: Not on file  Food Insecurity: Not on file  Transportation Needs: Not on file  Physical Activity: Not on file  Stress: Not on file  Social Connections: Not on file  Intimate Partner Violence: Not on file    FAMILY HISTORY: Family History  Problem Relation Age of Onset   Diabetes Maternal Grandfather     ALLERGIES:  is allergic to morphine and codeine and iodine.  MEDICATIONS:  Current Outpatient Medications  Medication Sig Dispense Refill   ALPRAZolam (XANAX) 0.25 MG tablet Take 0.25 mg by mouth as needed. anxiety     Cholecalciferol (VITAMIN D PO) Take by mouth.     FLUoxetine (PROZAC) 20 MG capsule Take 20 mg by mouth every morning.  3   gabapentin  (NEURONTIN) 300 MG capsule Take 1 capsule (300 mg total) by mouth 3 (three) times daily as needed. 90 capsule 3   HYDROcodone-acetaminophen (NORCO/VICODIN) 5-325 MG per tablet      KRILL OIL PO Take by mouth.     lisinopril-hydrochlorothiazide (PRINZIDE,ZESTORETIC) 10-12.5 MG per tablet Take 1 tablet by mouth every morning.     pantoprazole (PROTONIX) 40 MG tablet Take 40 mg by mouth daily.     Probiotic Product (PROBIOTIC DAILY PO) Take by mouth.     No current facility-administered medications for this visit.     PHYSICAL EXAMINATION: ECOG PERFORMANCE STATUS: 0 - Asymptomatic  Vitals:   07/05/23 1033  BP: (!) 133/53  Pulse: 73  Resp: 16  Temp: 97.8 F (36.6 C)  SpO2: 98%   Filed Weights   07/05/23 1033  Weight: 188 lb 11.2 oz (85.6 kg)    GENERAL:alert, no distress and comfortable SKIN: skin color, texture, turgor are normal, no rashes or significant lesions EYES: normal, conjunctiva are pink and non-injected, sclera clear OROPHARYNX:no exudate, no erythema and lips, buccal mucosa, and tongue normal  NECK: supple, thyroid normal size, non-tender, without nodularity LYMPH:  no palpable lymphadenopathy in the cervical, axillary  LUNGS: clear to auscultation and percussion with normal breathing effort HEART: regular rate & rhythm and no murmurs and no lower extremity edema ABDOMEN:abdomen soft, non-tender and normal bowel sounds, ? Mild splenomegaly, tip of spleen palpated on deep inspiration. Musculoskeletal:no cyanosis of digits and no clubbing  PSYCH: alert & oriented x 3 with fluent speech NEURO: no focal motor/sensory deficits  LABORATORY DATA:  I have reviewed the data as listed Lab Results  Component Value Date   WBC 10.2 04/16/2012   HGB 9.6 (L) 04/16/2012   HCT 28.5 (L) 04/16/2012   MCV 92.8 04/16/2012   PLT 500 (H) 04/16/2012     Chemistry      Component Value Date/Time   NA 136 04/16/2012 0410   K 3.5 04/16/2012 0410   CL 98 04/16/2012 0410   CO2 30  04/16/2012 0410   BUN 12 04/16/2012 0410   CREATININE 0.66 04/16/2012 0410      Component Value Date/Time   CALCIUM 8.3 (L) 04/16/2012 0410       RADIOGRAPHIC STUDIES: I have personally reviewed the radiological images as listed and agreed with the findings in the report. No results found.  All questions were answered. The patient knows to call the clinic with any problems, questions or concerns. I spent 45 minutes in the care of this patient including  H and P, review of records, counseling and coordination of care.     [name], MD 07/05/2023 11:11 AM

## 2023-07-05 NOTE — Assessment & Plan Note (Signed)
This is a very pleasant 68 yr old female with persistent thrombocytosis referred to hematology for evaluation and recommendations.  Thrombocytosis Chronic thrombocytosis for at least 12 years. Discussed the differential diagnosis of essential thrombocytosis (primary) vs secondary thrombocytosis in detail. No symptoms suggestive of complications such as blood clots. -Order JAK2 mutation testing to evaluate for essential thrombocytosis. -Consider bone marrow biopsy if JAK2 mutation testing is negative or inconclusive given persistent thrombocytosis. -Resume baby aspirin daily -Consider cytoreductive therapy with Hydrea if patient indeed has ET.  Blood Donation Regular blood donation (Power Red) every 3 months for the past 1.5 years. -Continue blood donation as tolerated.  General Health Maintenance -Up to date with cancer screenings. -Continue regular follow-up with primary care physician.

## 2023-07-08 ENCOUNTER — Inpatient Hospital Stay: Payer: Medicare Other | Admitting: Hematology and Oncology

## 2023-07-08 NOTE — Progress Notes (Deleted)
Cancer Center CONSULT NOTE  Patient Care Team: Clovis Riley, L.[name], MD (Inactive) as PCP - General (Family Medicine)  CHIEF COMPLAINTS/PURPOSE OF CONSULTATION:  Essential thrombocytosis.  ASSESSMENT & PLAN:  No problem-specific Assessment & Plan notes found for this encounter.   No orders of the defined types were placed in this encounter.    Differential diagnosis  1. Primary thrombocytosis: Related to myeloproliferative disorders of the bone marrow especially essential thrombocytosis and CML. I would like to send for BCR-ABL as well as JAK-2 mutation analysis. Patient understands that JAK2 mutation is only present in 50% of essential thrombocytosis so the test is advantageous only if it is positive. If it is negative, it does not rule out. 2. Secondary/reactive thrombocytosis Different causes including infections, inflammation, iron deficiency.  I would like to send out for C-reactive protein, iron studies with ferritin to complete the workup.  Treatment options: 1. If it is primary essential thrombocytosis, treatment would depend on age, presence of JAK 2 mutation, platelet count level as well as history of thrombosis. A. For low risk patient, the treatment would be with aspirin therapy B. for high risk patients, the treatment would be platelet lowering therapy with aspirin 2. Treatment of secondary thrombocytosis would be to treat underlying cause. There would not be any risk of thrombosis with secondary thrombocytosis.  Return to clinic in 3 to 4 weeks to discuss the results of these tests.     HISTORY OF PRESENTING ILLNESS:  [name] 68 y.o. female is here because of thrombocytosis.  Discussed the use of AI scribe software for clinical note transcription with the patient, who gave verbal consent to proceed.  History of Present Illness        The patient, with a longstanding history of elevated platelet counts, presents for further evaluation. The  thrombocytosis was first noted approximately ten years ago during a second knee replacement surgery. The patient reports no associated symptoms or changes in health status. She has a family history of a blood disorder in her father, which was described as a form of blood cancer. The patient has been otherwise stable with no new health problems or unexplained symptoms. She denies any changes in breathing, bowel movements, or urination.  The patient has a past medical history significant for high blood pressure, acid reflux, sleep apnea (no longer requiring treatment), and a partial hysterectomy. She has a history of joint replacement and fistula repair. The patient is a retired Interior and spatial designer, a non-smoker since 2008, and consumes a couple of glasses of wine per week. She has known allergies to morphine (causing itching) and codeine (causing rash).  The patient has been donating blood regularly, power red donation ( approx two units at a time) approximately every three months, for the past year and a half. She reports feeling well after these donations, except for one instance where she felt dizzy after donating only one unit of blood without receiving fluids. She had previously been taking a baby aspirin but stopped due to concerns about her hemoglobin levels.  Rest of the pertinent 10 point ROS reviewed and neg.  REVIEW OF SYSTEMS:   Constitutional: Denies fevers, chills or abnormal night sweats Eyes: Denies blurriness of vision, double vision or watery eyes Ears, nose, mouth, throat, and face: Denies mucositis or sore throat Respiratory: Denies cough, dyspnea or wheezes Cardiovascular: Denies palpitation, chest discomfort or lower extremity swelling Gastrointestinal:  Denies nausea, heartburn or change in bowel habits Skin: Denies abnormal skin rashes Lymphatics: Denies new  lymphadenopathy or easy bruising Neurological:Denies numbness, tingling or new weaknesses Behavioral/Psych: Mood is stable, no  new changes  All other systems were reviewed with the patient and are negative.  MEDICAL HISTORY:  Past Medical History:  Diagnosis Date   Anxiety    Arthritis    Atrophic vaginitis    Depression    GERD (gastroesophageal reflux disease)    GERD (gastroesophageal reflux disease)    Hypertension    Osteoarthritis    Seasonal allergies    Sleep apnea     SURGICAL HISTORY: Past Surgical History:  Procedure Laterality Date    fistula repair x 3  10 yrs ago   ABDOMINAL HYSTERECTOMY  yrs ago   COLONOSCOPY     JOINT REPLACEMENT  2012   right   right knee arthroscopic surgery  yrs ago   right rotator cuff repair  8 yrs    TOTAL KNEE ARTHROPLASTY  04/14/2012   Procedure: TOTAL KNEE ARTHROPLASTY;  Surgeon: [name], MD;  Location: [facility];  Service: Orthopedics;  Laterality: Left;    SOCIAL HISTORY: Social History   Socioeconomic History   Marital status: Married    Spouse name: Not on file   Number of children: Not on file   Years of education: Not on file   Highest education level: Not on file  Occupational History   Not on file  Tobacco Use   Smoking status: Former    Current packs/day: 0.00    Average packs/day: 0.3 packs/day for 20.0 years (5.0 ttl pk-yrs)    Types: Cigarettes    Start date: 06/10/1986    Quit date: 06/10/2006    Years since quitting: 17.0   Smokeless tobacco: Never  Substance and Sexual Activity   Alcohol use: Yes    Alcohol/week: 2.0 standard drinks of alcohol    Types: 2 Glasses of wine per week   Drug use: Yes    Types: Marijuana    Comment: marijuan use 30 yrs ago, none current   Sexual activity: Not on file  Other Topics Concern   Not on file  Social History Narrative   Not on file   Social Drivers of Health   Financial Resource Strain: Not on file  Food Insecurity: Not on file  Transportation Needs: Not on file  Physical Activity: Not on file  Stress: Not on file  Social Connections: Not on file  Intimate Partner Violence:  Not on file    FAMILY HISTORY: Family History  Problem Relation Age of Onset   Diabetes Maternal Grandfather     ALLERGIES:  is allergic to morphine and codeine and iodine.  MEDICATIONS:  Current Outpatient Medications  Medication Sig Dispense Refill   ALPRAZolam (XANAX) 0.25 MG tablet Take 0.25 mg by mouth as needed. anxiety     Cholecalciferol (VITAMIN D PO) Take by mouth.     FLUoxetine (PROZAC) 20 MG capsule Take 20 mg by mouth every morning.  3   gabapentin (NEURONTIN) 300 MG capsule Take 1 capsule (300 mg total) by mouth 3 (three) times daily as needed. 90 capsule 3   HYDROcodone-acetaminophen (NORCO/VICODIN) 5-325 MG per tablet      KRILL OIL PO Take by mouth.     lisinopril-hydrochlorothiazide (PRINZIDE,ZESTORETIC) 10-12.5 MG per tablet Take 1 tablet by mouth every morning.     pantoprazole (PROTONIX) 40 MG tablet Take 40 mg by mouth daily.     Probiotic Product (PROBIOTIC DAILY PO) Take by mouth.     No current facility-administered  medications for this visit.     PHYSICAL EXAMINATION: ECOG PERFORMANCE STATUS: 0 - Asymptomatic  There were no vitals filed for this visit.  There were no vitals filed for this visit.   GENERAL:alert, no distress and comfortable SKIN: skin color, texture, turgor are normal, no rashes or significant lesions EYES: normal, conjunctiva are pink and non-injected, sclera clear OROPHARYNX:no exudate, no erythema and lips, buccal mucosa, and tongue normal  NECK: supple, thyroid normal size, non-tender, without nodularity LYMPH:  no palpable lymphadenopathy in the cervical, axillary  LUNGS: clear to auscultation and percussion with normal breathing effort HEART: regular rate & rhythm and no murmurs and no lower extremity edema ABDOMEN:abdomen soft, non-tender and normal bowel sounds, ? Mild splenomegaly, tip of spleen palpated on deep inspiration. Musculoskeletal:no cyanosis of digits and no clubbing  PSYCH: alert & oriented x 3 with fluent  speech NEURO: no focal motor/sensory deficits  LABORATORY DATA:  I have reviewed the data as listed Lab Results  Component Value Date   WBC 8.4 07/05/2023   HGB 11.6 (L) 07/05/2023   HCT 34.8 (L) 07/05/2023   MCV 90.6 07/05/2023   PLT 715 (H) 07/05/2023     Chemistry      Component Value Date/Time   NA 138 07/05/2023 1100   K 4.4 07/05/2023 1100   CL 104 07/05/2023 1100   CO2 29 07/05/2023 1100   BUN 24 (H) 07/05/2023 1100   CREATININE 0.91 07/05/2023 1100      Component Value Date/Time   CALCIUM 9.1 07/05/2023 1100   ALKPHOS 67 07/05/2023 1100   AST 16 07/05/2023 1100   ALT 15 07/05/2023 1100   BILITOT 0.4 07/05/2023 1100       RADIOGRAPHIC STUDIES: I have personally reviewed the radiological images as listed and agreed with the findings in the report. No results found.  All questions were answered. The patient knows to call the clinic with any problems, questions or concerns. I spent 45 minutes in the care of this patient including H and P, review of records, counseling and coordination of care.     [name], MD 07/08/2023 11:45 AM

## 2023-07-10 ENCOUNTER — Telehealth: Payer: Self-pay | Admitting: Hematology and Oncology

## 2023-07-10 LAB — BCR-ABL1 FISH
Cells Analyzed: 200
Cells Counted: 200

## 2023-07-10 NOTE — Telephone Encounter (Signed)
Spoke with patient confirming upcoming appointment

## 2023-07-11 LAB — MISC LABCORP TEST (SEND OUT): Labcorp test code: 489514

## 2023-07-21 ENCOUNTER — Telehealth: Payer: Self-pay

## 2023-07-21 NOTE — Telephone Encounter (Signed)
 Spoke with patient to confirm appointment on 2/11 with Dr Arno Bibles.  Patient confirmed and understood.

## 2023-07-22 ENCOUNTER — Encounter: Payer: Self-pay | Admitting: *Deleted

## 2023-07-22 ENCOUNTER — Other Ambulatory Visit: Payer: Self-pay | Admitting: *Deleted

## 2023-07-22 ENCOUNTER — Encounter: Payer: Self-pay | Admitting: Hematology and Oncology

## 2023-07-22 ENCOUNTER — Inpatient Hospital Stay: Payer: Medicare Other

## 2023-07-22 ENCOUNTER — Inpatient Hospital Stay: Payer: Medicare Other | Attending: Hematology and Oncology | Admitting: Hematology and Oncology

## 2023-07-22 ENCOUNTER — Telehealth: Payer: Self-pay | Admitting: *Deleted

## 2023-07-22 VITALS — BP 108/59 | HR 85 | Temp 97.7°F | Resp 16 | Wt 189.4 lb

## 2023-07-22 DIAGNOSIS — G629 Polyneuropathy, unspecified: Secondary | ICD-10-CM | POA: Insufficient documentation

## 2023-07-22 DIAGNOSIS — D473 Essential (hemorrhagic) thrombocythemia: Secondary | ICD-10-CM | POA: Diagnosis not present

## 2023-07-22 DIAGNOSIS — D75839 Thrombocytosis, unspecified: Secondary | ICD-10-CM | POA: Diagnosis not present

## 2023-07-22 DIAGNOSIS — D649 Anemia, unspecified: Secondary | ICD-10-CM | POA: Insufficient documentation

## 2023-07-22 DIAGNOSIS — Z79899 Other long term (current) drug therapy: Secondary | ICD-10-CM | POA: Diagnosis not present

## 2023-07-22 LAB — CBC WITH DIFFERENTIAL/PLATELET
Abs Immature Granulocytes: 0.03 10*3/uL (ref 0.00–0.07)
Basophils Absolute: 0.1 10*3/uL (ref 0.0–0.1)
Basophils Relative: 1 %
Eosinophils Absolute: 0.4 10*3/uL (ref 0.0–0.5)
Eosinophils Relative: 5 %
HCT: 34.9 % — ABNORMAL LOW (ref 36.0–46.0)
Hemoglobin: 11.3 g/dL — ABNORMAL LOW (ref 12.0–15.0)
Immature Granulocytes: 0 %
Lymphocytes Relative: 37 %
Lymphs Abs: 3.1 10*3/uL (ref 0.7–4.0)
MCH: 29.1 pg (ref 26.0–34.0)
MCHC: 32.4 g/dL (ref 30.0–36.0)
MCV: 89.9 fL (ref 80.0–100.0)
Monocytes Absolute: 0.7 10*3/uL (ref 0.1–1.0)
Monocytes Relative: 8 %
Neutro Abs: 4.1 10*3/uL (ref 1.7–7.7)
Neutrophils Relative %: 49 %
Platelets: 702 10*3/uL — ABNORMAL HIGH (ref 150–400)
RBC: 3.88 MIL/uL (ref 3.87–5.11)
RDW: 13.6 % (ref 11.5–15.5)
WBC: 8.4 10*3/uL (ref 4.0–10.5)
nRBC: 0 % (ref 0.0–0.2)

## 2023-07-22 LAB — IRON AND IRON BINDING CAPACITY (CC-WL,HP ONLY)
Iron: 39 ug/dL (ref 28–170)
Saturation Ratios: 8 % — ABNORMAL LOW (ref 10.4–31.8)
TIBC: 496 ug/dL — ABNORMAL HIGH (ref 250–450)
UIBC: 457 ug/dL — ABNORMAL HIGH (ref 148–442)

## 2023-07-22 LAB — FERRITIN: Ferritin: 6 ng/mL — ABNORMAL LOW (ref 11–307)

## 2023-07-22 NOTE — Assessment & Plan Note (Signed)
Chronic Thrombocytosis Persistent high platelet count over the past 12 years. No evidence of essential thrombocytosis or chronic myeloid leukemia from blood work. Discussed the need for bone marrow biopsy to rule out bone marrow disorder. -Schedule bone marrow biopsy in the coming weeks. -Continue baby aspirin.  Peripheral Neuropathy Chronic neuropathy managed with Gabapentin 300mg  TID. Discussed potential contribution of daily alcohol consumption. -Continue Gabapentin 300mg  TID. -Advise reduction in alcohol consumption.  Anemia Mild anemia potentially related to blood donation. Discussed the need to limit blood donation to single unit. -Limit blood donation to single unit. -Check iron levels today.  Fatigue Chronic fatigue potentially related to Gabapentin use and mild anemia. -Monitor for improvement with reduction in alcohol consumption and limitation of blood donation to single unit.

## 2023-07-22 NOTE — Progress Notes (Signed)
[facility] CONSULT NOTE  Patient Care Team: Clovis Riley, L.[name], MD (Inactive) as PCP - General (Family Medicine)  CHIEF COMPLAINTS/PURPOSE OF CONSULTATION:  Essential thrombocytosis.  ASSESSMENT & PLAN:   Thrombocytosis Chronic Thrombocytosis Persistent high platelet count over the past 12 years. No evidence of essential thrombocytosis or chronic myeloid leukemia from blood work. Discussed the need for bone marrow biopsy to rule out bone marrow disorder. -Schedule bone marrow biopsy in the coming weeks. -Continue baby aspirin.  Peripheral Neuropathy Chronic neuropathy managed with Gabapentin 300mg  TID. Discussed potential contribution of daily alcohol consumption. -Continue Gabapentin 300mg  TID. -Advise reduction in alcohol consumption.  Anemia Mild anemia potentially related to blood donation. Discussed the need to limit blood donation to single unit. -Limit blood donation to single unit. -Check iron levels today.  Fatigue Chronic fatigue potentially related to Gabapentin use and mild anemia. -Monitor for improvement with reduction in alcohol consumption and limitation of blood donation to single unit.   HISTORY OF PRESENTING ILLNESS:  [name] 68 y.o. female is here because of thrombocytosis.  Discussed the use of AI scribe software for clinical note transcription with the patient, who gave verbal consent to proceed.  History of Present Illness    [name] is a 68 year old female with chronic thrombocytosis who presents with high platelet count.  She has experienced persistently elevated platelet counts for at least 12 years, with the most recent count at 715,000 and a previous count of 736,000 eleven years ago. Her platelet count has consistently remained elevated over the past 12 years. Previous blood work, including JAK2 testing and fusion transcript for chronic myeloid leukemia, returned negative results, indicating no significant mutations  or abnormalities.  She experiences significant fatigue and is mildly anemic with a hemoglobin level of 11.6. She attributes some of her anemia to donating blood, specifically through Power Red donations, which involve giving two units at a time. She feels lightheaded after these donations.  She has a history of neuropathy, initially suspected to be related to a B12 deficiency. She takes B12 supplements, resulting in high B12 levels. She is on gabapentin 300 mg three times a day, which helps manage her neuropathy symptoms, particularly the burning sensation in her feet. Her right foot is always somewhat numb. There is a family history of neuropathy, as her father had severe neuropathy in his feet.  Her social history reveals regular alcohol consumption, specifically three to four margaritas daily, which might contribute to her neuropathy.  Rest of the pertinent 10 point ROS reviewed and neg.  REVIEW OF SYSTEMS:   Constitutional: Denies fevers, chills or abnormal night sweats Eyes: Denies blurriness of vision, double vision or watery eyes Ears, nose, mouth, throat, and face: Denies mucositis or sore throat Respiratory: Denies cough, dyspnea or wheezes Cardiovascular: Denies palpitation, chest discomfort or lower extremity swelling Gastrointestinal:  Denies nausea, heartburn or change in bowel habits Skin: Denies abnormal skin rashes Lymphatics: Denies new lymphadenopathy or easy bruising Neurological:Denies numbness, tingling or new weaknesses Behavioral/Psych: Mood is stable, no new changes  All other systems were reviewed with the patient and are negative.  MEDICAL HISTORY:  Past Medical History:  Diagnosis Date   Anxiety    Arthritis    Atrophic vaginitis    Depression    GERD (gastroesophageal reflux disease)    GERD (gastroesophageal reflux disease)    Hypertension    Osteoarthritis    Seasonal allergies    Sleep apnea     SURGICAL HISTORY:  Past Surgical History:  Procedure  Laterality Date    fistula repair x 3  10 yrs ago   ABDOMINAL HYSTERECTOMY  yrs ago   COLONOSCOPY     JOINT REPLACEMENT  2012   right   right knee arthroscopic surgery  yrs ago   right rotator cuff repair  8 yrs    TOTAL KNEE ARTHROPLASTY  04/14/2012   Procedure: TOTAL KNEE ARTHROPLASTY;  Surgeon: [name], MD;  Location: [facility];  Service: Orthopedics;  Laterality: Left;    SOCIAL HISTORY: Social History   Socioeconomic History   Marital status: Married    Spouse name: Not on file   Number of children: Not on file   Years of education: Not on file   Highest education level: Not on file  Occupational History   Not on file  Tobacco Use   Smoking status: Former    Current packs/day: 0.00    Average packs/day: 0.3 packs/day for 20.0 years (5.0 ttl pk-yrs)    Types: Cigarettes    Start date: 06/10/1986    Quit date: 06/10/2006    Years since quitting: 17.1   Smokeless tobacco: Never  Substance and Sexual Activity   Alcohol use: Yes    Alcohol/week: 2.0 standard drinks of alcohol    Types: 2 Glasses of wine per week   Drug use: Yes    Types: Marijuana    Comment: marijuan use 30 yrs ago, none current   Sexual activity: Not on file  Other Topics Concern   Not on file  Social History Narrative   Not on file   Social Drivers of Health   Financial Resource Strain: Not on file  Food Insecurity: Not on file  Transportation Needs: Not on file  Physical Activity: Not on file  Stress: Not on file  Social Connections: Not on file  Intimate Partner Violence: Not on file    FAMILY HISTORY: Family History  Problem Relation Age of Onset   Diabetes Maternal Grandfather     ALLERGIES:  is allergic to morphine and codeine and iodine.  MEDICATIONS:  Current Outpatient Medications  Medication Sig Dispense Refill   ALPRAZolam (XANAX) 0.25 MG tablet Take 0.25 mg by mouth as needed. anxiety     Cholecalciferol (VITAMIN D PO) Take by mouth.     FLUoxetine (PROZAC) 20 MG  capsule Take 20 mg by mouth every morning.  3   gabapentin (NEURONTIN) 300 MG capsule Take 1 capsule (300 mg total) by mouth 3 (three) times daily as needed. 90 capsule 3   HYDROcodone-acetaminophen (NORCO/VICODIN) 5-325 MG per tablet      KRILL OIL PO Take by mouth.     lisinopril-hydrochlorothiazide (PRINZIDE,ZESTORETIC) 10-12.5 MG per tablet Take 1 tablet by mouth every morning.     pantoprazole (PROTONIX) 40 MG tablet Take 40 mg by mouth daily.     Probiotic Product (PROBIOTIC DAILY PO) Take by mouth.     No current facility-administered medications for this visit.     PHYSICAL EXAMINATION: ECOG PERFORMANCE STATUS: 0 - Asymptomatic  Vitals:   07/22/23 0930  BP: (!) 108/59  Pulse: 85  Resp: 16  Temp: 97.7 F (36.5 C)  SpO2: 97%   Filed Weights   07/22/23 0930  Weight: 189 lb 6.4 oz (85.9 kg)    GENERAL:alert, no distress and comfortable SKIN: skin color, texture, turgor are normal, no rashes or significant lesions EYES: normal, conjunctiva are pink and non-injected, sclera clear OROPHARYNX:no exudate, no erythema and lips, buccal  mucosa, and tongue normal  NECK: supple, thyroid normal size, non-tender, without nodularity LYMPH:  no palpable lymphadenopathy in the cervical, axillary  LUNGS: clear to auscultation and percussion with normal breathing effort HEART: regular rate & rhythm and no murmurs and no lower extremity edema ABDOMEN:abdomen soft, non-tender and normal bowel sounds, ? Mild splenomegaly, tip of spleen palpated on deep inspiration. Musculoskeletal:no cyanosis of digits and no clubbing  PSYCH: alert & oriented x 3 with fluent speech NEURO: no focal motor/sensory deficits  LABORATORY DATA:  I have reviewed the data as listed Lab Results  Component Value Date   WBC 8.4 07/05/2023   HGB 11.6 (L) 07/05/2023   HCT 34.8 (L) 07/05/2023   MCV 90.6 07/05/2023   PLT 715 (H) 07/05/2023     Chemistry      Component Value Date/Time   NA 138 07/05/2023 1100    K 4.4 07/05/2023 1100   CL 104 07/05/2023 1100   CO2 29 07/05/2023 1100   BUN 24 (H) 07/05/2023 1100   CREATININE 0.91 07/05/2023 1100      Component Value Date/Time   CALCIUM 9.1 07/05/2023 1100   ALKPHOS 67 07/05/2023 1100   AST 16 07/05/2023 1100   ALT 15 07/05/2023 1100   BILITOT 0.4 07/05/2023 1100       RADIOGRAPHIC STUDIES: I have personally reviewed the radiological images as listed and agreed with the findings in the report. No results found.  All questions were answered. The patient knows to call the clinic with any problems, questions or concerns. I spent 30 minutes in the care of this patient including H and P, review of records, counseling and coordination of care.     [name], MD 07/22/2023 10:17 AM

## 2023-07-22 NOTE — Telephone Encounter (Signed)
Bone Marrow biopsy scheduled on 08/04/2023 in bed at 8am with [facility] DNP at 830 am.  Flow cytometry notified and scheduled.  This RN left a VM on identified number for pt as well as sent a My Chart message detailing procedure including date and time with request to return confirmation.

## 2023-07-23 ENCOUNTER — Telehealth: Payer: Self-pay | Admitting: *Deleted

## 2023-07-23 NOTE — Telephone Encounter (Signed)
Per MD request post her review of labs - recommended to hold on BMBX at present - and pt needs to take iron supplement for 30 days and then return to office with labs.  This RN called pt discussed and canceled BMBX appts including contacting flow.  Appt request sent to schedule follow up.

## 2023-07-23 NOTE — Telephone Encounter (Signed)
-----   Message from Bergland Iruku sent at 07/22/2023  6:17 PM EST ----- She is iron deficient, can we hold bone marrow for now. I want her to start taking ferrous sulfate 65 mg po daily for a month and come see me with labs please.  Thanks,

## 2023-07-24 ENCOUNTER — Telehealth: Payer: Self-pay | Admitting: Hematology and Oncology

## 2023-07-24 NOTE — Telephone Encounter (Signed)
Scheduled per 2/12 scheduling message. Patient is aware of the made appointments.

## 2023-08-04 ENCOUNTER — Other Ambulatory Visit: Payer: BLUE CROSS/BLUE SHIELD

## 2023-08-19 ENCOUNTER — Inpatient Hospital Stay: Attending: Hematology and Oncology

## 2023-08-19 DIAGNOSIS — D75839 Thrombocytosis, unspecified: Secondary | ICD-10-CM | POA: Insufficient documentation

## 2023-08-19 DIAGNOSIS — D649 Anemia, unspecified: Secondary | ICD-10-CM | POA: Diagnosis not present

## 2023-08-19 DIAGNOSIS — Z87891 Personal history of nicotine dependence: Secondary | ICD-10-CM | POA: Insufficient documentation

## 2023-08-19 LAB — CBC WITH DIFFERENTIAL/PLATELET
Abs Immature Granulocytes: 0.03 10*3/uL (ref 0.00–0.07)
Basophils Absolute: 0.1 10*3/uL (ref 0.0–0.1)
Basophils Relative: 1 %
Eosinophils Absolute: 0.3 10*3/uL (ref 0.0–0.5)
Eosinophils Relative: 3 %
HCT: 41.2 % (ref 36.0–46.0)
Hemoglobin: 13.5 g/dL (ref 12.0–15.0)
Immature Granulocytes: 0 %
Lymphocytes Relative: 27 %
Lymphs Abs: 2.5 10*3/uL (ref 0.7–4.0)
MCH: 29 pg (ref 26.0–34.0)
MCHC: 32.8 g/dL (ref 30.0–36.0)
MCV: 88.4 fL (ref 80.0–100.0)
Monocytes Absolute: 0.6 10*3/uL (ref 0.1–1.0)
Monocytes Relative: 7 %
Neutro Abs: 5.9 10*3/uL (ref 1.7–7.7)
Neutrophils Relative %: 62 %
Platelets: 762 10*3/uL — ABNORMAL HIGH (ref 150–400)
RBC: 4.66 MIL/uL (ref 3.87–5.11)
RDW: 15.8 % — ABNORMAL HIGH (ref 11.5–15.5)
WBC: 9.4 10*3/uL (ref 4.0–10.5)
nRBC: 0 % (ref 0.0–0.2)

## 2023-08-20 ENCOUNTER — Telehealth: Payer: BLUE CROSS/BLUE SHIELD | Admitting: Hematology and Oncology

## 2023-08-20 ENCOUNTER — Other Ambulatory Visit: Payer: BLUE CROSS/BLUE SHIELD

## 2023-08-21 ENCOUNTER — Telehealth: Payer: Self-pay

## 2023-08-21 NOTE — Telephone Encounter (Signed)
 Left message on voicemail for patient about appointment on 08/22/23

## 2023-08-22 ENCOUNTER — Inpatient Hospital Stay: Payer: BLUE CROSS/BLUE SHIELD | Admitting: Hematology and Oncology

## 2023-08-22 VITALS — BP 127/45 | HR 80 | Temp 97.7°F | Resp 17 | Wt 184.0 lb

## 2023-08-22 DIAGNOSIS — D75839 Thrombocytosis, unspecified: Secondary | ICD-10-CM

## 2023-08-22 NOTE — Assessment & Plan Note (Signed)
 Chronic Thrombocytosis Persistent high platelet count over the past 12 years. No evidence of essential thrombocytosis or chronic myeloid leukemia from blood work. Started iron supplementation, 325 mg every other day, ok to take it daily I will do the trial of iron for the next 3/4 months and see if this can help the thrombocytosis. If no improvement despite iron supplementation, we discussed about considering BMB  Peripheral Neuropathy Chronic neuropathy managed with Gabapentin 300mg  TID.  Likely from alcohol use.  Anemia Likely secondary to power red donation. No complaints of GI blood loss This has resolved We will continue to monitor.  [name] MD

## 2023-08-22 NOTE — Progress Notes (Signed)
 [facility] CONSULT NOTE  Patient Care Team: Clovis Riley, L.[name], MD (Inactive) as PCP - General (Family Medicine)  CHIEF COMPLAINTS/PURPOSE OF CONSULTATION:  Essential thrombocytosis.  ASSESSMENT & PLAN:   Thrombocytosis Chronic Thrombocytosis Persistent high platelet count over the past 12 years. No evidence of essential thrombocytosis or chronic myeloid leukemia from blood work. Started iron supplementation, 325 mg every other day, ok to take it daily I will do the trial of iron for the next 3/4 months and see if this can help the thrombocytosis. If no improvement despite iron supplementation, we discussed about considering BMB  Peripheral Neuropathy Chronic neuropathy managed with Gabapentin 300mg  TID.  Likely from alcohol use.  Anemia Likely secondary to power red donation. No complaints of GI blood loss This has resolved We will continue to monitor.  [name] MD    HISTORY OF PRESENTING ILLNESS:  [name] 68 y.o. female is here because of thrombocytosis.  Discussed the use of AI scribe software for clinical note transcription with the patient, who gave verbal consent to proceed.  History of Present Illness    [name] is a 68 year old female with thrombocytosis and anemia who presents for follow-up on her iron therapy and platelet count. Previous JAK 2 and BCR ABL neg   She has been managing anemia with iron supplements every other day since her last appointment a month ago. Her hemoglobin levels have improved from 11.3 to 13.5. She has a history of blood donation for about a year, which may have contributed to her low iron levels. No bleeding or pica symptoms are present.  Her platelet count remains elevated at 762,000, a condition she has been dealing with for several years. She has not experienced any blood clots and continues to take a baby aspirin daily. No new symptoms related to her thrombocytosis have been noted.  No new  symptoms except for neuropathy discussed in a previous visit. She feels tired but states it is not debilitating and she can continue her daily activities.  Her current medications include Xanax, vitamin D, Prozac, gabapentin, Norco as needed, lisinopril, hydrochlorothiazide, Protonix, and now iron supplements. Her medication regimen remains unchanged.  Rest of the pertinent 10 point ROS reviewed and neg.  REVIEW OF SYSTEMS:   Constitutional: Denies fevers, chills or abnormal night sweats Eyes: Denies blurriness of vision, double vision or watery eyes Ears, nose, mouth, throat, and face: Denies mucositis or sore throat Respiratory: Denies cough, dyspnea or wheezes Cardiovascular: Denies palpitation, chest discomfort or lower extremity swelling Gastrointestinal:  Denies nausea, heartburn or change in bowel habits Skin: Denies abnormal skin rashes Lymphatics: Denies new lymphadenopathy or easy bruising Neurological:Denies numbness, tingling or new weaknesses Behavioral/Psych: Mood is stable, no new changes  All other systems were reviewed with the patient and are negative.  MEDICAL HISTORY:  Past Medical History:  Diagnosis Date   Anxiety    Arthritis    Atrophic vaginitis    Depression    GERD (gastroesophageal reflux disease)    GERD (gastroesophageal reflux disease)    Hypertension    Osteoarthritis    Seasonal allergies    Sleep apnea     SURGICAL HISTORY: Past Surgical History:  Procedure Laterality Date    fistula repair x 3  10 yrs ago   ABDOMINAL HYSTERECTOMY  yrs ago   COLONOSCOPY     JOINT REPLACEMENT  2012   right   right knee arthroscopic surgery  yrs ago   right rotator cuff  repair  8 yrs    TOTAL KNEE ARTHROPLASTY  04/14/2012   Procedure: TOTAL KNEE ARTHROPLASTY;  Surgeon: [name], MD;  Location: [facility];  Service: Orthopedics;  Laterality: Left;    SOCIAL HISTORY: Social History   Socioeconomic History   Marital status: Married    Spouse name: Not  on file   Number of children: Not on file   Years of education: Not on file   Highest education level: Not on file  Occupational History   Not on file  Tobacco Use   Smoking status: Former    Current packs/day: 0.00    Average packs/day: 0.3 packs/day for 20.0 years (5.0 ttl pk-yrs)    Types: Cigarettes    Start date: 06/10/1986    Quit date: 06/10/2006    Years since quitting: 17.2   Smokeless tobacco: Never  Substance and Sexual Activity   Alcohol use: Yes    Alcohol/week: 2.0 standard drinks of alcohol    Types: 2 Glasses of wine per week   Drug use: Yes    Types: Marijuana    Comment: marijuan use 30 yrs ago, none current   Sexual activity: Not on file  Other Topics Concern   Not on file  Social History Narrative   Not on file   Social Drivers of Health   Financial Resource Strain: Not on file  Food Insecurity: Not on file  Transportation Needs: Not on file  Physical Activity: Not on file  Stress: Not on file  Social Connections: Not on file  Intimate Partner Violence: Not on file    FAMILY HISTORY: Family History  Problem Relation Age of Onset   Diabetes Maternal Grandfather     ALLERGIES:  is allergic to morphine and codeine and iodine.  MEDICATIONS:  Current Outpatient Medications  Medication Sig Dispense Refill   ferrous sulfate 324 MG TBEC Take 324 mg by mouth.     ALPRAZolam (XANAX) 0.25 MG tablet Take 0.25 mg by mouth as needed. anxiety     FLUoxetine (PROZAC) 20 MG capsule Take 20 mg by mouth every morning.  3   gabapentin (NEURONTIN) 300 MG capsule Take 1 capsule (300 mg total) by mouth 3 (three) times daily as needed. 90 capsule 3   HYDROcodone-acetaminophen (NORCO/VICODIN) 5-325 MG per tablet      KRILL OIL PO Take by mouth.     lisinopril-hydrochlorothiazide (PRINZIDE,ZESTORETIC) 10-12.5 MG per tablet Take 1 tablet by mouth every morning.     pantoprazole (PROTONIX) 40 MG tablet Take 40 mg by mouth daily.     Probiotic Product (PROBIOTIC DAILY PO)  Take by mouth.     No current facility-administered medications for this visit.     PHYSICAL EXAMINATION: ECOG PERFORMANCE STATUS: 0 - Asymptomatic  Vitals:   08/22/23 1221  BP: (!) 127/45  Pulse: 80  Resp: 17  Temp: 97.7 F (36.5 C)  SpO2: 95%   Filed Weights   08/22/23 1221  Weight: 184 lb (83.5 kg)    GENERAL:alert, no distress and comfortable SKIN: skin color, texture, turgor are normal, no rashes or significant lesions EYES: normal, conjunctiva are pink and non-injected, sclera clear OROPHARYNX:no exudate, no erythema and lips, buccal mucosa, and tongue normal  NECK: supple, thyroid normal size, non-tender, without nodularity LYMPH:  no palpable lymphadenopathy in the cervical, axillary  LUNGS: clear to auscultation and percussion with normal breathing effort HEART: regular rate & rhythm and no murmurs and no lower extremity edema ABDOMEN:abdomen soft, non-tender and normal bowel  sounds Musculoskeletal:no cyanosis of digits and no clubbing  PSYCH: alert & oriented x 3 with fluent speech NEURO: no focal motor/sensory deficits  LABORATORY DATA:  I have reviewed the data as listed Lab Results  Component Value Date   WBC 9.4 08/19/2023   HGB 13.5 08/19/2023   HCT 41.2 08/19/2023   MCV 88.4 08/19/2023   PLT 762 (H) 08/19/2023     Chemistry      Component Value Date/Time   NA 138 07/05/2023 1100   K 4.4 07/05/2023 1100   CL 104 07/05/2023 1100   CO2 29 07/05/2023 1100   BUN 24 (H) 07/05/2023 1100   CREATININE 0.91 07/05/2023 1100      Component Value Date/Time   CALCIUM 9.1 07/05/2023 1100   ALKPHOS 67 07/05/2023 1100   AST 16 07/05/2023 1100   ALT 15 07/05/2023 1100   BILITOT 0.4 07/05/2023 1100       RADIOGRAPHIC STUDIES: I have personally reviewed the radiological images as listed and agreed with the findings in the report. No results found.  All questions were answered. The patient knows to call the clinic with any problems, questions or  concerns. I spent 30 minutes in the care of this patient including H and P, review of records, counseling and coordination of care.     [name], MD 08/22/2023 1:32 PM

## 2023-09-23 ENCOUNTER — Telehealth: Payer: Self-pay | Admitting: Hematology and Oncology

## 2023-09-24 ENCOUNTER — Other Ambulatory Visit: Payer: Self-pay | Admitting: Family Medicine

## 2023-09-25 NOTE — Telephone Encounter (Signed)
 Rx refill request approved per Dr. Zollie Pee orders.

## 2023-10-25 ENCOUNTER — Other Ambulatory Visit: Payer: Self-pay | Admitting: Family Medicine

## 2023-10-27 NOTE — Telephone Encounter (Signed)
 Last OV 05/14/23 Next OV not scheduled  Last refill 09/25/23 Qty #90/0

## 2023-11-17 ENCOUNTER — Telehealth: Payer: Self-pay

## 2023-11-17 NOTE — Telephone Encounter (Signed)
 Left message to confirm appt for 6/10

## 2023-11-18 ENCOUNTER — Inpatient Hospital Stay: Attending: Hematology and Oncology

## 2023-11-18 ENCOUNTER — Inpatient Hospital Stay: Admitting: Hematology and Oncology

## 2023-11-18 DIAGNOSIS — D75839 Thrombocytosis, unspecified: Secondary | ICD-10-CM | POA: Insufficient documentation

## 2023-11-18 DIAGNOSIS — Z87891 Personal history of nicotine dependence: Secondary | ICD-10-CM | POA: Insufficient documentation

## 2023-11-18 DIAGNOSIS — D509 Iron deficiency anemia, unspecified: Secondary | ICD-10-CM | POA: Insufficient documentation

## 2023-11-21 ENCOUNTER — Other Ambulatory Visit

## 2023-11-21 ENCOUNTER — Ambulatory Visit: Admitting: Hematology and Oncology

## 2023-11-24 ENCOUNTER — Telehealth: Payer: Self-pay

## 2023-11-24 NOTE — Telephone Encounter (Signed)
 Verbally confirmed appts for 6/17

## 2023-11-25 ENCOUNTER — Inpatient Hospital Stay (HOSPITAL_BASED_OUTPATIENT_CLINIC_OR_DEPARTMENT_OTHER): Admitting: Hematology and Oncology

## 2023-11-25 ENCOUNTER — Inpatient Hospital Stay

## 2023-11-25 VITALS — BP 143/70 | HR 78 | Temp 97.9°F | Resp 17 | Wt 183.7 lb

## 2023-11-25 DIAGNOSIS — D75839 Thrombocytosis, unspecified: Secondary | ICD-10-CM

## 2023-11-25 DIAGNOSIS — D473 Essential (hemorrhagic) thrombocythemia: Secondary | ICD-10-CM | POA: Diagnosis present

## 2023-11-25 DIAGNOSIS — Z87891 Personal history of nicotine dependence: Secondary | ICD-10-CM | POA: Diagnosis not present

## 2023-11-25 DIAGNOSIS — D509 Iron deficiency anemia, unspecified: Secondary | ICD-10-CM | POA: Diagnosis not present

## 2023-11-25 LAB — CBC WITH DIFFERENTIAL/PLATELET
Abs Immature Granulocytes: 0.03 10*3/uL (ref 0.00–0.07)
Basophils Absolute: 0.1 10*3/uL (ref 0.0–0.1)
Basophils Relative: 1 %
Eosinophils Absolute: 0.3 10*3/uL (ref 0.0–0.5)
Eosinophils Relative: 4 %
HCT: 42.6 % (ref 36.0–46.0)
Hemoglobin: 14.6 g/dL (ref 12.0–15.0)
Immature Granulocytes: 0 %
Lymphocytes Relative: 32 %
Lymphs Abs: 2.4 10*3/uL (ref 0.7–4.0)
MCH: 30.7 pg (ref 26.0–34.0)
MCHC: 34.3 g/dL (ref 30.0–36.0)
MCV: 89.7 fL (ref 80.0–100.0)
Monocytes Absolute: 0.6 10*3/uL (ref 0.1–1.0)
Monocytes Relative: 8 %
Neutro Abs: 4 10*3/uL (ref 1.7–7.7)
Neutrophils Relative %: 55 %
Platelets: 641 10*3/uL — ABNORMAL HIGH (ref 150–400)
RBC: 4.75 MIL/uL (ref 3.87–5.11)
RDW: 15.2 % (ref 11.5–15.5)
WBC: 7.3 10*3/uL (ref 4.0–10.5)
nRBC: 0 % (ref 0.0–0.2)

## 2023-11-25 LAB — IRON AND IRON BINDING CAPACITY (CC-WL,HP ONLY)
Iron: 107 ug/dL (ref 28–170)
Saturation Ratios: 26 % (ref 10.4–31.8)
TIBC: 409 ug/dL (ref 250–450)
UIBC: 302 ug/dL (ref 148–442)

## 2023-11-25 LAB — FERRITIN: Ferritin: 33 ng/mL (ref 11–307)

## 2023-11-25 NOTE — Assessment & Plan Note (Signed)
 Assessment and Plan Assessment & Plan Thrombocytosis Chronic thrombocytosis with platelet count at 641,000. Differential includes essential thrombocytosis.  -MPN testing neg so far, during her last visit we have suggested replenishment of iron stores and follow-up on the platelet count to see if it still correct.  She has been now on iron supplementation, ferritin has improved from 6-33 however platelet elevation persists.  Since the diagnosis of ET may change management, we have agreed to consider bone marrow aspiration and biopsy. - Continue baby aspirin  in the interim. - Consider hydroxyurea if bone marrow biopsy confirms essential thrombocytosis.  Iron deficiency anemia Iron deficiency anemia with improved hemoglobin to 14.6. Iron panel and ferritin improved. Continue oral iron supplement, target ferritin is 100.

## 2023-11-25 NOTE — Progress Notes (Signed)
 [facility] CONSULT NOTE  Patient Care Team: Brenita Callow, L.Rozelle Corning, MD (Inactive) as PCP - General (Family Medicine)  CHIEF COMPLAINTS/PURPOSE OF CONSULTATION:  Essential thrombocytosis.  ASSESSMENT & PLAN:   Thrombocytosis Assessment and Plan Assessment & Plan Thrombocytosis Chronic thrombocytosis with platelet count at 641,000. Differential includes essential thrombocytosis.  -MPN testing neg so far, during her last visit we have suggested replenishment of iron stores and follow-up on the platelet count to see if it still correct.  She has been now on iron supplementation, ferritin has improved from 6-33 however platelet elevation persists.  Since the diagnosis of ET may change management, we have agreed to consider bone marrow aspiration and biopsy. - Continue baby aspirin  in the interim. - Consider hydroxyurea if bone marrow biopsy confirms essential thrombocytosis.  Iron deficiency anemia Iron deficiency anemia with improved hemoglobin to 14.6. Iron panel and ferritin improved. Continue oral iron supplement, target ferritin is 100.    HISTORY OF PRESENTING ILLNESS:  [name] 68 y.o. female is here because of thrombocytosis.  Discussed the use of AI scribe software for clinical note transcription with the patient, who gave verbal consent to proceed.  History of Present Illness    History of Present Illness  [name] is a 68 year old female who presents for follow-up on her blood work and management of thrombocytosis.  She is currently taking an iron supplement and baby aspirin . There has been no noticeable change in her condition since starting the iron supplement, as she did not feel unwell prior to its initiation. Her hemoglobin levels have increased from 11 to 14.6. Her platelet count is currently 641,000, with previous counts of 762,000 and 715,000.  She has a family history of high white blood cell count in her father, but no known family  history of thrombocytosis. She has no known connection to hereditary polycythemia.  No fevers, night sweats, changes in appetite, or weight loss. She has not required hospitalization recently and is compliant with her current medications.  Rest of the pertinent 10 point ROS reviewed and neg.  All other systems were reviewed with the patient and are negative.  MEDICAL HISTORY:  Past Medical History:  Diagnosis Date   Anxiety    Arthritis    Atrophic vaginitis    Depression    GERD (gastroesophageal reflux disease)    GERD (gastroesophageal reflux disease)    Hypertension    Osteoarthritis    Seasonal allergies    Sleep apnea     SURGICAL HISTORY: Past Surgical History:  Procedure Laterality Date    fistula repair x 3  10 yrs ago   ABDOMINAL HYSTERECTOMY  yrs ago   COLONOSCOPY     JOINT REPLACEMENT  2012   right   right knee arthroscopic surgery  yrs ago   right rotator cuff repair  8 yrs    TOTAL KNEE ARTHROPLASTY  04/14/2012   Procedure: TOTAL KNEE ARTHROPLASTY;  Surgeon: Bevin Bucks, MD;  Location: [facility];  Service: Orthopedics;  Laterality: Left;    SOCIAL HISTORY: Social History   Socioeconomic History   Marital status: Married    Spouse name: Not on file   Number of children: Not on file   Years of education: Not on file   Highest education level: Not on file  Occupational History   Not on file  Tobacco Use   Smoking status: Former    Current packs/day: 0.00    Average packs/day: 0.3 packs/day for 20.0 years (  5.0 ttl pk-yrs)    Types: Cigarettes    Start date: 06/10/1986    Quit date: 06/10/2006    Years since quitting: 17.4   Smokeless tobacco: Never  Substance and Sexual Activity   Alcohol use: Yes    Alcohol/week: 2.0 standard drinks of alcohol    Types: 2 Glasses of wine per week   Drug use: Yes    Types: Marijuana    Comment: marijuan use 30 yrs ago, none current   Sexual activity: Not on file  Other Topics Concern   Not on file  Social  History Narrative   Not on file   Social Drivers of Health   Financial Resource Strain: Not on file  Food Insecurity: Not on file  Transportation Needs: Not on file  Physical Activity: Not on file  Stress: Not on file  Social Connections: Not on file  Intimate Partner Violence: Not on file    FAMILY HISTORY: Family History  Problem Relation Age of Onset   Diabetes Maternal Grandfather     ALLERGIES:  is allergic to morphine and codeine and iodine.  MEDICATIONS:  Current Outpatient Medications  Medication Sig Dispense Refill   ALPRAZolam  (XANAX ) 0.25 MG tablet Take 0.25 mg by mouth as needed. anxiety     ferrous sulfate  324 MG TBEC Take 324 mg by mouth.     FLUoxetine  (PROZAC ) 20 MG capsule Take 20 mg by mouth every morning.  3   gabapentin  (NEURONTIN ) 300 MG capsule Take 1 capsule by mouth three times daily as needed 90 capsule 2   HYDROcodone -acetaminophen  (NORCO/VICODIN) 5-325 MG per tablet      KRILL OIL PO Take by mouth.     lisinopril -hydrochlorothiazide  (PRINZIDE ,ZESTORETIC ) 10-12.5 MG per tablet Take 1 tablet by mouth every morning.     pantoprazole  (PROTONIX ) 40 MG tablet Take 40 mg by mouth daily.     Probiotic Product (PROBIOTIC DAILY PO) Take by mouth.     No current facility-administered medications for this visit.     PHYSICAL EXAMINATION: ECOG PERFORMANCE STATUS: 0 - Asymptomatic  Vitals:   11/25/23 0858 11/25/23 0859  BP: (!) 142/66 (!) 143/70  Pulse: 78   Resp: 17   Temp: 97.9 F (36.6 C)   SpO2: 99%    Filed Weights   11/25/23 0858  Weight: 183 lb 11.2 oz (83.3 kg)    GENERAL:alert, no distress and comfortable SKIN: skin color, texture, turgor are normal, no rashes or significant lesions EYES: normal, conjunctiva are pink and non-injected, sclera clear OROPHARYNX:no exudate, no erythema and lips, buccal mucosa, and tongue normal  NECK: supple, thyroid normal size, non-tender, without nodularity LYMPH:  no palpable lymphadenopathy in the  cervical, axillary  LUNGS: clear to auscultation and percussion with normal breathing effort HEART: regular rate & rhythm and no murmurs and no lower extremity edema ABDOMEN:abdomen soft, non-tender and normal bowel sounds Musculoskeletal:no cyanosis of digits and no clubbing  PSYCH: alert & oriented x 3 with fluent speech NEURO: no focal motor/sensory deficits  LABORATORY DATA:  I have reviewed the data as listed Lab Results  Component Value Date   WBC 7.3 11/25/2023   HGB 14.6 11/25/2023   HCT 42.6 11/25/2023   MCV 89.7 11/25/2023   PLT 641 (H) 11/25/2023     Chemistry      Component Value Date/Time   NA 138 07/05/2023 1100   K 4.4 07/05/2023 1100   CL 104 07/05/2023 1100   CO2 29 07/05/2023 1100   BUN 24 (H)  07/05/2023 1100   CREATININE 0.91 07/05/2023 1100      Component Value Date/Time   CALCIUM 9.1 07/05/2023 1100   ALKPHOS 67 07/05/2023 1100   AST 16 07/05/2023 1100   ALT 15 07/05/2023 1100   BILITOT 0.4 07/05/2023 1100       RADIOGRAPHIC STUDIES: I have personally reviewed the radiological images as listed and agreed with the findings in the report. No results found.  All questions were answered. The patient knows to call the clinic with any problems, questions or concerns. I spent 30 minutes in the care of this patient including H and P, review of records, counseling and coordination of care.     Murleen Arms, MD 11/25/2023 3:12 PM

## 2023-11-26 ENCOUNTER — Other Ambulatory Visit: Payer: Self-pay | Admitting: *Deleted

## 2023-11-26 ENCOUNTER — Telehealth: Payer: Self-pay | Admitting: *Deleted

## 2023-11-26 DIAGNOSIS — D75839 Thrombocytosis, unspecified: Secondary | ICD-10-CM

## 2023-11-26 NOTE — Telephone Encounter (Signed)
 Called pt to make aware of bone marrow appt on 7/2 at 830. Advised pt to arrival at least early. Advised that pt has a driver to come with her. Pt verbalized understanding.

## 2023-12-08 ENCOUNTER — Other Ambulatory Visit (HOSPITAL_COMMUNITY): Payer: Self-pay | Admitting: Family Medicine

## 2023-12-08 DIAGNOSIS — E7800 Pure hypercholesterolemia, unspecified: Secondary | ICD-10-CM

## 2023-12-10 ENCOUNTER — Inpatient Hospital Stay (HOSPITAL_COMMUNITY)
Admission: EM | Admit: 2023-12-10 | Discharge: 2023-12-15 | DRG: 907 | Disposition: A | Discharge status: Home / Self Care | Attending: Family Medicine | Admitting: Family Medicine

## 2023-12-10 ENCOUNTER — Encounter (HOSPITAL_COMMUNITY): Payer: Self-pay | Admitting: Internal Medicine

## 2023-12-10 ENCOUNTER — Other Ambulatory Visit: Payer: Self-pay | Admitting: Hematology and Oncology

## 2023-12-10 ENCOUNTER — Emergency Department (HOSPITAL_COMMUNITY)

## 2023-12-10 ENCOUNTER — Inpatient Hospital Stay: Attending: Hematology and Oncology

## 2023-12-10 ENCOUNTER — Inpatient Hospital Stay

## 2023-12-10 VITALS — BP 108/55 | HR 66 | Temp 97.8°F | Resp 16

## 2023-12-10 DIAGNOSIS — I82442 Acute embolism and thrombosis of left tibial vein: Secondary | ICD-10-CM | POA: Diagnosis not present

## 2023-12-10 DIAGNOSIS — D62 Acute posthemorrhagic anemia: Secondary | ICD-10-CM | POA: Diagnosis present

## 2023-12-10 DIAGNOSIS — I82452 Acute embolism and thrombosis of left peroneal vein: Secondary | ICD-10-CM | POA: Diagnosis not present

## 2023-12-10 DIAGNOSIS — D75839 Thrombocytosis, unspecified: Secondary | ICD-10-CM

## 2023-12-10 DIAGNOSIS — D473 Essential (hemorrhagic) thrombocythemia: Secondary | ICD-10-CM | POA: Insufficient documentation

## 2023-12-10 DIAGNOSIS — N179 Acute kidney failure, unspecified: Secondary | ICD-10-CM | POA: Diagnosis not present

## 2023-12-10 DIAGNOSIS — Z91041 Radiographic dye allergy status: Secondary | ICD-10-CM

## 2023-12-10 DIAGNOSIS — Z79899 Other long term (current) drug therapy: Secondary | ICD-10-CM | POA: Insufficient documentation

## 2023-12-10 DIAGNOSIS — R58 Hemorrhage, not elsewhere classified: Secondary | ICD-10-CM | POA: Diagnosis present

## 2023-12-10 DIAGNOSIS — I82412 Acute embolism and thrombosis of left femoral vein: Secondary | ICD-10-CM | POA: Diagnosis not present

## 2023-12-10 DIAGNOSIS — T148XXA Other injury of unspecified body region, initial encounter: Secondary | ICD-10-CM | POA: Diagnosis not present

## 2023-12-10 DIAGNOSIS — G894 Chronic pain syndrome: Secondary | ICD-10-CM | POA: Diagnosis present

## 2023-12-10 DIAGNOSIS — Z87891 Personal history of nicotine dependence: Secondary | ICD-10-CM | POA: Insufficient documentation

## 2023-12-10 DIAGNOSIS — K683 Retroperitoneal hematoma: Principal | ICD-10-CM | POA: Diagnosis present

## 2023-12-10 DIAGNOSIS — D649 Anemia, unspecified: Secondary | ICD-10-CM | POA: Insufficient documentation

## 2023-12-10 DIAGNOSIS — M792 Neuralgia and neuritis, unspecified: Secondary | ICD-10-CM | POA: Insufficient documentation

## 2023-12-10 DIAGNOSIS — F411 Generalized anxiety disorder: Secondary | ICD-10-CM | POA: Diagnosis present

## 2023-12-10 DIAGNOSIS — I723 Aneurysm of iliac artery: Secondary | ICD-10-CM | POA: Diagnosis not present

## 2023-12-10 DIAGNOSIS — K91871 Postprocedural hematoma of a digestive system organ or structure following other procedure: Principal | ICD-10-CM | POA: Diagnosis present

## 2023-12-10 DIAGNOSIS — I82402 Acute embolism and thrombosis of unspecified deep veins of left lower extremity: Secondary | ICD-10-CM | POA: Insufficient documentation

## 2023-12-10 DIAGNOSIS — Y848 Other medical procedures as the cause of abnormal reaction of the patient, or of later complication, without mention of misadventure at the time of the procedure: Secondary | ICD-10-CM | POA: Diagnosis present

## 2023-12-10 DIAGNOSIS — M7981 Nontraumatic hematoma of soft tissue: Secondary | ICD-10-CM | POA: Diagnosis present

## 2023-12-10 DIAGNOSIS — M96841 Postprocedural hematoma of a musculoskeletal structure following other procedure: Secondary | ICD-10-CM | POA: Diagnosis present

## 2023-12-10 DIAGNOSIS — I82422 Acute embolism and thrombosis of left iliac vein: Secondary | ICD-10-CM | POA: Diagnosis not present

## 2023-12-10 DIAGNOSIS — I10 Essential (primary) hypertension: Secondary | ICD-10-CM | POA: Diagnosis present

## 2023-12-10 DIAGNOSIS — K219 Gastro-esophageal reflux disease without esophagitis: Secondary | ICD-10-CM | POA: Diagnosis present

## 2023-12-10 LAB — CBC WITH DIFFERENTIAL/PLATELET
Abs Immature Granulocytes: 0.16 10*3/uL — ABNORMAL HIGH (ref 0.00–0.07)
Basophils Absolute: 0.1 10*3/uL (ref 0.0–0.1)
Basophils Relative: 0 %
Eosinophils Absolute: 0 10*3/uL (ref 0.0–0.5)
Eosinophils Relative: 0 %
HCT: 37.5 % (ref 36.0–46.0)
Hemoglobin: 12.2 g/dL (ref 12.0–15.0)
Immature Granulocytes: 1 %
Lymphocytes Relative: 9 %
Lymphs Abs: 1.4 10*3/uL (ref 0.7–4.0)
MCH: 31 pg (ref 26.0–34.0)
MCHC: 32.5 g/dL (ref 30.0–36.0)
MCV: 95.4 fL (ref 80.0–100.0)
Monocytes Absolute: 0.5 10*3/uL (ref 0.1–1.0)
Monocytes Relative: 3 %
Neutro Abs: 14.3 10*3/uL — ABNORMAL HIGH (ref 1.7–7.7)
Neutrophils Relative %: 87 %
Platelets: 685 10*3/uL — ABNORMAL HIGH (ref 150–400)
RBC: 3.93 MIL/uL (ref 3.87–5.11)
RDW: 14.6 % (ref 11.5–15.5)
WBC: 16.4 10*3/uL — ABNORMAL HIGH (ref 4.0–10.5)
nRBC: 0 % (ref 0.0–0.2)

## 2023-12-10 LAB — CBC WITH DIFFERENTIAL (CANCER CENTER ONLY)
Abs Immature Granulocytes: 0.03 10*3/uL (ref 0.00–0.07)
Basophils Absolute: 0.1 10*3/uL (ref 0.0–0.1)
Basophils Relative: 1 %
Eosinophils Absolute: 0.3 10*3/uL (ref 0.0–0.5)
Eosinophils Relative: 3 %
HCT: 36.8 % (ref 36.0–46.0)
Hemoglobin: 12.6 g/dL (ref 12.0–15.0)
Immature Granulocytes: 0 %
Lymphocytes Relative: 26 %
Lymphs Abs: 2.2 10*3/uL (ref 0.7–4.0)
MCH: 31 pg (ref 26.0–34.0)
MCHC: 34.2 g/dL (ref 30.0–36.0)
MCV: 90.6 fL (ref 80.0–100.0)
Monocytes Absolute: 0.7 10*3/uL (ref 0.1–1.0)
Monocytes Relative: 8 %
Neutro Abs: 5.4 10*3/uL (ref 1.7–7.7)
Neutrophils Relative %: 62 %
Platelet Count: 600 10*3/uL — ABNORMAL HIGH (ref 150–400)
RBC: 4.06 MIL/uL (ref 3.87–5.11)
RDW: 14.5 % (ref 11.5–15.5)
WBC Count: 8.7 10*3/uL (ref 4.0–10.5)
nRBC: 0 % (ref 0.0–0.2)

## 2023-12-10 LAB — COMPREHENSIVE METABOLIC PANEL WITH GFR
ALT: 17 U/L (ref 0–44)
AST: 21 U/L (ref 15–41)
Albumin: 4 g/dL (ref 3.5–5.0)
Alkaline Phosphatase: 65 U/L (ref 38–126)
Anion gap: 13 (ref 5–15)
BUN: 29 mg/dL — ABNORMAL HIGH (ref 8–23)
CO2: 23 mmol/L (ref 22–32)
Calcium: 8.9 mg/dL (ref 8.9–10.3)
Chloride: 99 mmol/L (ref 98–111)
Creatinine, Ser: 1.04 mg/dL — ABNORMAL HIGH (ref 0.44–1.00)
GFR, Estimated: 59 mL/min — ABNORMAL LOW (ref >60)
Glucose, Bld: 145 mg/dL — ABNORMAL HIGH (ref 70–99)
Potassium: 4.3 mmol/L (ref 3.5–5.1)
Sodium: 135 mmol/L (ref 135–145)
Total Bilirubin: 0.8 mg/dL (ref 0.0–1.2)
Total Protein: 7.2 g/dL (ref 6.5–8.1)

## 2023-12-10 LAB — URINALYSIS, ROUTINE W REFLEX MICROSCOPIC
Bilirubin Urine: NEGATIVE
Glucose, UA: NEGATIVE mg/dL
Hgb urine dipstick: NEGATIVE
Ketones, ur: NEGATIVE mg/dL
Nitrite: NEGATIVE
Protein, ur: 100 mg/dL — AB
Specific Gravity, Urine: 1.024 (ref 1.005–1.030)
pH: 5 (ref 5.0–8.0)

## 2023-12-10 LAB — LIPASE, BLOOD: Lipase: 41 U/L (ref 11–51)

## 2023-12-10 LAB — TYPE AND SCREEN
ABO/RH(D): A NEG
Antibody Screen: NEGATIVE

## 2023-12-10 MED ORDER — PANTOPRAZOLE SODIUM 40 MG PO TBEC
40.0000 mg | DELAYED_RELEASE_TABLET | Freq: Every day | ORAL | Status: DC
  Administered 2023-12-11 – 2023-12-15 (×5): 40 mg via ORAL
  Filled 2023-12-10 (×5): qty 1

## 2023-12-10 MED ORDER — SODIUM CHLORIDE 0.9% FLUSH: 3.0000 mL | INTRAVENOUS | Status: DC | PRN

## 2023-12-10 MED ORDER — HYDROCODONE-ACETAMINOPHEN 5-325 MG PO TABS
1.0000 | ORAL_TABLET | Freq: Four times a day (QID) | ORAL | Status: DC | PRN
  Administered 2023-12-11 (×2): 1 via ORAL
  Filled 2023-12-10 (×2): qty 1

## 2023-12-10 MED ORDER — SODIUM CHLORIDE 0.9 % IV SOLN
250.0000 mL | INTRAVENOUS | Status: AC | PRN
Start: 2023-12-10 — End: 2023-12-11

## 2023-12-10 MED ORDER — ONDANSETRON HCL 4 MG/2ML IJ SOLN
4.0000 mg | Freq: Four times a day (QID) | INTRAMUSCULAR | Status: DC | PRN
  Administered 2023-12-10: 4 mg via INTRAVENOUS
  Filled 2023-12-10 (×2): qty 2

## 2023-12-10 MED ORDER — ACETAMINOPHEN 650 MG RE SUPP: 650.0000 mg | Freq: Four times a day (QID) | RECTAL | Status: DC | PRN

## 2023-12-10 MED ORDER — SODIUM CHLORIDE 0.9% FLUSH
3.0000 mL | Freq: Two times a day (BID) | INTRAVENOUS | Status: DC
  Administered 2023-12-11 – 2023-12-15 (×9): 3 mL via INTRAVENOUS

## 2023-12-10 MED ORDER — FERROUS SULFATE 325 (65 FE) MG PO TABS
324.0000 mg | ORAL_TABLET | Freq: Every day | ORAL | Status: DC
  Administered 2023-12-11 – 2023-12-15 (×5): 324 mg via ORAL
  Filled 2023-12-10 (×5): qty 1

## 2023-12-10 MED ORDER — FENTANYL CITRATE PF 50 MCG/ML IJ SOSY
50.0000 ug | PREFILLED_SYRINGE | Freq: Once | INTRAMUSCULAR | Status: AC
  Administered 2023-12-10: 50 ug via INTRAVENOUS
  Filled 2023-12-10: qty 1

## 2023-12-10 MED ORDER — ACETAMINOPHEN 325 MG PO TABS
650.0000 mg | ORAL_TABLET | Freq: Four times a day (QID) | ORAL | Status: DC | PRN
Start: 2023-12-10 — End: 2023-12-15
  Administered 2023-12-13: 650 mg via ORAL
  Filled 2023-12-10 (×2): qty 2

## 2023-12-10 MED ORDER — ONDANSETRON HCL 4 MG/2ML IJ SOLN
4.0000 mg | Freq: Once | INTRAMUSCULAR | Status: AC
  Administered 2023-12-10: 4 mg via INTRAVENOUS
  Filled 2023-12-10: qty 2

## 2023-12-10 MED ORDER — IOHEXOL 300 MG/ML  SOLN
100.0000 mL | Freq: Once | INTRAMUSCULAR | Status: AC | PRN
  Administered 2023-12-10: 100 mL via INTRAVENOUS

## 2023-12-10 MED ORDER — HYDROMORPHONE HCL 1 MG/ML IJ SOLN
0.5000 mg | Freq: Once | INTRAMUSCULAR | Status: AC
  Administered 2023-12-10: 0.5 mg via INTRAVENOUS
  Filled 2023-12-10: qty 1

## 2023-12-10 MED ORDER — SENNOSIDES-DOCUSATE SODIUM 8.6-50 MG PO TABS
1.0000 | ORAL_TABLET | Freq: Two times a day (BID) | ORAL | Status: DC
  Administered 2023-12-11 – 2023-12-15 (×10): 1 via ORAL
  Filled 2023-12-10 (×10): qty 1

## 2023-12-10 MED ORDER — LACTATED RINGERS IV SOLN: INTRAVENOUS | Status: AC

## 2023-12-10 MED ORDER — LACTATED RINGERS IV BOLUS
1000.0000 mL | Freq: Once | INTRAVENOUS | Status: AC
  Administered 2023-12-10: 1000 mL via INTRAVENOUS

## 2023-12-10 MED ORDER — GABAPENTIN 300 MG PO CAPS
300.0000 mg | ORAL_CAPSULE | Freq: Three times a day (TID) | ORAL | Status: DC
  Administered 2023-12-11 – 2023-12-15 (×13): 300 mg via ORAL
  Filled 2023-12-10 (×13): qty 1

## 2023-12-10 MED ORDER — FENTANYL CITRATE PF 50 MCG/ML IJ SOSY
12.5000 ug | PREFILLED_SYRINGE | INTRAMUSCULAR | Status: DC | PRN
  Administered 2023-12-11: 25 ug via INTRAVENOUS
  Filled 2023-12-10: qty 1

## 2023-12-10 MED ORDER — FLUOXETINE HCL 20 MG PO CAPS
20.0000 mg | ORAL_CAPSULE | Freq: Every morning | ORAL | Status: DC
  Administered 2023-12-11 – 2023-12-15 (×5): 20 mg via ORAL
  Filled 2023-12-10 (×5): qty 1

## 2023-12-10 NOTE — ED Provider Notes (Signed)
 [place] EMERGENCY DEPARTMENT AT [facility] Provider Note   CSN: [id number] Arrival date & time: 12/10/23  8166     Patient presents with: Post-op Problem   [name] is a 68 y.o. female.   68 year old female with past medical history of hypertension and elevated platelets presenting to the emergency department today with back pain rating down her left leg as well as some weakness in her left leg after she had a bone marrow biopsy earlier today.  The patient was having the biopsy for evaluation for possible essential thrombocytosis.  She states that initially afterwards that she was able to ambulate but after getting home she is started develop pain as well as some weakness in the left leg.  She denies any bowel or bladder dysfunction or saddle anesthesia.  She came to the emergency department for further evaluation regarding this.  She states that she has had a few episodes of nonbloody, nonbilious emesis with this.  The patient reports that he did have to try multiple times for the biopsy and that blood was aspirated multiple times.        Prior to Admission medications   Medication Sig Start Date End Date Taking? Authorizing Provider  ALPRAZolam  (XANAX ) 0.25 MG tablet Take 0.25 mg by mouth as needed. anxiety    [provider]  ferrous sulfate  324 MG TBEC Take 324 mg by mouth.    [provider]  FLUoxetine  (PROZAC ) 20 MG capsule Take 20 mg by mouth every morning. 07/13/14   [provider]  gabapentin  (NEURONTIN ) 300 MG capsule Take 1 capsule by mouth three times daily as needed 10/27/23   Corey, Evan S, MD  HYDROcodone -acetaminophen  (NORCO/VICODIN) 5-325 MG per tablet  09/01/14   [provider]  KRILL OIL PO Take by mouth.    [provider]  lisinopril -hydrochlorothiazide  (PRINZIDE ,ZESTORETIC ) 10-12.5 MG per tablet Take 1 tablet by mouth every morning.    [provider]  pantoprazole  (PROTONIX ) 40 MG tablet  Take 40 mg by mouth daily.    [provider]  Probiotic Product (PROBIOTIC DAILY PO) Take by mouth.    [provider]    Allergies: Morphine and codeine and Povidone    Review of Systems  Musculoskeletal:  Positive for back pain.  Neurological:  Positive for weakness.  All other systems reviewed and are negative.   Updated Vital Signs BP 102/86 (BP Location: Left Arm)   Pulse 79   Temp 98 F (36.7 C) (Oral)   Resp 18   SpO2 95%   Physical Exam Vitals and nursing note reviewed.   Gen: NAD Eyes: PERRL, EOMI HEENT: no oropharyngeal swelling Neck: trachea midline Resp: clear to auscultation bilaterally Card: RRR, no murmurs, rubs, or gallops Abd: nontender, nondistended Extremities: no calf tenderness, no edema Vascular: 2+ radial pulses bilaterally, 2+ DP pulses bilaterally Neuro: The patient works normal sensation throughout the bilateral lower extremities, has normal strength throughout the right lower extremity, has 5 out of 5 strength with ankle flexion extension but 4 out of 5 strength with knee flexion and extension Skin: No overlying erythema over pulmonary biopsy site Psyc: acting appropriately   (all labs ordered are listed, but only abnormal results are displayed) Labs Reviewed  CBC WITH DIFFERENTIAL/PLATELET - Abnormal; Notable for the following components:      Result Value   WBC 16.4 (*)    Platelets 685 (*)    Neutro Abs 14.3 (*)    Abs Immature Granulocytes 0.16 (*)  All other components within normal limits  COMPREHENSIVE METABOLIC PANEL WITH GFR - Abnormal; Notable for the following components:   Glucose, Bld 145 (*)    BUN 29 (*)    Creatinine, Ser 1.04 (*)    GFR, Estimated 59 (*)    All other components within normal limits  URINALYSIS, ROUTINE W REFLEX MICROSCOPIC - Abnormal; Notable for the following components:   Color, Urine AMBER (*)    APPearance HAZY (*)    Protein, ur 100 (*)    Leukocytes,Ua TRACE (*)     Bacteria, UA RARE (*)    All other components within normal limits  LIPASE, BLOOD  TYPE AND SCREEN    EKG: None  Radiology: CT ABDOMEN PELVIS W CONTRAST Result Date: 12/10/2023 CLINICAL DATA:  Abdominal trauma, penetrating EXAM: CT ABDOMEN AND PELVIS WITH CONTRAST TECHNIQUE: Multidetector CT imaging of the abdomen and pelvis was performed using the standard protocol following bolus administration of intravenous contrast. RADIATION DOSE REDUCTION: This exam was performed according to the departmental dose-optimization program which includes automated exposure control, adjustment of the mA and/or kV according to patient size and/or use of iterative reconstruction technique. CONTRAST:  OMNIPAQUE IOHEXOL 300 MG/ML  SOLN COMPARISON:  CT lumbar spine 12/10/2023 FINDINGS: Lower chest: No acute abnormality.  Coronary artery calcification. Hepatobiliary: Not enlarged. No focal lesion. No laceration or subcapsular hematoma. The gallbladder is otherwise unremarkable with no radio-opaque gallstones. No biliary ductal dilatation. Pancreas: Normal pancreatic contour. No main pancreatic duct dilatation. Spleen: Not enlarged. No focal lesion. No laceration, subcapsular hematoma, or vascular injury. Adrenals/Urinary Tract: No nodularity bilaterally. Bilateral kidneys enhance symmetrically. No hydronephrosis. No contusion, laceration, or subcapsular hematoma. No injury to the vascular structures or collecting systems. No hydroureter. 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 Otherwise the urinary bladder is unremarkable. Stomach/Bowel: No small or large bowel wall thickening or dilatation. Colonic diverticulosis. The appendix is unremarkable. Vasculature/Lymphatics: No abdominal aorta or iliac aneurysm. No active contrast extravasation or pseudoaneurysm. No abdominal, pelvic, inguinal lymphadenopathy. Reproductive: Normal. Other: Stable left retroperitoneal and left psoas intramuscular  hematoma extending to the left pelvis, space of Retzius, and inguinal region. No definite associated active hemorrhage with limited evaluation due to timing of contrast. No simple free fluid ascites. No pneumoperitoneum. No organized fluid collection. Musculoskeletal: No significant soft tissue hematoma. No acute pelvic fracture. No spinal fracture. Grade 1 anterolisthesis of L5 on S1. Left sacral ala biopsy site noted (2:56). Other ports and devices: None. IMPRESSION: 1. Stable left retroperitoneal and left psoas intramuscular hematoma extending to the left pelvis, space of Retzius, and inguinal region. No definite evidence of associated active extravasation. 2. Other imaging findings of potential clinical significance: Colonic diverticulosis with no acute diverticulitis. Aortic Atherosclerosis (ICD10-I70.0). Electronically Signed   By: Morgane  Naveau M.D.   On: 12/10/2023 23:17   CT Lumbar Spine Wo Contrast Result Date: 12/10/2023 CLINICAL DATA:  Myelopathy, acute, lumbar spine Low back pain radiating down L leg, weakness L leg after bone marrow biopsy Pt bib family for from advice from cancer center pain after bone marrow biopsy. Lower back pain that radiates down groin the left leg. Nausea and vomiting as well. Issues with biopsy today had to attempt three times. EXAM: CT LUMBAR SPINE WITHOUT CONTRAST TECHNIQUE: Multidetector CT imaging of the lumbar spine was performed without intravenous contrast administration. Multiplanar CT image reconstructions were also generated. RADIATION DOSE REDUCTION: This exam was performed according to the departmental dose-optimization program which includes automated exposure  control, adjustment of the mA and/or kV according to patient size and/or use of iterative reconstruction technique. COMPARISON:  None Available. FINDINGS: Segmentation: 5 lumbar type vertebrae. Alignment: Grade 1 anterolisthesis of L5 on S1. Vertebrae: Multilevel mild degenerative changes of the spine. No  acute fracture or focal pathologic process. Paraspinal and other soft tissues: Negative. Disc levels: Intervertebral disc space maintained. Other: Atherosclerotic plaque. Asymmetrically enlarged left psoas muscle with associated retroperitoneal hematoma and fat stranding extending down to the pelvis and presacral space with as well as left pelvic sidewall. Partially visualized 6.2 x 3.8 cm left pelvic sidewall hematoma with markedly limited evaluation on this noncontrast study. Left sacral ala linear lucencies consistent with recent biopsy. IMPRESSION: 1. Left psoas muscle hematoma with associated at least moderate volume retroperitoneal and pelvic hematoma. Partially visualized 6.2 x 3.8 cm left pelvic sidewall hematoma with markedly limited evaluation on this noncontrast study. Recommend further evaluation with a trauma scan with intravenous contrast of the abdomen and pelvis including delayed imaging of the abdomen and pelvis (please make note in comments for CT tech to obtain delayed images of both the abdomen and pelvis). Finding consistent with traumatic injury in the setting of recent biopsy. 2. Left sacral ala linear lucencies consistent with recent biopsy. 3. No acute displaced fracture or traumatic listhesis of the lumbar spine. 4.  Aortic Atherosclerosis (ICD10-I70.0). Electronically Signed   By: Morgane  Naveau M.D.   On: 12/10/2023 21:37     Procedures   Medications Ordered in the ED  ondansetron  (ZOFRAN ) injection 4 mg (4 mg Intravenous Given 12/10/23 2118)  fentaNYL  (SUBLIMAZE ) injection 50 mcg (50 mcg Intravenous Given 12/10/23 1932)  ondansetron  (ZOFRAN ) injection 4 mg (4 mg Intravenous Given 12/10/23 1932)  lactated ringers  bolus 1,000 mL (1,000 mLs Intravenous New Bag/Given 12/10/23 1932)  HYDROmorphone  (DILAUDID ) injection 0.5 mg (0.5 mg Intravenous Given 12/10/23 2118)  iohexol (OMNIPAQUE) 300 MG/ML solution 100 mL (100 mLs Intravenous Contrast Given 12/10/23 2235)                                     Medical Decision Making 68 year old female with past medical history of hypertension and elevated platelets presenting to the emergency department today with neck pain as well as some weakness in her left lower extremity after bone marrow biopsy today.  Will further evaluate her here with labs and give the patient fentanyl  and Zofran  for symptoms.  Will call discussed with oncology to determine imaging of choice.  We do not have MRI at this location currently so if she will need MRI will need to be sent to [facility].  I discussed the patient's case with Dr. Gatha.  Recommends treating the patient's pain and if she is having persistent symptoms to start with CT scan for initial imaging.  He suspects that the symptoms are from the local anesthesia that the patient received earlier today.  The patient sees the scan did show a retroperitoneal hematoma.  CT scan with contrast is requested from radiology.  This is ordered.  The patient had an iodine allergy listed and this was apparently 2 topical treatment close to 50 years ago.  The patient has had CT scans with iodinated contrast media and has never had any issues.  I think that she is safe to go forward with this.  The patient CT scan does show the retroperitoneal hematoma with no active extravasation.  I did call and  discussed this with Dr. Gatha.  Recommends observation admission and consultation for her oncologist who performed the procedure tomorrow.  The patient is feeling better after medications here.  She is otherwise hemodynamically stable.  A call was placed to hospitalist service for admission.  Amount and/or Complexity of Data Reviewed Labs: ordered. Radiology: ordered.  Risk Prescription drug management. Decision regarding hospitalization.        Final diagnoses:  Retroperitoneal hematoma    ED Discharge Orders     None          Ula Prentice SAUNDERS, MD 12/10/23 [phone]

## 2023-12-10 NOTE — Patient Instructions (Signed)
 Bone Marrow Aspiration and Bone Marrow Biopsy, Adult, Care After The following information offers guidance on how to care for yourself after your procedure. Your health care provider may also give you more specific instructions. If you have problems or questions, contact your health care provider. What can I expect after the procedure? After the procedure, it is common to have: Mild pain and tenderness. Swelling. Bruising. Follow these instructions at home: Incision care  Follow instructions from your health care provider about how to take care of the incision site. Make sure you: Wash your hands with soap and water for at least 20 seconds before and after you change your bandage (dressing). If soap and water are not available, use hand sanitizer. Change your dressing as told by your health care provider. Leave stitches (sutures), skin glue, or adhesive strips in place. These skin closures may need to stay in place for 2 weeks or longer. If adhesive strip edges start to loosen and curl up, you may trim the loose edges. Do not remove adhesive strips completely unless your health care provider tells you to do that. Check your incision site every day for signs of infection. Check for: More redness, swelling, or pain. Fluid or blood. Warmth. Pus or a bad smell. Activity Return to your normal activities as told by your health care provider. Ask your health care provider what activities are safe for you. Do not lift anything that is heavier than 10 lb (4.5 kg), or the limit that you are told, until your health care provider says that it is safe. If you were given a sedative during the procedure, it can affect you for several hours. Do not drive or operate machinery until your health care provider says that it is safe. General instructions  Take over-the-counter and prescription medicines only as told by your health care provider. Do not take baths, swim, or use a hot tub until your health care  provider approves. Ask your health care provider if you may take showers. You may only be allowed to take sponge baths. If directed, put ice on the affected area. To do this: Put ice in a plastic bag. Place a towel between your skin and the bag. Leave the ice on for 20 minutes, 2-3 times a day. If your skin turns bright red, remove the ice right away to prevent skin damage. The risk of skin damage is higher if you cannot feel pain, heat, or cold. Contact a health care provider if: You have signs of infection. Your pain is not controlled with medicine. You have cancer, and a temperature of 100.89F (38C) or higher. Get help right away if: You have a temperature of 101F (38.3C) or higher, or as told by your health care provider. You have bleeding from the incision site that cannot be controlled. This information is not intended to replace advice given to you by your health care provider. Make sure you discuss any questions you have with your health care provider. Document Revised: 10/01/2021 Document Reviewed: 10/01/2021 Elsevier Patient Education  2024 [employer].

## 2023-12-10 NOTE — Progress Notes (Signed)
 INDICATION: Thrombocytosis   Bone Marrow Biopsy and Aspiration Procedure Note   Informed consent was obtained and potential risks including bleeding, infection and pain were reviewed with the patient.  The patient's name, date of birth, identification, consent and allergies were verified prior to the start of procedure and time out was performed.  The left posterior iliac crest was chosen as the site of biopsy.  The skin was prepped with ChloraPrep.   16 cc of 1% lidocaine was used to provide local anaesthesia.  When the aspirate was attempted, there was profound amount of blood aspirate return through the needle which was felt to be very unusual.  This was attempted second time trying to get a biopsy and the same thing happened again.  Therefore the procedure was aborted.  We sent the aspirate for pathology review.  Biopsy could not be performed because of this dramatic blood return through the bone marrow needle.   Pressure was applied to the biopsy site and bandage was placed over the biopsy site. Patient was made to lie on the back for 15 mins prior to discharge.  The procedure was tolerated well. COMPLICATIONS: None BLOOD LOSS: none The patient was discharged home in stable condition with a 1 week follow up to review results.  Patient was provided with post bone marrow biopsy instructions and instructed to call if there was any bleeding or worsening pain.   Signed Viinay K Alixandra Alfieri, MD

## 2023-12-10 NOTE — Progress Notes (Signed)
 Patient was here for Bone Marrow Biopsy with aspiration. Procedure went well. Patient was observed for 30 minutes post procedure. VSS dressing was dry with no blood. Education was given to patient and husband at bedside. Voiced understanding. Ambulatory to lobby.

## 2023-12-10 NOTE — H&P (Incomplete)
 History and Physical    [name] FMW:[id number] DOB: Jun 25, 1955 DOA: 12/10/2023  PCP: Merilee, L.Addie, MD (Inactive)   Patient coming from: Home   Chief Complaint:  Chief Complaint  Patient presents with   Post-op Problem   ED TRIAGE note:  Pt bib family for from advice from cancer center pain after bone marrow biopsy. Lower back pain that radiates down groin the left leg.  Nausea and vomiting as well.  Issues with biopsy today had to attempt three times.         HPI:  [name] is a 68 y.o. female with medical history significant of neuropathic pain, arthritis, generalized anxiety disorder, essential hypertension, GERD, and chronic pain syndrome presented to emergency complaining of back pain radiating down to the left leg as well as some weakness of the left leg after she had a bone marrow biopsy today. Patient had biopsy for evaluation for essential thrombocytosis.  Patient reported that initially after the biopsy she was able to ambulate but after getting home she started developing some pain and weakness of the left leg.  Denies any bowel or bladder dysfunction.  Denies any saddle anesthesia.  Patient reported that she has episodes of nonbloody nonbilious vomiting at home. Patient underwent left posterior iliac crest biopsy today 7/2. Patient reported left-sided lower abdominal cramping pain with associated left-sided back pain.  Nauseated.  Denies any also vomiting.  No other complaint at this time.  ED Course:  At presentation to ED patient is hemodynamically stable. CBC leukocytosis 16.4 and elevated platelet count 685. CMP showing elevated creatinine 1.04 otherwise unremarkable.  Normal lipase level. UA showing evidence of UTI.  CT abdomen pelvis showed: 1. Left psoas muscle hematoma with associated at least moderate volume retroperitoneal and pelvic hematoma. Partially visualized 6.2 x 3.8 cm left pelvic sidewall hematoma with markedly limited evaluation  on this noncontrast study. Recommend further evaluation with a trauma scan with intravenous contrast of the abdomen and pelvis including delayed imaging of the abdomen and pelvis (please make note in comments for CT tech to obtain delayed images of both the abdomen and pelvis). Finding consistent with traumatic injury in the setting of recent biopsy. 2. Left sacral ala linear lucencies consistent with recent biopsy. 3. No acute displaced fracture or traumatic listhesis of the lumbar spine. 4.  Aortic Atherosclerosis (ICD10-I70.0).   CT abdomen pelvis showing . Stable left retroperitoneal and left psoas intramuscular hematoma extending to the left pelvis, space of Retzius, and inguinal region. No definite evidence of associated active extravasation. 2. Other imaging findings of potential clinical significance: Colonic diverticulosis with no acute diverticulitis. Aortic Atherosclerosis (ICD10-I70.0).  In the ED patient has been given Zofran , 1 L of LR bolus Dilaudid  and fentanyl .   ED physician consulted and discussed case with on-call hematology Dr. Sherrod who recommended admit patient for observation overnight, check H&H frequently to make sure patient's hemoglobin is stable.  The physician also curbside consulted and discussed with general surgery given there is no extravasation of bleeding no intervention needed as well.  Per oncology if patient develops hemodynamic instability in that case need to inform hematology again for further evaluation.  Hospitalist has been consulted for further evaluation management of retroperitoneal and left psoas hematoma and AKI.   Significant labs in the ED: Lab Orders         CBC with Differential         Comprehensive metabolic panel         Lipase, blood  Urinalysis, Routine w reflex microscopic -Urine, Clean Catch         CBC         Basic metabolic panel         Hemoglobin and hematocrit, blood         HIV Antibody (routine testing  w rflx)       Review of Systems:  Review of Systems  Constitutional:  Negative for chills, fever, malaise/fatigue and weight loss.  Respiratory:  Negative for cough.   Cardiovascular:  Negative for chest pain and palpitations.  Gastrointestinal:  Negative for abdominal pain, diarrhea, heartburn, nausea and vomiting.  Genitourinary:  Negative for dysuria, frequency and urgency.  Musculoskeletal:  Positive for back pain. Negative for falls, joint pain, myalgias and neck pain.  Neurological:  Negative for dizziness.  Psychiatric/Behavioral:  The patient is not nervous/anxious.     Past Medical History:  Diagnosis Date   Anxiety    Arthritis    Atrophic vaginitis    Depression    GERD (gastroesophageal reflux disease)    GERD (gastroesophageal reflux disease)    Hypertension    Osteoarthritis    Seasonal allergies    Sleep apnea     Past Surgical History:  Procedure Laterality Date    fistula repair x 3  10 yrs ago   ABDOMINAL HYSTERECTOMY  yrs ago   COLONOSCOPY     JOINT REPLACEMENT  2012   right   right knee arthroscopic surgery  yrs ago   right rotator cuff repair  8 yrs    TOTAL KNEE ARTHROPLASTY  04/14/2012   Procedure: TOTAL KNEE ARTHROPLASTY;  Surgeon: Donnice JONETTA Car, MD;  Location: [facility];  Service: Orthopedics;  Laterality: Left;     reports that she quit smoking about 17 years ago. Her smoking use included cigarettes. She started smoking about 37 years ago. She has a 5 pack-year smoking history. She has never used smokeless tobacco. She reports current alcohol use of about 2.0 standard drinks of alcohol per week. She reports current drug use. Drug: Marijuana.  Allergies  Allergen Reactions   Morphine And Codeine Itching   Povidone Rash    Patient states she had mild rash to povidone about 50 years ago after child birth.  No other known allergic reaction since.     Family History  Problem Relation Age of Onset   Diabetes Maternal Grandfather     Prior to  Admission medications   Medication Sig Start Date End Date Taking? Authorizing Provider  ALPRAZolam  (XANAX ) 0.25 MG tablet Take 0.25 mg by mouth as needed. anxiety    [provider]  ferrous sulfate  324 MG TBEC Take 324 mg by mouth.    [provider]  FLUoxetine  (PROZAC ) 20 MG capsule Take 20 mg by mouth every morning. 07/13/14   [provider]  gabapentin  (NEURONTIN ) 300 MG capsule Take 1 capsule by mouth three times daily as needed 10/27/23   Corey, Evan S, MD  HYDROcodone -acetaminophen  (NORCO/VICODIN) 5-325 MG per tablet  09/01/14   [provider]  KRILL OIL PO Take by mouth.    [provider]  lisinopril -hydrochlorothiazide  (PRINZIDE ,ZESTORETIC ) 10-12.5 MG per tablet Take 1 tablet by mouth every morning.    [provider]  pantoprazole  (PROTONIX ) 40 MG tablet Take 40 mg by mouth daily.    [provider]  Probiotic Product (PROBIOTIC DAILY PO) Take by mouth.    [provider]     Physical Exam: Vitals:   12/10/23  1844 12/10/23 1846 12/10/23 2145 12/10/23 2156  BP:  (!) 142/119 (!) 128/101 102/86  Pulse: 76 66 80 79  Resp: 18   18  Temp: (!) 97.1 F (36.2 C)   98 F (36.7 C)  TempSrc: Axillary   Oral  SpO2: 97% 100% 92% 95%    Physical Exam Constitutional:      Appearance: Normal appearance. She is not ill-appearing.  HENT:     Nose: Nose normal.     Mouth/Throat:     Mouth: Mucous membranes are dry.  Cardiovascular:     Rate and Rhythm: Normal rate and regular rhythm.     Pulses: Normal pulses.     Heart sounds: Normal heart sounds.  Pulmonary:     Effort: Pulmonary effort is normal.     Breath sounds: Normal breath sounds.  Abdominal:     General: There is no distension.     Palpations: There is no mass.     Tenderness: There is no abdominal tenderness.  Musculoskeletal:     Cervical back: Neck supple.  Skin:    Capillary Refill: Capillary refill takes less than 2 seconds.  Neurological:      Mental Status: She is alert and oriented to person, place, and time.  Psychiatric:        Mood and Affect: Mood normal.      Labs on Admission: I have personally reviewed following labs and imaging studies  CBC: Recent Labs  Lab 12/10/23 0834 12/10/23 1946  WBC 8.7 16.4*  NEUTROABS 5.4 14.3*  HGB 12.6 12.2  HCT 36.8 37.5  MCV 90.6 95.4  PLT 600* 685*   Basic Metabolic Panel: Recent Labs  Lab 12/10/23 1946  NA 135  K 4.3  CL 99  CO2 23  GLUCOSE 145*  BUN 29*  CREATININE 1.04*  CALCIUM 8.9   GFR: CrCl cannot be calculated (Unknown ideal weight.). Liver Function Tests: Recent Labs  Lab 12/10/23 1946  AST 21  ALT 17  ALKPHOS 65  BILITOT 0.8  PROT 7.2  ALBUMIN 4.0   Recent Labs  Lab 12/10/23 1946  LIPASE 41   No results for input(s): AMMONIA in the last 168 hours. Coagulation Profile: No results for input(s): INR, PROTIME in the last 168 hours. Cardiac Enzymes: No results for input(s): CKTOTAL, CKMB, CKMBINDEX, TROPONINI, TROPONINIHS in the last 168 hours. BNP (last 3 results) No results for input(s): BNP in the last 8760 hours. HbA1C: No results for input(s): HGBA1C in the last 72 hours. CBG: No results for input(s): GLUCAP in the last 168 hours. Lipid Profile: No results for input(s): CHOL, HDL, LDLCALC, TRIG, CHOLHDL, LDLDIRECT in the last 72 hours. Thyroid Function Tests: No results for input(s): TSH, T4TOTAL, FREET4, T3FREE, THYROIDAB in the last 72 hours. Anemia Panel: No results for input(s): VITAMINB12, FOLATE, FERRITIN, TIBC, IRON, RETICCTPCT in the last 72 hours. Urine analysis:    Component Value Date/Time   COLORURINE AMBER (A) 12/10/2023 1951   APPEARANCEUR HAZY (A) 12/10/2023 1951   LABSPEC 1.024 12/10/2023 1951   PHURINE 5.0 12/10/2023 1951   GLUCOSEU NEGATIVE 12/10/2023 1951   HGBUR NEGATIVE 12/10/2023 1951   BILIRUBINUR NEGATIVE 12/10/2023 1951   KETONESUR NEGATIVE  12/10/2023 1951   PROTEINUR 100 (A) 12/10/2023 1951   UROBILINOGEN 0.2 04/09/2012 0859   NITRITE NEGATIVE 12/10/2023 1951   LEUKOCYTESUR TRACE (A) 12/10/2023 1951    Radiological Exams on Admission: I have personally reviewed images CT ABDOMEN PELVIS W CONTRAST Result Date: 12/10/2023 CLINICAL DATA:  Abdominal trauma, penetrating EXAM: CT ABDOMEN AND PELVIS WITH CONTRAST TECHNIQUE: Multidetector CT imaging of the abdomen and pelvis was performed using the standard protocol following bolus administration of intravenous contrast. RADIATION DOSE REDUCTION: This exam was performed according to the departmental dose-optimization program which includes automated exposure control, adjustment of the mA and/or kV according to patient size and/or use of iterative reconstruction technique. CONTRAST:  OMNIPAQUE IOHEXOL 300 MG/ML  SOLN COMPARISON:  CT lumbar spine 12/10/2023 FINDINGS: Lower chest: No acute abnormality.  Coronary artery calcification. Hepatobiliary: Not enlarged. No focal lesion. No laceration or subcapsular hematoma. The gallbladder is otherwise unremarkable with no radio-opaque gallstones. No biliary ductal dilatation. Pancreas: Normal pancreatic contour. No main pancreatic duct dilatation. Spleen: Not enlarged. No focal lesion. No laceration, subcapsular hematoma, or vascular injury. Adrenals/Urinary Tract: No nodularity bilaterally. Bilateral kidneys enhance symmetrically. No hydronephrosis. No contusion, laceration, or subcapsular hematoma. No injury to the vascular structures or collecting systems. No hydroureter. 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 Otherwise the urinary bladder is unremarkable. Stomach/Bowel: No small or large bowel wall thickening or dilatation. Colonic diverticulosis. The appendix is unremarkable. Vasculature/Lymphatics: No abdominal aorta or iliac aneurysm. No active contrast extravasation or pseudoaneurysm. No abdominal, pelvic,  inguinal lymphadenopathy. Reproductive: Normal. Other: Stable left retroperitoneal and left psoas intramuscular hematoma extending to the left pelvis, space of Retzius, and inguinal region. No definite associated active hemorrhage with limited evaluation due to timing of contrast. No simple free fluid ascites. No pneumoperitoneum. No organized fluid collection. Musculoskeletal: No significant soft tissue hematoma. No acute pelvic fracture. No spinal fracture. Grade 1 anterolisthesis of L5 on S1. Left sacral ala biopsy site noted (2:56). Other ports and devices: None. IMPRESSION: 1. Stable left retroperitoneal and left psoas intramuscular hematoma extending to the left pelvis, space of Retzius, and inguinal region. No definite evidence of associated active extravasation. 2. Other imaging findings of potential clinical significance: Colonic diverticulosis with no acute diverticulitis. Aortic Atherosclerosis (ICD10-I70.0). Electronically Signed   By: Morgane  Naveau M.D.   On: 12/10/2023 23:17   CT Lumbar Spine Wo Contrast Result Date: 12/10/2023 CLINICAL DATA:  Myelopathy, acute, lumbar spine Low back pain radiating down L leg, weakness L leg after bone marrow biopsy Pt bib family for from advice from cancer center pain after bone marrow biopsy. Lower back pain that radiates down groin the left leg. Nausea and vomiting as well. Issues with biopsy today had to attempt three times. EXAM: CT LUMBAR SPINE WITHOUT CONTRAST TECHNIQUE: Multidetector CT imaging of the lumbar spine was performed without intravenous contrast administration. Multiplanar CT image reconstructions were also generated. RADIATION DOSE REDUCTION: This exam was performed according to the departmental dose-optimization program which includes automated exposure control, adjustment of the mA and/or kV according to patient size and/or use of iterative reconstruction technique. COMPARISON:  None Available. FINDINGS: Segmentation: 5 lumbar type vertebrae.  Alignment: Grade 1 anterolisthesis of L5 on S1. Vertebrae: Multilevel mild degenerative changes of the spine. No acute fracture or focal pathologic process. Paraspinal and other soft tissues: Negative. Disc levels: Intervertebral disc space maintained. Other: Atherosclerotic plaque. Asymmetrically enlarged left psoas muscle with associated retroperitoneal hematoma and fat stranding extending down to the pelvis and presacral space with as well as left pelvic sidewall. Partially visualized 6.2 x 3.8 cm left pelvic sidewall hematoma with markedly limited evaluation on this noncontrast study. Left sacral ala linear lucencies consistent with recent biopsy. IMPRESSION: 1. Left psoas muscle hematoma with associated at least moderate volume retroperitoneal and  pelvic hematoma. Partially visualized 6.2 x 3.8 cm left pelvic sidewall hematoma with markedly limited evaluation on this noncontrast study. Recommend further evaluation with a trauma scan with intravenous contrast of the abdomen and pelvis including delayed imaging of the abdomen and pelvis (please make note in comments for CT tech to obtain delayed images of both the abdomen and pelvis). Finding consistent with traumatic injury in the setting of recent biopsy. 2. Left sacral ala linear lucencies consistent with recent biopsy. 3. No acute displaced fracture or traumatic listhesis of the lumbar spine. 4.  Aortic Atherosclerosis (ICD10-I70.0). Electronically Signed   By: Morgane  Naveau M.D.   On: 12/10/2023 21:37     Assessment/Plan: Principal Problem:   Intramuscular hematoma Active Problems:   Retroperitoneal hematoma   AKI (acute kidney injury) (HCC)   Thrombocytosis   Generalized anxiety disorder   Neuropathic pain   Essential hypertension   GERD (gastroesophageal reflux disease)   Chronic pain syndrome    Assessment and Plan: Left psoas intramuscular hematoma Retroperitoneal hematoma -Present emergency department with complaining of  left-sided back pain which radiates to left-sided lower leg.  Patient underwent bone marrow biopsy today.  Per chart review patient has history of thrombocytosis and underwent left posterior iliac crest biopsy today.  Patient reported that since the biopsy she was able to ambulate however started developing pain which which is interfering with with movement and feeling nauseated as well. -At presentation to ED patient is hemodynamically stable - CBC showing leukocytosis 16.4, stable H&H 12.2 and 37.5 and elevated platelet count 685.  CMP showing evidence of AKI elevated creatinine 1.04. -CT lumbar spine and CT abdomen pelvis showed left psoas muscle hematoma and stable left retroperitoneal hematoma extending to the left pelvis,space of Retzius, and inguinal region.  There is no active extravasation. - Given patient is hemodynamically stable, stable H&H and there is no active extravasation no need for surgical intervention at this time.  ED physician also discussed with oncology Dr. Gatha who stated that continue to monitor patient's hemodynamic status and H&H.  If patient becomes hemodynamically stable in that case need to reach out to oncology again for further recommendation.  Otherwise heme-onc will see patient tomorrow. - Continue to monitor H&H and transfuse as needed goal to keep hemoglobin above 8. - If patient develops rapid hemodynamic instability we will repeat CT abdomen pelvis stat to look for any active extravasation/bleeding. -Continue pain control. -Continue maintenance fluid LR 125 cc/h. - Continue fall precaution and ambulate with assistance.   Reactive leukocytosis Thrombocytosis - Elevated WBC count 16.4 and elevated platelet count 685.SABRA  Reactive leukocytosis in the setting of acute illness.  Patient is afebrile.   -Patient is currently undergoing outpatient workup for thrombocytosis.  In the setting of intravascular and retroperitoneal hematoma will defer any antiplatelet  therapy.   Acute kidney injury -Elevated creatinine 1.04.  Acute kidney injury in the setting of borderline hypotension, nausea, poor oral intake and side effect of lisinopril -hydrochlorothiazide .  Holding blood pressure regimen. - In the ED patient received 1 L of LR bolus.  Continue maintenance fluid LR 125 cc/h.  Avoid nephrotoxic agent. Showed evidence of UTI however patient is asymptomatic.  Neuropathic pain Chronic pain syndrome - Continue home Norco and gabapentin .  Essential hypertension Holding home blood pressure regimen in the setting of hypotension and AKI  Generalized anxiety disorder -Continue Prozac .   DVT prophylaxis:  SCDs.  Avoid pharmacological DVT prophylaxis in the setting of  retroperitoneal hematoma and intramuscular hematoma. Code Status:  Full Code Diet: Heart healthy diet Family Communication:   Family was present at bedside, at the time of interview. Opportunity was given to ask question and all questions were answered satisfactorily.  Disposition Plan: Continue to monitor H&H and for any hemodynamic instability. Consults: Oncology Admission status:   Observation, Telemetry bed  Severity of Illness: The appropriate patient status for this patient is OBSERVATION. Observation status is judged to be reasonable and necessary in order to provide the required intensity of service to ensure the patient's safety. The patient's presenting symptoms, physical exam findings, and initial radiographic and laboratory data in the context of their medical condition is felt to place them at decreased risk for further clinical deterioration. Furthermore, it is anticipated that the patient will be medically stable for discharge from the hospital within 2 midnights of admission.     Katlin Ciszewski, MD [facility]  How to contact the [facility] Attending or Consulting provider 7A - 7P or covering provider during after hours 7P -7A, for this patient.  Check the care team in  [facility] and look for a) attending/consulting [facility] provider listed and b) the [facility] team listed Log into [email or web address] and use [employer] universal password to access. If you do not have the password, please contact the hospital operator. Locate the [facility] provider you are looking for under [facility] and page to a number that you can be directly reached. If you still have difficulty reaching the provider, please page the [facility] (Director on Call) for the Hospitalists listed on amion for assistance.  12/11/2023, 12:13 AM

## 2023-12-10 NOTE — ED Triage Notes (Signed)
 Pt bib family for from advice from cancer center pain after bone marrow biopsy. Lower back pain that radiates down groin the left leg.  Nausea and vomiting as well.  Issues with biopsy today had to attempt three times.

## 2023-12-11 ENCOUNTER — Encounter (HOSPITAL_COMMUNITY): Payer: Self-pay | Admitting: Internal Medicine

## 2023-12-11 ENCOUNTER — Other Ambulatory Visit: Payer: Self-pay

## 2023-12-11 DIAGNOSIS — M7981 Nontraumatic hematoma of soft tissue: Secondary | ICD-10-CM | POA: Diagnosis present

## 2023-12-11 DIAGNOSIS — N179 Acute kidney failure, unspecified: Secondary | ICD-10-CM | POA: Diagnosis present

## 2023-12-11 DIAGNOSIS — I82442 Acute embolism and thrombosis of left tibial vein: Secondary | ICD-10-CM | POA: Diagnosis not present

## 2023-12-11 DIAGNOSIS — G894 Chronic pain syndrome: Secondary | ICD-10-CM | POA: Diagnosis present

## 2023-12-11 DIAGNOSIS — Y848 Other medical procedures as the cause of abnormal reaction of the patient, or of later complication, without mention of misadventure at the time of the procedure: Secondary | ICD-10-CM | POA: Diagnosis present

## 2023-12-11 DIAGNOSIS — I82452 Acute embolism and thrombosis of left peroneal vein: Secondary | ICD-10-CM | POA: Diagnosis not present

## 2023-12-11 DIAGNOSIS — R58 Hemorrhage, not elsewhere classified: Secondary | ICD-10-CM | POA: Diagnosis present

## 2023-12-11 DIAGNOSIS — I10 Essential (primary) hypertension: Secondary | ICD-10-CM | POA: Diagnosis present

## 2023-12-11 DIAGNOSIS — T148XXA Other injury of unspecified body region, initial encounter: Secondary | ICD-10-CM | POA: Diagnosis not present

## 2023-12-11 DIAGNOSIS — I82412 Acute embolism and thrombosis of left femoral vein: Secondary | ICD-10-CM | POA: Diagnosis not present

## 2023-12-11 DIAGNOSIS — K91871 Postprocedural hematoma of a digestive system organ or structure following other procedure: Secondary | ICD-10-CM | POA: Diagnosis present

## 2023-12-11 DIAGNOSIS — I82422 Acute embolism and thrombosis of left iliac vein: Secondary | ICD-10-CM | POA: Diagnosis not present

## 2023-12-11 DIAGNOSIS — D62 Acute posthemorrhagic anemia: Secondary | ICD-10-CM | POA: Diagnosis present

## 2023-12-11 DIAGNOSIS — F411 Generalized anxiety disorder: Secondary | ICD-10-CM | POA: Diagnosis present

## 2023-12-11 DIAGNOSIS — K683 Retroperitoneal hematoma: Secondary | ICD-10-CM | POA: Diagnosis present

## 2023-12-11 DIAGNOSIS — Z91041 Radiographic dye allergy status: Secondary | ICD-10-CM | POA: Diagnosis not present

## 2023-12-11 DIAGNOSIS — I723 Aneurysm of iliac artery: Secondary | ICD-10-CM | POA: Diagnosis not present

## 2023-12-11 DIAGNOSIS — Z87891 Personal history of nicotine dependence: Secondary | ICD-10-CM | POA: Diagnosis not present

## 2023-12-11 DIAGNOSIS — M96841 Postprocedural hematoma of a musculoskeletal structure following other procedure: Secondary | ICD-10-CM | POA: Diagnosis present

## 2023-12-11 DIAGNOSIS — K219 Gastro-esophageal reflux disease without esophagitis: Secondary | ICD-10-CM | POA: Diagnosis present

## 2023-12-11 DIAGNOSIS — R609 Edema, unspecified: Secondary | ICD-10-CM | POA: Diagnosis not present

## 2023-12-11 DIAGNOSIS — D75839 Thrombocytosis, unspecified: Secondary | ICD-10-CM | POA: Diagnosis present

## 2023-12-11 LAB — CBC
HCT: 31.8 % — ABNORMAL LOW (ref 36.0–46.0)
Hemoglobin: 10.5 g/dL — ABNORMAL LOW (ref 12.0–15.0)
MCH: 31.8 pg (ref 26.0–34.0)
MCHC: 33 g/dL (ref 30.0–36.0)
MCV: 96.4 fL (ref 80.0–100.0)
Platelets: 616 10*3/uL — ABNORMAL HIGH (ref 150–400)
RBC: 3.3 MIL/uL — ABNORMAL LOW (ref 3.87–5.11)
RDW: 14.6 % (ref 11.5–15.5)
WBC: 14.9 10*3/uL — ABNORMAL HIGH (ref 4.0–10.5)
nRBC: 0 % (ref 0.0–0.2)

## 2023-12-11 LAB — CBC WITH DIFFERENTIAL/PLATELET
Abs Immature Granulocytes: 0.06 10*3/uL (ref 0.00–0.07)
Basophils Absolute: 0.1 10*3/uL (ref 0.0–0.1)
Basophils Relative: 1 %
Eosinophils Absolute: 0.2 10*3/uL (ref 0.0–0.5)
Eosinophils Relative: 2 %
HCT: 32.5 % — ABNORMAL LOW (ref 36.0–46.0)
Hemoglobin: 10.6 g/dL — ABNORMAL LOW (ref 12.0–15.0)
Immature Granulocytes: 1 %
Lymphocytes Relative: 23 %
Lymphs Abs: 3 10*3/uL (ref 0.7–4.0)
MCH: 31 pg (ref 26.0–34.0)
MCHC: 32.6 g/dL (ref 30.0–36.0)
MCV: 95 fL (ref 80.0–100.0)
Monocytes Absolute: 1 10*3/uL (ref 0.1–1.0)
Monocytes Relative: 7 %
Neutro Abs: 9 10*3/uL — ABNORMAL HIGH (ref 1.7–7.7)
Neutrophils Relative %: 66 %
Platelets: 643 10*3/uL — ABNORMAL HIGH (ref 150–400)
RBC: 3.42 MIL/uL — ABNORMAL LOW (ref 3.87–5.11)
RDW: 14.8 % (ref 11.5–15.5)
WBC: 13.3 10*3/uL — ABNORMAL HIGH (ref 4.0–10.5)
nRBC: 0 % (ref 0.0–0.2)

## 2023-12-11 LAB — HIV ANTIBODY (ROUTINE TESTING W REFLEX): HIV Screen 4th Generation wRfx: NONREACTIVE

## 2023-12-11 LAB — HEMOGLOBIN AND HEMATOCRIT, BLOOD
HCT: 31.3 % — ABNORMAL LOW (ref 36.0–46.0)
HCT: 32.7 % — ABNORMAL LOW (ref 36.0–46.0)
Hemoglobin: 10.5 g/dL — ABNORMAL LOW (ref 12.0–15.0)
Hemoglobin: 10.9 g/dL — ABNORMAL LOW (ref 12.0–15.0)

## 2023-12-11 LAB — BASIC METABOLIC PANEL WITH GFR
Anion gap: 8 (ref 5–15)
BUN: 22 mg/dL (ref 8–23)
CO2: 27 mmol/L (ref 22–32)
Calcium: 8.6 mg/dL — ABNORMAL LOW (ref 8.9–10.3)
Chloride: 100 mmol/L (ref 98–111)
Creatinine, Ser: 0.86 mg/dL (ref 0.44–1.00)
GFR, Estimated: >60 mL/min (ref >60)
Glucose, Bld: 96 mg/dL (ref 70–99)
Potassium: 3.8 mmol/L (ref 3.5–5.1)
Sodium: 135 mmol/L (ref 135–145)

## 2023-12-11 LAB — SURGICAL PATHOLOGY

## 2023-12-11 MED ORDER — ALPRAZOLAM 0.25 MG PO TABS
0.2500 mg | ORAL_TABLET | Freq: Three times a day (TID) | ORAL | Status: DC | PRN
  Administered 2023-12-12 – 2023-12-15 (×5): 0.25 mg via ORAL
  Filled 2023-12-11 (×6): qty 1

## 2023-12-11 MED ORDER — HYDROMORPHONE HCL 1 MG/ML IJ SOLN
0.5000 mg | Freq: Three times a day (TID) | INTRAMUSCULAR | Status: DC | PRN
  Administered 2023-12-11 – 2023-12-12 (×2): 0.5 mg via INTRAVENOUS
  Filled 2023-12-11 (×3): qty 0.5

## 2023-12-11 MED ORDER — GABAPENTIN 300 MG PO CAPS
300.0000 mg | ORAL_CAPSULE | Freq: Once | ORAL | Status: AC
  Administered 2023-12-11: 300 mg via ORAL
  Filled 2023-12-11: qty 1

## 2023-12-11 MED ORDER — ALPRAZOLAM 0.25 MG PO TABS: 0.2500 mg | ORAL_TABLET | ORAL | Status: DC | PRN

## 2023-12-11 MED ORDER — HYDROCODONE-ACETAMINOPHEN 5-325 MG PO TABS
1.0000 | ORAL_TABLET | Freq: Four times a day (QID) | ORAL | Status: DC | PRN
  Administered 2023-12-11 – 2023-12-12 (×2): 1 via ORAL
  Filled 2023-12-11 (×3): qty 1

## 2023-12-11 MED ORDER — PNEUMOCOCCAL 20-VAL CONJ VACC 0.5 ML IM SUSY
0.5000 mL | PREFILLED_SYRINGE | INTRAMUSCULAR | Status: DC
  Filled 2023-12-11: qty 0.5

## 2023-12-11 NOTE — ED Notes (Signed)
 Patient given breakfast tray.

## 2023-12-11 NOTE — Progress Notes (Signed)
 [name]   DOB:1955/09/11   FM#:[id number]      ASSESSMENT & PLAN:  [name] is a 68 year old female patient with medical history significant for thrombocytosis.  She had a bone marrow biopsy done on 12/10/2023 as an outpatient.  Patient returned to ED same day complaining of lower back pain radiating down her left leg and nausea and vomiting.  Follows with outpatient hematology/Dr. Loretha.  Retroperitoneal and pelvic hematoma - Status post bone marrow biopsy done 12/10/2023. - CT imaging done 12/10/2023 shows stable left retroperitoneal and left psoas intramuscular hematoma extending to the left pelvis.  No definite evidence of associated active extravasation. - Patient reports that she is feeling much better. - Continue to monitor closely with repeat CT imaging as needed.  Thrombocytosis - Platelet count 616 K today. - Baseline appears to be in the 600-700 range - Patient states that she has had high platelet counts for many years.  Wonders if related to her father who had high WBC count. - Status post bone marrow biopsy done 12/10/2023, path is pending.  Anemia, normocytic - Likely related to recent hematoma as patient's baseline hemoglobin is in the 12-14 range. - Hemoglobin 10.5 today.  No transfusional intervention warranted at this time. - Continue to monitor CBC with differential closely.    Code Status Full  Subjective:  Patient seen awake alert and oriented x 3 laying in bed in the ED.  She is very pleasant and details circumstances that brought her to the ED.  Reports that she had nausea and vomiting x 4 status post bone marrow done.  Also relates feeling crampy and left leg feeling like sciatic pain.  States that she feels much better today and has not experienced any of those symptoms today.  No acute distress is noted.  Objective:  No intake or output data in the 24 hours ending 12/11/23 1231   PHYSICAL EXAMINATION: ECOG PERFORMANCE STATUS: 1 - Symptomatic but  completely ambulatory  Vitals:   12/11/23 0830 12/11/23 1129  BP: 125/71 (!) 107/52  Pulse: 83 90  Resp: 15 17  Temp: 98.4 F (36.9 C)   SpO2: 95% 92%   Filed Weights   12/11/23 0942  Weight: 185 lb 3 oz (84 kg)    GENERAL: alert, no distress and comfortable SKIN: skin color, texture, turgor are normal, no rashes or significant lesions EYES: normal, conjunctiva are pink and non-injected, sclera clear OROPHARYNX: no exudate, no erythema and lips, buccal mucosa, and tongue normal  NECK: supple, thyroid normal size, non-tender, without nodularity LYMPH: no palpable lymphadenopathy in the cervical, axillary or inguinal LUNGS: clear to auscultation and percussion with normal breathing effort HEART: regular rate & rhythm and no murmurs and no lower extremity edema ABDOMEN: abdomen soft, non-tender and normal bowel sounds MUSCULOSKELETAL: + Dressing over left iliac hip area, no bleeding noted on dressing.  No bruising noted over that area. PSYCH: alert & oriented x 3 with fluent speech NEURO: no focal motor/sensory deficits   All questions were answered. The patient knows to call the clinic with any problems, questions or concerns.   The total time spent in the appointment was 40 minutes encounter with patient including review of chart and various tests results, discussions about plan of care and coordination of care plan  [name], NP 12/11/2023 12:31 PM    Labs Reviewed:  Lab Results  Component Value Date   WBC 14.9 (H) 12/11/2023   HGB 10.5 (L) 12/11/2023   HCT 31.8 (  L) 12/11/2023   MCV 96.4 12/11/2023   PLT 616 (H) 12/11/2023   Recent Labs    07/05/23 1100 12/10/23 1946 12/11/23 0508  NA 138 135 135  K 4.4 4.3 3.8  CL 104 99 100  CO2 29 23 27   GLUCOSE 100* 145* 96  BUN 24* 29* 22  CREATININE 0.91 1.04* 0.86  CALCIUM 9.1 8.9 8.6*  GFRNONAA >60 59* >60  PROT 7.0 7.2  --   ALBUMIN 4.0 4.0  --   AST 16 21  --   ALT 15 17  --   ALKPHOS 67 65  --   BILITOT  0.4 0.8  --     Studies Reviewed:  CT ABDOMEN PELVIS W CONTRAST Result Date: 12/10/2023 CLINICAL DATA:  Abdominal trauma, penetrating EXAM: CT ABDOMEN AND PELVIS WITH CONTRAST TECHNIQUE: Multidetector CT imaging of the abdomen and pelvis was performed using the standard protocol following bolus administration of intravenous contrast. RADIATION DOSE REDUCTION: This exam was performed according to the departmental dose-optimization program which includes automated exposure control, adjustment of the mA and/or kV according to patient size and/or use of iterative reconstruction technique. CONTRAST:  OMNIPAQUE IOHEXOL 300 MG/ML  SOLN COMPARISON:  CT lumbar spine 12/10/2023 FINDINGS: Lower chest: No acute abnormality.  Coronary artery calcification. Hepatobiliary: Not enlarged. No focal lesion. No laceration or subcapsular hematoma. The gallbladder is otherwise unremarkable with no radio-opaque gallstones. No biliary ductal dilatation. Pancreas: Normal pancreatic contour. No main pancreatic duct dilatation. Spleen: Not enlarged. No focal lesion. No laceration, subcapsular hematoma, or vascular injury. Adrenals/Urinary Tract: No nodularity bilaterally. Bilateral kidneys enhance symmetrically. No hydronephrosis. No contusion, laceration, or subcapsular hematoma. No injury to the vascular structures or collecting systems. No hydroureter. 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 Otherwise the urinary bladder is unremarkable. Stomach/Bowel: No small or large bowel wall thickening or dilatation. Colonic diverticulosis. The appendix is unremarkable. Vasculature/Lymphatics: No abdominal aorta or iliac aneurysm. No active contrast extravasation or pseudoaneurysm. No abdominal, pelvic, inguinal lymphadenopathy. Reproductive: Normal. Other: Stable left retroperitoneal and left psoas intramuscular hematoma extending to the left pelvis, space of Retzius, and inguinal region. No definite  associated active hemorrhage with limited evaluation due to timing of contrast. No simple free fluid ascites. No pneumoperitoneum. No organized fluid collection. Musculoskeletal: No significant soft tissue hematoma. No acute pelvic fracture. No spinal fracture. Grade 1 anterolisthesis of L5 on S1. Left sacral ala biopsy site noted (2:56). Other ports and devices: None. IMPRESSION: 1. Stable left retroperitoneal and left psoas intramuscular hematoma extending to the left pelvis, space of Retzius, and inguinal region. No definite evidence of associated active extravasation. 2. Other imaging findings of potential clinical significance: Colonic diverticulosis with no acute diverticulitis. Aortic Atherosclerosis (ICD10-I70.0). Electronically Signed   By: Morgane  Naveau M.D.   On: 12/10/2023 23:17   CT Lumbar Spine Wo Contrast Result Date: 12/10/2023 CLINICAL DATA:  Myelopathy, acute, lumbar spine Low back pain radiating down L leg, weakness L leg after bone marrow biopsy Pt bib family for from advice from cancer center pain after bone marrow biopsy. Lower back pain that radiates down groin the left leg. Nausea and vomiting as well. Issues with biopsy today had to attempt three times. EXAM: CT LUMBAR SPINE WITHOUT CONTRAST TECHNIQUE: Multidetector CT imaging of the lumbar spine was performed without intravenous contrast administration. Multiplanar CT image reconstructions were also generated. RADIATION DOSE REDUCTION: This exam was performed according to the departmental dose-optimization program which includes automated exposure control, adjustment of  the mA and/or kV according to patient size and/or use of iterative reconstruction technique. COMPARISON:  None Available. FINDINGS: Segmentation: 5 lumbar type vertebrae. Alignment: Grade 1 anterolisthesis of L5 on S1. Vertebrae: Multilevel mild degenerative changes of the spine. No acute fracture or focal pathologic process. Paraspinal and other soft tissues: Negative.  Disc levels: Intervertebral disc space maintained. Other: Atherosclerotic plaque. Asymmetrically enlarged left psoas muscle with associated retroperitoneal hematoma and fat stranding extending down to the pelvis and presacral space with as well as left pelvic sidewall. Partially visualized 6.2 x 3.8 cm left pelvic sidewall hematoma with markedly limited evaluation on this noncontrast study. Left sacral ala linear lucencies consistent with recent biopsy. IMPRESSION: 1. Left psoas muscle hematoma with associated at least moderate volume retroperitoneal and pelvic hematoma. Partially visualized 6.2 x 3.8 cm left pelvic sidewall hematoma with markedly limited evaluation on this noncontrast study. Recommend further evaluation with a trauma scan with intravenous contrast of the abdomen and pelvis including delayed imaging of the abdomen and pelvis (please make note in comments for CT tech to obtain delayed images of both the abdomen and pelvis). Finding consistent with traumatic injury in the setting of recent biopsy. 2. Left sacral ala linear lucencies consistent with recent biopsy. 3. No acute displaced fracture or traumatic listhesis of the lumbar spine. 4.  Aortic Atherosclerosis (ICD10-I70.0). Electronically Signed   By: Morgane  Naveau M.D.   On: 12/10/2023 21:37

## 2023-12-11 NOTE — Plan of Care (Signed)
   Problem: Activity: Goal: Risk for activity intolerance will decrease Outcome: Progressing   Problem: Pain Managment: Goal: General experience of comfort will improve and/or be controlled Outcome: Progressing   Problem: Safety: Goal: Ability to remain free from injury will improve Outcome: Progressing

## 2023-12-11 NOTE — Progress Notes (Signed)
 PROGRESS NOTE    [name]  FMW:[id number] DOB: 07-30-1955 DOA: 12/10/2023 PCP: Merilee, L.Addie, MD (Inactive)   Brief Narrative:  HPI:  [name] is a 68 y.o. female with medical history significant of neuropathic pain, arthritis, generalized anxiety disorder, essential hypertension, GERD, and chronic pain syndrome presented to emergency complaining of back pain radiating down to the left leg as well as some weakness of the left leg after she had a bone marrow biopsy today. Patient had biopsy for evaluation for essential thrombocytosis.  Patient reported that initially after the biopsy she was able to ambulate but after getting home she started developing some pain and weakness of the left leg.  Denies any bowel or bladder dysfunction.  Denies any saddle anesthesia.  Patient reported that she has episodes of nonbloody nonbilious vomiting at home. Patient underwent left posterior iliac crest biopsy today 7/2. Patient reported left-sided lower abdominal cramping pain with associated left-sided back pain.  Nauseated.  Denies any also vomiting.  No other complaint at this time.   ED Course:  At presentation to ED patient is hemodynamically stable. CBC leukocytosis 16.4 and elevated platelet count 685. CMP showing elevated creatinine 1.04 otherwise unremarkable.  Normal lipase level. UA showing evidence of UTI.  Assessment & Plan:   Principal Problem:   Intramuscular hematoma Active Problems:   Retroperitoneal hematoma   AKI (acute kidney injury) (HCC)   Thrombocytosis   Generalized anxiety disorder   Neuropathic pain   Essential hypertension   GERD (gastroesophageal reflux disease)   Chronic pain syndrome  Acute blood loss anemia secondary to left psoas intramuscular hematoma / Retroperitoneal hematoma, POA: Patient underwent bone marrow biopsy from left iliac crest on the same day of admission.  CT lumbar spine and CT abdomen pelvis showed left psoas muscle hematoma and  stable left retroperitoneal hematoma extending to the left pelvis,space of Retzius, and inguinal region.  There is no active extravasation.  Patient was hemodynamically stable upon arrival.  Case was discussed with general surgery and since there was no active bleeding, there were no further recommendations for intervention.  Oncology/Dr. Sherrod was also made aware and oncology will see patient.  Patient's blood pressure slightly low but is still okay.  Hemoglobin has dropped some and down to 10.5 from 12.6 yesterday.  Will monitor every 12 hours/closely.  Plan to repeat CT if we see evidence of significant drop.  Reactive leukocytosis Thrombocytosis - Elevated WBC count 16.4 and elevated platelet count 685.SABRA  Reactive leukocytosis in the setting of acute illness.  Patient is afebrile.   -Patient is currently undergoing outpatient workup for thrombocytosis.  In the setting of intravascular and retroperitoneal hematoma will defer any antiplatelet therapy.   Acute kidney injury Baseline creatinine appears to be around 0.6/0.7, presented with elevated creatinine 1.04.  Receiving hydration, improving creatinine down to 0.8.  Continue fluids.  Continue to avoid nephrotoxic agents.     Neuropathic pain Chronic pain syndrome - Continue home Norco and gabapentin .   Essential hypertension Holding home blood pressure regimen in the setting of hypotension and AKI.   Generalized anxiety disorder -Continue Prozac .    DVT prophylaxis: SCDs Start: 12/10/23 2345 Place TED hose Start: 12/10/23 2345   Code Status: Full Code  Family Communication:  None present at bedside.  Plan of care discussed with patient in length and he/she verbalized understanding and agreed with it.  Status is: Observation The patient will require care spanning > 2 midnights and should be moved to inpatient  because: Needs another day in the hospital for observation.   Estimated body mass index is 31.53 kg/m as calculated from  the following:   Height as of 07/05/23: 5' 4 (1.626 m).   Weight as of 11/25/23: 83.3 kg.    Nutritional Assessment: There is no height or weight on file to calculate BMI.. Seen by dietician.  I agree with the assessment and plan as outlined below: Nutrition Status:        . Skin Assessment: I have examined the patient's skin and I agree with the wound assessment as performed by the wound care RN as outlined below:    Consultants:  Oncology  Procedures:  Above  Antimicrobials:  Anti-infectives (From admission, onward)    None         Subjective: Patient seen and examined, she still has left flank/groin pain but improved compared to yesterday.  She was under the impression that she was going to be discharged today.  I informed her that due to drop in hemoglobin, she needs further monitoring for another day here.  She is okay with that.  Objective: Vitals:   12/11/23 0430 12/11/23 0607 12/11/23 0614 12/11/23 0632  BP: 110/63  (!) 108/53   Pulse: 89  88   Resp:  17 18   Temp:    98.3 F (36.8 C)  TempSrc:    Axillary  SpO2: 93% 96% 95%    No intake or output data in the 24 hours ending 12/11/23 0829 There were no vitals filed for this visit.  Examination:  General exam: Appears calm and comfortable  Respiratory system: Clear to auscultation. Respiratory effort normal. Cardiovascular system: S1 & S2 heard, RRR. No JVD, murmurs, rubs, gallops or clicks. No pedal edema. Gastrointestinal system: Abdomen is nondistended, soft and nontender. No organomegaly or masses felt. Normal bowel sounds heard. Central nervous system: Alert and oriented. No focal neurological deficits. Extremities: Symmetric 5 x 5 power. Skin: No rashes, lesions or ulcers Psychiatry: Judgement and insight appear normal. Mood & affect appropriate.    Data Reviewed: I have personally reviewed following labs and imaging studies  CBC: Recent Labs  Lab 12/10/23 0834 12/10/23 1946  12/11/23 0508  WBC 8.7 16.4* 14.9*  NEUTROABS 5.4 14.3*  --   HGB 12.6 12.2 10.5*  HCT 36.8 37.5 31.8*  MCV 90.6 95.4 96.4  PLT 600* 685* 616*   Basic Metabolic Panel: Recent Labs  Lab 12/10/23 1946 12/11/23 0508  NA 135 135  K 4.3 3.8  CL 99 100  CO2 23 27  GLUCOSE 145* 96  BUN 29* 22  CREATININE 1.04* 0.86  CALCIUM 8.9 8.6*   GFR: CrCl cannot be calculated (Unknown ideal weight.). Liver Function Tests: Recent Labs  Lab 12/10/23 1946  AST 21  ALT 17  ALKPHOS 65  BILITOT 0.8  PROT 7.2  ALBUMIN 4.0   Recent Labs  Lab 12/10/23 1946  LIPASE 41   No results for input(s): AMMONIA in the last 168 hours. Coagulation Profile: No results for input(s): INR, PROTIME in the last 168 hours. Cardiac Enzymes: No results for input(s): CKTOTAL, CKMB, CKMBINDEX, TROPONINI in the last 168 hours. BNP (last 3 results) No results for input(s): PROBNP in the last 8760 hours. HbA1C: No results for input(s): HGBA1C in the last 72 hours. CBG: No results for input(s): GLUCAP in the last 168 hours. Lipid Profile: No results for input(s): CHOL, HDL, LDLCALC, TRIG, CHOLHDL, LDLDIRECT in the last 72 hours. Thyroid Function Tests: No results for  input(s): TSH, T4TOTAL, FREET4, T3FREE, THYROIDAB in the last 72 hours. Anemia Panel: No results for input(s): VITAMINB12, FOLATE, FERRITIN, TIBC, IRON, RETICCTPCT in the last 72 hours. Sepsis Labs: No results for input(s): PROCALCITON, LATICACIDVEN in the last 168 hours.  No results found for this or any previous visit (from the past 240 hours).   Radiology Studies: CT ABDOMEN PELVIS W CONTRAST Result Date: 12/10/2023 CLINICAL DATA:  Abdominal trauma, penetrating EXAM: CT ABDOMEN AND PELVIS WITH CONTRAST TECHNIQUE: Multidetector CT imaging of the abdomen and pelvis was performed using the standard protocol following bolus administration of intravenous contrast. RADIATION DOSE  REDUCTION: This exam was performed according to the departmental dose-optimization program which includes automated exposure control, adjustment of the mA and/or kV according to patient size and/or use of iterative reconstruction technique. CONTRAST:  OMNIPAQUE IOHEXOL 300 MG/ML  SOLN COMPARISON:  CT lumbar spine 12/10/2023 FINDINGS: Lower chest: No acute abnormality.  Coronary artery calcification. Hepatobiliary: Not enlarged. No focal lesion. No laceration or subcapsular hematoma. The gallbladder is otherwise unremarkable with no radio-opaque gallstones. No biliary ductal dilatation. Pancreas: Normal pancreatic contour. No main pancreatic duct dilatation. Spleen: Not enlarged. No focal lesion. No laceration, subcapsular hematoma, or vascular injury. Adrenals/Urinary Tract: No nodularity bilaterally. Bilateral kidneys enhance symmetrically. No hydronephrosis. No contusion, laceration, or subcapsular hematoma. No injury to the vascular structures or collecting systems. No hydroureter. 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 Otherwise the urinary bladder is unremarkable. Stomach/Bowel: No small or large bowel wall thickening or dilatation. Colonic diverticulosis. The appendix is unremarkable. Vasculature/Lymphatics: No abdominal aorta or iliac aneurysm. No active contrast extravasation or pseudoaneurysm. No abdominal, pelvic, inguinal lymphadenopathy. Reproductive: Normal. Other: Stable left retroperitoneal and left psoas intramuscular hematoma extending to the left pelvis, space of Retzius, and inguinal region. No definite associated active hemorrhage with limited evaluation due to timing of contrast. No simple free fluid ascites. No pneumoperitoneum. No organized fluid collection. Musculoskeletal: No significant soft tissue hematoma. No acute pelvic fracture. No spinal fracture. Grade 1 anterolisthesis of L5 on S1. Left sacral ala biopsy site noted (2:56). Other ports and  devices: None. IMPRESSION: 1. Stable left retroperitoneal and left psoas intramuscular hematoma extending to the left pelvis, space of Retzius, and inguinal region. No definite evidence of associated active extravasation. 2. Other imaging findings of potential clinical significance: Colonic diverticulosis with no acute diverticulitis. Aortic Atherosclerosis (ICD10-I70.0). Electronically Signed   By: Morgane  Naveau M.D.   On: 12/10/2023 23:17   CT Lumbar Spine Wo Contrast Result Date: 12/10/2023 CLINICAL DATA:  Myelopathy, acute, lumbar spine Low back pain radiating down L leg, weakness L leg after bone marrow biopsy Pt bib family for from advice from cancer center pain after bone marrow biopsy. Lower back pain that radiates down groin the left leg. Nausea and vomiting as well. Issues with biopsy today had to attempt three times. EXAM: CT LUMBAR SPINE WITHOUT CONTRAST TECHNIQUE: Multidetector CT imaging of the lumbar spine was performed without intravenous contrast administration. Multiplanar CT image reconstructions were also generated. RADIATION DOSE REDUCTION: This exam was performed according to the departmental dose-optimization program which includes automated exposure control, adjustment of the mA and/or kV according to patient size and/or use of iterative reconstruction technique. COMPARISON:  None Available. FINDINGS: Segmentation: 5 lumbar type vertebrae. Alignment: Grade 1 anterolisthesis of L5 on S1. Vertebrae: Multilevel mild degenerative changes of the spine. No acute fracture or focal pathologic process. Paraspinal and other soft tissues: Negative. Disc levels: Intervertebral disc space maintained.  Other: Atherosclerotic plaque. Asymmetrically enlarged left psoas muscle with associated retroperitoneal hematoma and fat stranding extending down to the pelvis and presacral space with as well as left pelvic sidewall. Partially visualized 6.2 x 3.8 cm left pelvic sidewall hematoma with markedly limited  evaluation on this noncontrast study. Left sacral ala linear lucencies consistent with recent biopsy. IMPRESSION: 1. Left psoas muscle hematoma with associated at least moderate volume retroperitoneal and pelvic hematoma. Partially visualized 6.2 x 3.8 cm left pelvic sidewall hematoma with markedly limited evaluation on this noncontrast study. Recommend further evaluation with a trauma scan with intravenous contrast of the abdomen and pelvis including delayed imaging of the abdomen and pelvis (please make note in comments for CT tech to obtain delayed images of both the abdomen and pelvis). Finding consistent with traumatic injury in the setting of recent biopsy. 2. Left sacral ala linear lucencies consistent with recent biopsy. 3. No acute displaced fracture or traumatic listhesis of the lumbar spine. 4.  Aortic Atherosclerosis (ICD10-I70.0). Electronically Signed   By: Morgane  Naveau M.D.   On: 12/10/2023 21:37    Scheduled Meds:  ferrous sulfate   324 mg Oral Q breakfast   FLUoxetine   20 mg Oral q morning   gabapentin   300 mg Oral TID   pantoprazole   40 mg Oral Daily   senna-docusate  1 tablet Oral BID   sodium chloride  flush  3 mL Intravenous Q12H   Continuous Infusions:  sodium chloride      lactated ringers  125 mL/hr at 12/11/23 0230     LOS: 0 days   Fredia Skeeter, MD [facility]  12/11/2023, 8:29 AM   *Please note that this is a verbal dictation therefore any spelling or grammatical errors are due to the Dragon [employer] One system interpretation.  Please page via Amion and do not message via secure chat for urgent patient care matters. Secure chat can be used for non urgent patient care matters.  How to contact the [facility] Attending or Consulting provider 7A - 7P or covering provider during after hours 7P -7A, for this patient?  Check the care team in [facility] and look for a) attending/consulting [facility] provider listed and b) the [facility] team listed. Page or secure chat 7A-7P. Log into  [email or web address] and use [place]'s universal password to access. If you do not have the password, please contact the hospital operator. Locate the [facility] provider you are looking for under [facility] and page to a number that you can be directly reached. If you still have difficulty reaching the provider, please page the [facility] (Director on Call) for the Hospitalists listed on amion for assistance.

## 2023-12-12 DIAGNOSIS — T148XXA Other injury of unspecified body region, initial encounter: Secondary | ICD-10-CM | POA: Diagnosis not present

## 2023-12-12 LAB — CBC WITH DIFFERENTIAL/PLATELET
Abs Immature Granulocytes: 0.09 K/uL — ABNORMAL HIGH (ref 0.00–0.07)
Basophils Absolute: 0.1 K/uL (ref 0.0–0.1)
Basophils Relative: 0 %
Eosinophils Absolute: 0.1 K/uL (ref 0.0–0.5)
Eosinophils Relative: 1 %
HCT: 30.8 % — ABNORMAL LOW (ref 36.0–46.0)
Hemoglobin: 9.8 g/dL — ABNORMAL LOW (ref 12.0–15.0)
Immature Granulocytes: 1 %
Lymphocytes Relative: 14 %
Lymphs Abs: 1.9 K/uL (ref 0.7–4.0)
MCH: 30.8 pg (ref 26.0–34.0)
MCHC: 31.8 g/dL (ref 30.0–36.0)
MCV: 96.9 fL (ref 80.0–100.0)
Monocytes Absolute: 1 K/uL (ref 0.1–1.0)
Monocytes Relative: 7 %
Neutro Abs: 10.8 K/uL — ABNORMAL HIGH (ref 1.7–7.7)
Neutrophils Relative %: 77 %
Platelets: 569 K/uL — ABNORMAL HIGH (ref 150–400)
RBC: 3.18 MIL/uL — ABNORMAL LOW (ref 3.87–5.11)
RDW: 14.8 % (ref 11.5–15.5)
WBC: 13.9 K/uL — ABNORMAL HIGH (ref 4.0–10.5)
nRBC: 0 % (ref 0.0–0.2)

## 2023-12-12 MED ORDER — LIDOCAINE 5 % EX PTCH
2.0000 | MEDICATED_PATCH | CUTANEOUS | Status: DC
  Administered 2023-12-12 – 2023-12-15 (×3): 2 via TRANSDERMAL
  Filled 2023-12-12 (×3): qty 2

## 2023-12-12 MED ORDER — HYDROCODONE-ACETAMINOPHEN 5-325 MG PO TABS: 1.0000 | ORAL_TABLET | ORAL | Status: DC | PRN

## 2023-12-12 MED ORDER — HYDROMORPHONE HCL 1 MG/ML IJ SOLN
0.5000 mg | Freq: Once | INTRAMUSCULAR | Status: AC
  Administered 2023-12-12: 0.5 mg via INTRAVENOUS

## 2023-12-12 MED ORDER — HYDROMORPHONE HCL 1 MG/ML IJ SOLN
0.5000 mg | Freq: Once | INTRAMUSCULAR | Status: AC
  Administered 2023-12-12: 0.5 mg via INTRAVENOUS
  Filled 2023-12-12: qty 0.5

## 2023-12-12 MED ORDER — HYDROCODONE-ACETAMINOPHEN 5-325 MG PO TABS
1.0000 | ORAL_TABLET | ORAL | Status: DC | PRN
  Administered 2023-12-12 – 2023-12-13 (×4): 2 via ORAL
  Administered 2023-12-13: 1 via ORAL
  Administered 2023-12-13 – 2023-12-15 (×9): 2 via ORAL
  Filled 2023-12-12 (×3): qty 1
  Filled 2023-12-12 (×6): qty 2
  Filled 2023-12-12: qty 1
  Filled 2023-12-12 (×6): qty 2

## 2023-12-12 MED ORDER — HYDROMORPHONE HCL 1 MG/ML IJ SOLN
0.5000 mg | INTRAMUSCULAR | Status: DC | PRN
  Administered 2023-12-12: 0.5 mg via INTRAVENOUS
  Filled 2023-12-12: qty 0.5

## 2023-12-12 NOTE — Progress Notes (Signed)
 Pt's pain started again suddenly with 8/10 intensity. Ultimately asked for 2 doses of dilaudid  to equal 1mg  total. Pt's pain now at a 4/10.

## 2023-12-12 NOTE — Evaluation (Signed)
 Occupational Therapy Evaluation Patient Details Name: [name] MRN: [id number] DOB: Apr 03, 1956 Today's Date: 12/12/2023   History of Present Illness   [name] is a 68 year old female patient with medical history significant for thrombocytosis.  She had a bone marrow biopsy done on 12/10/2023 as an outpatient.  Patient returned to ED same day complaining of lower back pain radiating down her left leg and nausea and vomiting.  Follows with outpatient hematology/Dr. Loretha.Admitted with retroperitoneal and pelvic hematoma.     Clinical Impressions Patient evaluated by Occupational Therapy with no further acute OT needs identified. All education has been completed and the patient has no further questions. Husband present for session. Use of RW to offload for pain management effective. See below for any follow-up Occupational Therapy or equipment needs. OT is signing off. Thank you for this referral.      If plan is discharge home, recommend the following:   A little help with walking and/or transfers;Assist for transportation;Help with stairs or ramp for entrance     Functional Status Assessment   Patient has not had a recent decline in their functional status     Equipment Recommendations   None recommended by OT      Precautions/Restrictions   Precautions Precautions: Fall Restrictions Weight Bearing Restrictions Per Provider Order: No     Mobility Bed Mobility               General bed mobility comments: in recliner and remained    Transfers Overall transfer level: Needs assistance Equipment used: Rolling walker (2 wheels) Transfers: Sit to/from Stand, Bed to chair/wheelchair/BSC Sit to Stand: Supervision     Step pivot transfers: Supervision     General transfer comment: pain significantly improved with use of RW for off-loading, amb in hallway 75 ft with Supervision      Balance Overall balance assessment: Mild deficits observed, not  formally tested                                         ADL either performed or assessed with clinical judgement   ADL Overall ADL's : Modified independent                                       General ADL Comments: use of RW to access bathroom to regular toilet, no ssistance required other than AD support for pain management     Vision Baseline Vision/History: 0 No visual deficits;1 Wears glasses              Pertinent Vitals/Pain Pain Assessment Pain Assessment: 0-10 Pain Score: 3  Pain Location: L LE, hip and back Pain Descriptors / Indicators: Aching, Burning, Sharp, Shooting, Stabbing Pain Intervention(s): Limited activity within patient's tolerance, Monitored during session, Premedicated before session, Repositioned, Patient requesting pain meds-RN notified, Ice applied     Extremity/Trunk Assessment Upper Extremity Assessment Upper Extremity Assessment: Overall WFL for tasks assessed;RUE deficits/detail   Lower Extremity Assessment Lower Extremity Assessment: Defer to PT evaluation   Cervical / Trunk Assessment Cervical / Trunk Assessment: Normal   Communication Communication Communication: No apparent difficulties   Cognition Arousal: Alert Behavior During Therapy: WFL for tasks assessed/performed Cognition: No apparent impairments  Following commands: Intact       Cueing  General Comments   Cueing Techniques: Verbal cues  ice applied to hip region, no SOB throughtout session   Exercises General Exercises - Lower Extremity Ankle Circles/Pumps:  (pt encoruaged to perform ankle pumps while seated in recliner)        Home Living Family/patient expects to be discharged to:: Private residence Living Arrangements: Spouse/significant other Available Help at Discharge: Family Type of Home: House Home Access: Stairs to enter [employer] of Steps: 3-4 in back and 6  in the front of the home Entrance Stairs-Rails: Right Home Layout: Two level;Bed/bath upstairs Alternate Level Stairs-Number of Steps: 15 Alternate Level Stairs-Rails: Right Bathroom Shower/Tub: Door;Walk-in Human resources officer: Handicapped height Bathroom Accessibility: Yes How Accessible: Accessible via walker Home Equipment: Rollator (4 wheels)          Prior Functioning/Environment Prior Level of Function : Independent/Modified Independent             Mobility Comments: IND no AD for all ADLs, self care tasks and IADLs ADLs Comments: patient independent with all aspects ADL's and IADL's     AM-PAC OT 6 Clicks Daily Activity     Outcome Measure Help from another person eating meals?: None Help from another person taking care of personal grooming?: None Help from another person toileting, which includes using toliet, bedpan, or urinal?: None Help from another person bathing (including washing, rinsing, drying)?: None Help from another person to put on and taking off regular upper body clothing?: None Help from another person to put on and taking off regular lower body clothing?: None 6 Click Score: 24   End of Session Equipment Utilized During Treatment: Gait belt;Rolling walker (2 wheels) Nurse Communication: Mobility status  Activity Tolerance: Patient tolerated treatment well Patient left: in chair;with call bell/phone within reach;with chair alarm set;with family/visitor present                   Time: 8590-8570 OT Time Calculation (min): 20 min Charges:  OT General Charges $OT Visit: 1 Visit OT Evaluation $OT Eval Low Complexity: 1 Low Araiya Tilmon OT/L Acute Rehabilitation Department  [phone]  12/12/2023, 3:51 PM

## 2023-12-12 NOTE — Plan of Care (Signed)
 Alert and oriented.  Medicated for pain and anxiety, see MAR for details.  Tolerating getting OOB to chair with PT.   Problem: Education: Goal: Knowledge of General Education information will improve Description: Including pain rating scale, medication(s)/side effects and non-pharmacologic comfort measures Outcome: Progressing   Problem: Health Behavior/Discharge Planning: Goal: Ability to manage health-related needs will improve Outcome: Progressing   Problem: Clinical Measurements: Goal: Ability to maintain clinical measurements within normal limits will improve Outcome: Progressing Goal: Will remain free from infection Outcome: Progressing Goal: Diagnostic test results will improve Outcome: Progressing

## 2023-12-12 NOTE — Progress Notes (Signed)
 PROGRESS NOTE    PERSAIS ETHRIDGE  FMW:[id number] DOB: [birth year] DOA: 12/10/2023 PCP: Merilee, L.Addie, MD (Inactive)   Brief Narrative:  HPI:  [name] is a 68 y.o. female with medical history significant of neuropathic pain, arthritis, generalized anxiety disorder, essential hypertension, GERD, and chronic pain syndrome presented to emergency complaining of back pain radiating down to the left leg as well as some weakness of the left leg after she had a bone marrow biopsy today. Patient had biopsy for evaluation for essential thrombocytosis.  Patient reported that initially after the biopsy she was able to ambulate but after getting home she started developing some pain and weakness of the left leg.  Denies any bowel or bladder dysfunction.  Denies any saddle anesthesia.  Patient reported that she has episodes of nonbloody nonbilious vomiting at home. Patient underwent left posterior iliac crest biopsy today 7/2. Patient reported left-sided lower abdominal cramping pain with associated left-sided back pain.  Nauseated.  Denies any also vomiting.  No other complaint at this time.   ED Course:  At presentation to ED patient is hemodynamically stable. CBC leukocytosis 16.4 and elevated platelet count 685. CMP showing elevated creatinine 1.04 otherwise unremarkable.  Normal lipase level. UA showing evidence of UTI.  Assessment & Plan:   Principal Problem:   Intramuscular hematoma Active Problems:   Retroperitoneal hematoma   AKI (acute kidney injury) (HCC)   Thrombocytosis   Generalized anxiety disorder   Neuropathic pain   Essential hypertension   GERD (gastroesophageal reflux disease)   Chronic pain syndrome   Retroperitoneal bleeding  Acute blood loss anemia secondary to left psoas intramuscular hematoma / Retroperitoneal hematoma, POA: Patient underwent bone marrow biopsy from left iliac crest on the same day of admission.  CT lumbar spine and CT abdomen pelvis showed  left psoas muscle hematoma and stable left retroperitoneal hematoma extending to the left pelvis,space of Retzius, and inguinal region.  There is no active extravasation.  Patient was hemodynamically stable upon arrival.  Case was discussed with general surgery and since there was no active bleeding, there were no further recommendations for intervention.  Oncology also on board, patient's hemoglobin has remained stable around 10.5 as well so from that perspective, she is stable for discharge however patient says that she has significant pain and she does not think she can go home.  She has required significant amount of IV Dilaudid  this morning.  Discussed in length with the patient, based today to prepare her for discharge would be to transition from IV to oral medication, I have increased her Vicodin for 1 tablet for moderate and 2 tablets for severe pain and increase the frequency to every 4 hours in agreement with her.  I have consulted PT OT to assess her as well.  Reactive leukocytosis Thrombocytosis - Elevated WBC count 16.4 and elevated platelet count 685.SABRA  Reactive leukocytosis in the setting of acute illness.  Patient is afebrile.   -Patient is currently undergoing outpatient workup for thrombocytosis.  In the setting of intravascular and retroperitoneal hematoma will defer any antiplatelet therapy.   Acute kidney injury Baseline creatinine appears to be around 0.6/0.7, presented with elevated creatinine 1.04.  Receiving hydration, improving creatinine down to 0.8.  Continue fluids.  Continue to avoid nephrotoxic agents.     Neuropathic pain Chronic pain syndrome - Continue home Norco and gabapentin .   Essential hypertension Holding home blood pressure regimen in the setting of hypotension and AKI.   Generalized anxiety disorder -Continue Prozac .  DVT prophylaxis: SCDs Start: 12/10/23 2345 Place TED hose Start: 12/10/23 2345   Code Status: Full Code  Family Communication:  None  present at bedside.  Plan of care discussed with patient in length and he/she verbalized understanding and agreed with it.  Status is: Inpatient Remains inpatient appropriate because: Patient in severe pain, unable to go home.  Needs PT OT assessment.     Estimated body mass index is 31.79 kg/m as calculated from the following:   Height as of this encounter: 5' 4 (1.626 m).   Weight as of this encounter: 84 kg.    Nutritional Assessment: Body mass index is 31.79 kg/m.SABRA Seen by dietician.  I agree with the assessment and plan as outlined below: Nutrition Status:        . Skin Assessment: I have examined the patient's skin and I agree with the wound assessment as performed by the wound care RN as outlined below:    Consultants:  Oncology  Procedures:  Above  Antimicrobials:  Anti-infectives (From admission, onward)    None         Subjective: Seen and examined, complains of severe pain in the left lower quadrant and left flank area.  No other complaint.  She does not feel that she can go home today.  Objective: Vitals:   12/11/23 1327 12/11/23 1739 12/11/23 2100 12/12/23 0203  BP: (!) 107/47 132/67 110/78 (!) 105/48  Pulse: 85 90 89 79  Resp:  16  18  Temp: 97.7 F (36.5 C) 97.9 F (36.6 C) 98 F (36.7 C) 97.9 F (36.6 C)  TempSrc: Oral     SpO2: 97% 92% 98% 98%  Weight:      Height:        Intake/Output Summary (Last 24 hours) at 12/12/2023 1111 Last data filed at 12/12/2023 1053 Gross per 24 hour  Intake 1243 ml  Output --  Net 1243 ml   Filed Weights   12/11/23 0942  Weight: 84 kg    Examination:  General exam: Appears calm and comfortable  Respiratory system: Clear to auscultation. Respiratory effort normal. Cardiovascular system: S1 & S2 heard, RRR. No JVD, murmurs, rubs, gallops or clicks. No pedal edema. Gastrointestinal system: Abdomen is nondistended, soft and left lower quadrant tenderness. No organomegaly or masses felt. Normal  bowel sounds heard. Central nervous system: Alert and oriented. No focal neurological deficits. Extremities: Symmetric 5 x 5 power. Skin: No rashes, lesions or ulcers.  Psychiatry: Judgement and insight appear normal. Mood & affect appropriate.   Data Reviewed: I have personally reviewed following labs and imaging studies  CBC: Recent Labs  Lab 12/10/23 0834 12/10/23 0834 12/10/23 1946 12/11/23 0508 12/11/23 1338 12/11/23 1655 12/11/23 1904 12/12/23 0530  WBC 8.7  --  16.4* 14.9*  --  13.3*  --  13.9*  NEUTROABS 5.4  --  14.3*  --   --  9.0*  --  10.8*  HGB 12.6   < > 12.2 10.5* 10.5* 10.6* 10.9* 9.8*  HCT 36.8  --  37.5 31.8* 31.3* 32.5* 32.7* 30.8*  MCV 90.6  --  95.4 96.4  --  95.0  --  96.9  PLT 600*  --  685* 616*  --  643*  --  569*   < > = values in this interval not displayed.   Basic Metabolic Panel: Recent Labs  Lab 12/10/23 1946 12/11/23 0508  NA 135 135  K 4.3 3.8  CL 99 100  CO2 23 27  GLUCOSE  145* 96  BUN 29* 22  CREATININE 1.04* 0.86  CALCIUM 8.9 8.6*   GFR: Estimated Creatinine Clearance: 65.6 mL/min (by C-G formula based on SCr of 0.86 mg/dL). Liver Function Tests: Recent Labs  Lab 12/10/23 1946  AST 21  ALT 17  ALKPHOS 65  BILITOT 0.8  PROT 7.2  ALBUMIN 4.0   Recent Labs  Lab 12/10/23 1946  LIPASE 41   No results for input(s): AMMONIA in the last 168 hours. Coagulation Profile: No results for input(s): INR, PROTIME in the last 168 hours. Cardiac Enzymes: No results for input(s): CKTOTAL, CKMB, CKMBINDEX, TROPONINI in the last 168 hours. BNP (last 3 results) No results for input(s): PROBNP in the last 8760 hours. HbA1C: No results for input(s): HGBA1C in the last 72 hours. CBG: No results for input(s): GLUCAP in the last 168 hours. Lipid Profile: No results for input(s): CHOL, HDL, LDLCALC, TRIG, CHOLHDL, LDLDIRECT in the last 72 hours. Thyroid Function Tests: No results for input(s): TSH,  T4TOTAL, FREET4, T3FREE, THYROIDAB in the last 72 hours. Anemia Panel: No results for input(s): VITAMINB12, FOLATE, FERRITIN, TIBC, IRON, RETICCTPCT in the last 72 hours. Sepsis Labs: No results for input(s): PROCALCITON, LATICACIDVEN in the last 168 hours.  No results found for this or any previous visit (from the past 240 hours).   Radiology Studies: CT ABDOMEN PELVIS W CONTRAST Result Date: 12/10/2023 CLINICAL DATA:  Abdominal trauma, penetrating EXAM: CT ABDOMEN AND PELVIS WITH CONTRAST TECHNIQUE: Multidetector CT imaging of the abdomen and pelvis was performed using the standard protocol following bolus administration of intravenous contrast. RADIATION DOSE REDUCTION: This exam was performed according to the departmental dose-optimization program which includes automated exposure control, adjustment of the mA and/or kV according to patient size and/or use of iterative reconstruction technique. CONTRAST:  OMNIPAQUE  IOHEXOL  300 MG/ML  SOLN COMPARISON:  CT lumbar spine 12/10/2023 FINDINGS: Lower chest: No acute abnormality.  Coronary artery calcification. Hepatobiliary: Not enlarged. No focal lesion. No laceration or subcapsular hematoma. The gallbladder is otherwise unremarkable with no radio-opaque gallstones. No biliary ductal dilatation. Pancreas: Normal pancreatic contour. No main pancreatic duct dilatation. Spleen: Not enlarged. No focal lesion. No laceration, subcapsular hematoma, or vascular injury. Adrenals/Urinary Tract: No nodularity bilaterally. Bilateral kidneys enhance symmetrically. No hydronephrosis. No contusion, laceration, or subcapsular hematoma. No injury to the vascular structures or collecting systems. No hydroureter. 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 Otherwise the urinary bladder is unremarkable. Stomach/Bowel: No small or large bowel wall thickening or dilatation. Colonic diverticulosis. The appendix is  unremarkable. Vasculature/Lymphatics: No abdominal aorta or iliac aneurysm. No active contrast extravasation or pseudoaneurysm. No abdominal, pelvic, inguinal lymphadenopathy. Reproductive: Normal. Other: Stable left retroperitoneal and left psoas intramuscular hematoma extending to the left pelvis, space of Retzius, and inguinal region. No definite associated active hemorrhage with limited evaluation due to timing of contrast. No simple free fluid ascites. No pneumoperitoneum. No organized fluid collection. Musculoskeletal: No significant soft tissue hematoma. No acute pelvic fracture. No spinal fracture. Grade 1 anterolisthesis of L5 on S1. Left sacral ala biopsy site noted (2:56). Other ports and devices: None. IMPRESSION: 1. Stable left retroperitoneal and left psoas intramuscular hematoma extending to the left pelvis, space of Retzius, and inguinal region. No definite evidence of associated active extravasation. 2. Other imaging findings of potential clinical significance: Colonic diverticulosis with no acute diverticulitis. Aortic Atherosclerosis (ICD10-I70.0). Electronically Signed   By: Morgane  Naveau M.D.   On: 12/10/2023 23:17   CT Lumbar Spine Wo Contrast  Result Date: 12/10/2023 CLINICAL DATA:  Myelopathy, acute, lumbar spine Low back pain radiating down L leg, weakness L leg after bone marrow biopsy Pt bib family for from advice from cancer center pain after bone marrow biopsy. Lower back pain that radiates down groin the left leg. Nausea and vomiting as well. Issues with biopsy today had to attempt three times. EXAM: CT LUMBAR SPINE WITHOUT CONTRAST TECHNIQUE: Multidetector CT imaging of the lumbar spine was performed without intravenous contrast administration. Multiplanar CT image reconstructions were also generated. RADIATION DOSE REDUCTION: This exam was performed according to the departmental dose-optimization program which includes automated exposure control, adjustment of the mA and/or kV  according to patient size and/or use of iterative reconstruction technique. COMPARISON:  None Available. FINDINGS: Segmentation: 5 lumbar type vertebrae. Alignment: Grade 1 anterolisthesis of L5 on S1. Vertebrae: Multilevel mild degenerative changes of the spine. No acute fracture or focal pathologic process. Paraspinal and other soft tissues: Negative. Disc levels: Intervertebral disc space maintained. Other: Atherosclerotic plaque. Asymmetrically enlarged left psoas muscle with associated retroperitoneal hematoma and fat stranding extending down to the pelvis and presacral space with as well as left pelvic sidewall. Partially visualized 6.2 x 3.8 cm left pelvic sidewall hematoma with markedly limited evaluation on this noncontrast study. Left sacral ala linear lucencies consistent with recent biopsy. IMPRESSION: 1. Left psoas muscle hematoma with associated at least moderate volume retroperitoneal and pelvic hematoma. Partially visualized 6.2 x 3.8 cm left pelvic sidewall hematoma with markedly limited evaluation on this noncontrast study. Recommend further evaluation with a trauma scan with intravenous contrast of the abdomen and pelvis including delayed imaging of the abdomen and pelvis (please make note in comments for CT tech to obtain delayed images of both the abdomen and pelvis). Finding consistent with traumatic injury in the setting of recent biopsy. 2. Left sacral ala linear lucencies consistent with recent biopsy. 3. No acute displaced fracture or traumatic listhesis of the lumbar spine. 4.  Aortic Atherosclerosis (ICD10-I70.0). Electronically Signed   By: Morgane  Naveau M.D.   On: 12/10/2023 21:37    Scheduled Meds:  ferrous sulfate   324 mg Oral Q breakfast   FLUoxetine   20 mg Oral q morning   gabapentin   300 mg Oral TID   lidocaine   2 patch Transdermal Q24H   pantoprazole   40 mg Oral Daily   pneumococcal 20-valent conjugate vaccine  0.5 mL Intramuscular Tomorrow-1000   senna-docusate  1  tablet Oral BID   sodium chloride  flush  3 mL Intravenous Q12H   Continuous Infusions:     LOS: 1 day   Fredia Skeeter, MD [facility]  12/12/2023, 11:11 AM   *Please note that this is a verbal dictation therefore any spelling or grammatical errors are due to the Dragon [employer] One system interpretation.  Please page via Amion and do not message via secure chat for urgent patient care matters. Secure chat can be used for non urgent patient care matters.  How to contact the [facility] Attending or Consulting provider 7A - 7P or covering provider during after hours 7P -7A, for this patient?  Check the care team in [facility] and look for a) attending/consulting [facility] provider listed and b) the [facility] team listed. Page or secure chat 7A-7P. Log into [email or web address] and use [facility] universal password to access. If you do not have the password, please contact the hospital operator. Locate the [facility] provider you are looking for under [facility] and page to a number that you can be directly reached.  If you still have difficulty reaching the provider, please page the [facility] (Director on Call) for the Hospitalists listed on amion for assistance.

## 2023-12-12 NOTE — Evaluation (Signed)
 Physical Therapy Evaluation Patient Details Name: [name] MRN: [id number] DOB: Sep 13, 1955 Today's Date: 12/12/2023  History of Present Illness  [name] is a 68 year old female patient with medical history significant for thrombocytosis.  She had a bone marrow biopsy done on 12/10/2023 as an outpatient.  Patient returned to ED same day complaining of lower back pain radiating down her left leg and nausea and vomiting.  Follows with outpatient hematology/Dr. Loretha.Admitted with retroperitoneal and pelvic hematoma.  Clinical Impression   Pt admitted with above diagnosis.  Pt currently with functional limitations due to the deficits listed below (see PT Problem List). Pt in bed when therapist arrived. Pt agreeable to therapy intervention and in R sidelying to offload  L LE in bed, pt reported mobility increases pain. Pt required min cues, use of hospital bed and S for supine to sit, CGA for sit to stand  from EOB to RW, gait tasks with RW for pain management, safety, stability in hallway 160 feet. Minimal increase in pain with functional mobility tasks. Pt left seated in recliner, all needs in place and CP applied to L LE.  Pt will benefit from acute skilled PT to increase their independence and safety with mobility to allow discharge.         If plan is discharge home, recommend the following: A little help with walking and/or transfers;A little help with bathing/dressing/bathroom;Assistance with cooking/housework;Assist for transportation;Help with stairs or ramp for entrance   Can travel by private vehicle        Equipment Recommendations Rolling walker (2 wheels) (pending pt progression)  Recommendations for Other Services       Functional Status Assessment Patient has had a recent decline in their functional status and demonstrates the ability to make significant improvements in function in a reasonable and predictable amount of time.     Precautions / Restrictions  Precautions Precautions: Fall Restrictions Weight Bearing Restrictions Per Provider Order: No      Mobility  Bed Mobility Overal bed mobility: Needs Assistance Bed Mobility: Supine to Sit     Supine to sit: Supervision, HOB elevated     General bed mobility comments: min cues    Transfers Overall transfer level: Needs assistance Equipment used: Rolling walker (2 wheels) Transfers: Sit to/from Stand Sit to Stand: Contact guard assist           General transfer comment: min cues and pull to stand at RW    Ambulation/Gait Ambulation/Gait assistance: Contact guard assist, Supervision Gait Distance (Feet): 160 Feet Assistive device: Rolling walker (2 wheels) Gait Pattern/deviations: Step-through pattern, Antalgic Gait velocity: decreased     General Gait Details: pt slightly self limiting L LE WB however demonstrating good reciprocal pattern with B foot clearance and pt indicated pain increased from 3/10 to 4/10 with mobility tasks, pt IND without AD at PLOF gait not assessed without AD due to pain response  Stairs            Wheelchair Mobility     Tilt Bed    Modified Rankin (Stroke Patients Only)       Balance Overall balance assessment: Mild deficits observed, not formally tested                                           Pertinent Vitals/Pain Pain Assessment Pain Assessment: 0-10 Pain Score: 4  Pain Location: L LE, hip  and back Pain Descriptors / Indicators: Aching, Burning, Sharp, Shooting, Stabbing Pain Intervention(s): Limited activity within patient's tolerance, Monitored during session, Premedicated before session, Repositioned, Ice applied    Home Living Family/patient expects to be discharged to:: Private residence Living Arrangements: Spouse/significant other Available Help at Discharge: Family Type of Home: House Home Access: Stairs to enter Entrance Stairs-Rails: Right Entrance Stairs-Number of Steps: 3-4 in  back and 6 in the front of the home Alternate Level Stairs-Number of Steps: 15 Home Layout: Two level;Bed/bath upstairs Home Equipment: None      Prior Function Prior Level of Function : Independent/Modified Independent             Mobility Comments: IND no AD for all ADLs, self care tasks and IADLs       Extremity/Trunk Assessment        Lower Extremity Assessment Lower Extremity Assessment: Generalized weakness    Cervical / Trunk Assessment Cervical / Trunk Assessment: Normal  Communication   Communication Communication: No apparent difficulties    Cognition Arousal: Alert Behavior During Therapy: WFL for tasks assessed/performed   PT - Cognitive impairments: No apparent impairments                         Following commands: Intact       Cueing       General Comments      Exercises General Exercises - Lower Extremity Ankle Circles/Pumps: AROM, Both, 10 reps (pt encoruaged to perform ankle pumps while seated in recliner)   Assessment/Plan    PT Assessment Patient needs continued PT services  PT Problem List Decreased strength;Decreased activity tolerance;Decreased balance;Decreased mobility;Decreased coordination;Pain       PT Treatment Interventions DME instruction;Gait training;Stair training;Functional mobility training;Therapeutic activities;Therapeutic exercise;Balance training;Neuromuscular re-education;Patient/family education;Modalities    PT Goals (Current goals can be found in the Care Plan section)  Acute Rehab PT Goals Patient Stated Goal: to not having the sharp shooting grabing pain down L LE and be able to go home PT Goal Formulation: With patient Time For Goal Achievement: 12/26/23 Potential to Achieve Goals: Good    Frequency Min 3X/week     Co-evaluation               AM-PAC PT 6 Clicks Mobility  Outcome Measure Help needed turning from your back to your side while in a flat bed without using bedrails?:  None Help needed moving from lying on your back to sitting on the side of a flat bed without using bedrails?: None Help needed moving to and from a bed to a chair (including a wheelchair)?: A Little Help needed standing up from a chair using your arms (e.g., wheelchair or bedside chair)?: A Little Help needed to walk in hospital room?: A Little Help needed climbing 3-5 steps with a railing? : A Lot 6 Click Score: 19    End of Session Equipment Utilized During Treatment: Gait belt Activity Tolerance: Patient tolerated treatment well Patient left: in chair;with call bell/phone within reach;with chair alarm set;with family/visitor present Nurse Communication: Mobility status PT Visit Diagnosis: Unsteadiness on feet (R26.81);Other abnormalities of gait and mobility (R26.89);Muscle weakness (generalized) (M62.81);Pain Pain - Right/Left: Left Pain - part of body: Hip;Leg    Time: 9084-9065 PT Time Calculation (min) (ACUTE ONLY): 19 min   Charges:   PT Evaluation $PT Eval Low Complexity: 1 Low   PT General Charges $$ ACUTE PT VISIT: 1 Visit  [place], PT Acute Rehab   [place] VEAR Drone 12/12/2023, 11:22 AM

## 2023-12-12 NOTE — Plan of Care (Signed)
 Pt had sudden pain in the middle of the night and needed an extra dose of dilaudid  that totaled 1mg  before pain subsided. Pt now resting comfortably. Problem: Education: Goal: Knowledge of General Education information will improve Description: Including pain rating scale, medication(s)/side effects and non-pharmacologic comfort measures Outcome: Progressing   Problem: Health Behavior/Discharge Planning: Goal: Ability to manage health-related needs will improve Outcome: Progressing   Problem: Clinical Measurements: Goal: Ability to maintain clinical measurements within normal limits will improve Outcome: Progressing Goal: Will remain free from infection Outcome: Progressing Goal: Diagnostic test results will improve Outcome: Progressing Goal: Respiratory complications will improve Outcome: Progressing Goal: Cardiovascular complication will be avoided Outcome: Progressing   Problem: Activity: Goal: Risk for activity intolerance will decrease Outcome: Progressing   Problem: Nutrition: Goal: Adequate nutrition will be maintained Outcome: Progressing   Problem: Coping: Goal: Level of anxiety will decrease Outcome: Progressing   Problem: Elimination: Goal: Will not experience complications related to bowel motility Outcome: Progressing Goal: Will not experience complications related to urinary retention Outcome: Progressing   Problem: Pain Managment: Goal: General experience of comfort will improve and/or be controlled Outcome: Progressing   Problem: Safety: Goal: Ability to remain free from injury will improve Outcome: Progressing   Problem: Skin Integrity: Goal: Risk for impaired skin integrity will decrease Outcome: Progressing

## 2023-12-13 ENCOUNTER — Inpatient Hospital Stay (HOSPITAL_COMMUNITY)

## 2023-12-13 ENCOUNTER — Encounter (HOSPITAL_COMMUNITY)

## 2023-12-13 DIAGNOSIS — T148XXA Other injury of unspecified body region, initial encounter: Secondary | ICD-10-CM | POA: Diagnosis not present

## 2023-12-13 DIAGNOSIS — R609 Edema, unspecified: Secondary | ICD-10-CM

## 2023-12-13 LAB — CBC WITH DIFFERENTIAL/PLATELET
Abs Immature Granulocytes: 0.12 K/uL — ABNORMAL HIGH (ref 0.00–0.07)
Basophils Absolute: 0.1 K/uL (ref 0.0–0.1)
Basophils Relative: 1 %
Eosinophils Absolute: 0.2 K/uL (ref 0.0–0.5)
Eosinophils Relative: 1 %
HCT: 26.7 % — ABNORMAL LOW (ref 36.0–46.0)
Hemoglobin: 8.7 g/dL — ABNORMAL LOW (ref 12.0–15.0)
Immature Granulocytes: 1 %
Lymphocytes Relative: 26 %
Lymphs Abs: 4.5 K/uL — ABNORMAL HIGH (ref 0.7–4.0)
MCH: 31.6 pg (ref 26.0–34.0)
MCHC: 32.6 g/dL (ref 30.0–36.0)
MCV: 97.1 fL (ref 80.0–100.0)
Monocytes Absolute: 1.3 K/uL — ABNORMAL HIGH (ref 0.1–1.0)
Monocytes Relative: 7 %
Neutro Abs: 11.1 K/uL — ABNORMAL HIGH (ref 1.7–7.7)
Neutrophils Relative %: 64 %
Platelets: 734 K/uL — ABNORMAL HIGH (ref 150–400)
RBC: 2.75 MIL/uL — ABNORMAL LOW (ref 3.87–5.11)
RDW: 15 % (ref 11.5–15.5)
WBC: 17.3 K/uL — ABNORMAL HIGH (ref 4.0–10.5)
nRBC: 0 % (ref 0.0–0.2)

## 2023-12-13 NOTE — Progress Notes (Signed)
 Physical Therapy Treatment Patient Details Name: [name] MRN: [id number] DOB: 1955/09/29 Today's Date: 12/13/2023   History of Present Illness [name] is a 68 year old female patient with medical history significant for thrombocytosis.  She had a bone marrow biopsy done on 12/10/2023 as an outpatient.  Patient returned to ED same day complaining of lower back pain radiating down her left leg and nausea and vomiting.  Follows with outpatient hematology/Dr. Loretha.  Pt admitted with retroperitoneal and pelvic hematoma.    PT Comments  Pt very eager to ambulate outside of room. Pt moving slowly but not requiring assistive device and denies pain (premedicated) today.  Pt reports awaiting a test/procedure and then hopeful for discharging home today.  Pt provided with fresh ice packs per her request.      If plan is discharge home, recommend the following: A little help with walking and/or transfers;A little help with bathing/dressing/bathroom;Assistance with cooking/housework;Assist for transportation;Help with stairs or ramp for entrance   Can travel by private vehicle        Equipment Recommendations  None recommended by PT    Recommendations for Other Services       Precautions / Restrictions Precautions Precautions: Fall     Mobility  Bed Mobility               General bed mobility comments: pt in recliner    Transfers                   General transfer comment: appears mod I in room (just requiring increased time)    Ambulation/Gait Ambulation/Gait assistance: Supervision Gait Distance (Feet): 200 Feet Assistive device: None Gait Pattern/deviations: Step-through pattern, Decreased stride length Gait velocity: decreased     General Gait Details: pt reports legs feeling heavy but no pain; reports feeling stable without assistive device just slow   Stairs             Wheelchair Mobility     Tilt Bed    Modified Rankin (Stroke  Patients Only)       Balance                                            Communication Communication Communication: No apparent difficulties  Cognition Arousal: Alert Behavior During Therapy: WFL for tasks assessed/performed   PT - Cognitive impairments: No apparent impairments                         Following commands: Intact      Cueing    Exercises      General Comments        Pertinent Vitals/Pain Pain Assessment Pain Assessment: No/denies pain Pain Intervention(s): Monitored during session, Premedicated before session, Repositioned, Ice applied    Home Living                          Prior Function            PT Goals (current goals can now be found in the care plan section) Progress towards PT goals: Progressing toward goals    Frequency    Min 3X/week      PT Plan      Co-evaluation              AM-PAC PT 6 Clicks Mobility  Outcome Measure  Help needed turning from your back to your side while in a flat bed without using bedrails?: None Help needed moving from lying on your back to sitting on the side of a flat bed without using bedrails?: None Help needed moving to and from a bed to a chair (including a wheelchair)?: None Help needed standing up from a chair using your arms (e.g., wheelchair or bedside chair)?: A Little Help needed to walk in hospital room?: A Little Help needed climbing 3-5 steps with a railing? : A Little 6 Click Score: 21    End of Session   Activity Tolerance: Patient tolerated treatment well Patient left: with call bell/phone within reach;in chair Nurse Communication: Mobility status PT Visit Diagnosis: Difficulty in walking, not elsewhere classified (R26.2)     Time: 8565-8551 PT Time Calculation (min) (ACUTE ONLY): 14 min  Charges:    $Gait Training: 8-22 mins PT General Charges $$ ACUTE PT VISIT: 1 Visit                     Tari KLEIN, DPT Physical  Therapist Acute Rehabilitation Services Office: [phone]    Tari CROME [place] 12/13/2023, 4:40 PM

## 2023-12-13 NOTE — Progress Notes (Incomplete)
{  Select Note:[id number]} 

## 2023-12-13 NOTE — Progress Notes (Signed)
 PROGRESS NOTE    [name]  FMW:[id number] DOB: [birth year] DOA: 12/10/2023 PCP: Merilee, L.Addie, MD (Inactive)   Brief Narrative:  [name] is a 68 y.o. female with medical history significant of neuropathic pain, arthritis, generalized anxiety disorder, essential hypertension, GERD, and chronic pain syndrome presented to emergency complaining of back pain radiating down to the left leg as well as some weakness of the left leg after she had a bone marrow biopsy same day.  Workup indicated acute blood loss anemia secondary to left psoas intramuscular hematoma and retroperitoneal hemorrhage.  She was admitted under hospital service for observation.    Assessment & Plan:   Principal Problem:   Intramuscular hematoma Active Problems:   Retroperitoneal hematoma   AKI (acute kidney injury) (HCC)   Thrombocytosis   Generalized anxiety disorder   Neuropathic pain   Essential hypertension   GERD (gastroesophageal reflux disease)   Chronic pain syndrome   Retroperitoneal bleeding  Acute blood loss anemia secondary to left psoas intramuscular hematoma / Retroperitoneal hematoma, POA: Patient underwent bone marrow biopsy from left iliac crest on the same day of admission.  CT lumbar spine and CT abdomen pelvis showed left psoas muscle hematoma and stable left retroperitoneal hematoma extending to the left pelvis,space of Retzius, and inguinal region.  There is no active extravasation.  Patient was hemodynamically stable upon arrival.  Case was discussed with general surgery and since there was no active bleeding, there were no further recommendations for intervention.  Oncology saw patient.  Patient's hemoglobin is slightly lower but is still reasonably stable.  Patient's pain much better controlled.  She is willing to go home however now she has left lower extremity edema.  With history of thrombosis, unable to give her chemical DVT prophylaxis and new left lower extremity edema, raises  concern for DVT.  Doppler ultrasound ordered at 9 AM stat.  Monitor closely.  Reactive leukocytosis Thrombocytosis - Elevated WBC count 16.4 and elevated platelet count 685.SABRA  Reactive leukocytosis in the setting of acute illness.  Patient is afebrile.   -Patient is currently undergoing outpatient workup for thrombocytosis.  In the setting of intravascular and retroperitoneal hematoma will defer any antiplatelet therapy.   Acute kidney injury Baseline creatinine appears to be around 0.6/0.7, presented with elevated creatinine 1.04.  Receiving hydration, improving creatinine down to 0.8.  Continue fluids.  Continue to avoid nephrotoxic agents.     Neuropathic pain Chronic pain syndrome - Continue home Norco and gabapentin .   Essential hypertension Holding home blood pressure regimen in the setting of hypotension and AKI.   Generalized anxiety disorder -Continue Prozac  and as needed Xanax .    DVT prophylaxis: SCDs Start: 12/10/23 2345 Place TED hose Start: 12/10/23 2345   Code Status: Full Code  Family Communication: None present at bedside.  Plan of care discussed with patient at the bedside in length and he/she verbalized understanding and agreed with it.  Status is: Inpatient Remains inpatient appropriate because: Needs IVC filter  Estimated body mass index is 31.79 kg/m as calculated from the following:   Height as of this encounter: 5' 4 (1.626 m).   Weight as of this encounter: 84 kg.    Nutritional Assessment: Body mass index is 31.79 kg/m.SABRA Seen by dietician.  I agree with the assessment and plan as outlined below: Nutrition Status:        . Skin Assessment: I have examined the patient's skin and I agree with the wound assessment as performed by the wound  care RN as outlined below:    Consultants:  Oncology  Procedures:  Above  Antimicrobials:  Anti-infectives (From admission, onward)    None         Subjective: Seen and examined, her pain is  better controlled.  She now feels comfortable going home however she is now complaining of left lower extremity edema which raises concern for DVT.  She understands that we need to do the ultrasound and make sure there is no DVT, before we can consider discharge.  Objective: Vitals:   12/12/23 1137 12/12/23 1918 12/13/23 0453 12/13/23 1255  BP: (!) 119/59 118/77 (!) 132/51 (!) 124/55  Pulse: (!) 101 (!) 101 93 (!) 103  Resp:  18 20 18   Temp: 97.8 F (36.6 C) 98.1 F (36.7 C) 97.6 F (36.4 C)   TempSrc:      SpO2: 98% 98% 100% 93%  Weight:      Height:        Intake/Output Summary (Last 24 hours) at 12/13/2023 1600 Last data filed at 12/12/2023 1851 Gross per 24 hour  Intake 240 ml  Output --  Net 240 ml   Filed Weights   12/11/23 0942  Weight: 84 kg    Examination:  General exam: Appears anxious Respiratory system: Clear to auscultation. Respiratory effort normal. Cardiovascular system: S1 & S2 heard, RRR. No JVD, murmurs, rubs, gallops or clicks.  Left lower extremity edema Gastrointestinal system: Abdomen is nondistended, soft and nontender. No organomegaly or masses felt. Normal bowel sounds heard. Central nervous system: Alert and oriented. No focal neurological deficits. Extremities: Symmetric 5 x 5 power. Skin: No rashes, lesions or ulcers.  Psychiatry:  Mood anxious  Data Reviewed: I have personally reviewed following labs and imaging studies  CBC: Recent Labs  Lab 12/10/23 0834 12/10/23 0834 12/10/23 1946 12/11/23 0508 12/11/23 1338 12/11/23 1655 12/11/23 1904 12/12/23 0530 12/13/23 0552  WBC 8.7  --  16.4* 14.9*  --  13.3*  --  13.9* 17.3*  NEUTROABS 5.4  --  14.3*  --   --  9.0*  --  10.8* 11.1*  HGB 12.6   < > 12.2 10.5* 10.5* 10.6* 10.9* 9.8* 8.7*  HCT 36.8  --  37.5 31.8* 31.3* 32.5* 32.7* 30.8* 26.7*  MCV 90.6  --  95.4 96.4  --  95.0  --  96.9 97.1  PLT 600*  --  685* 616*  --  643*  --  569* 734*   < > = values in this interval not displayed.    Basic Metabolic Panel: Recent Labs  Lab 12/10/23 1946 12/11/23 0508  NA 135 135  K 4.3 3.8  CL 99 100  CO2 23 27  GLUCOSE 145* 96  BUN 29* 22  CREATININE 1.04* 0.86  CALCIUM 8.9 8.6*   GFR: Estimated Creatinine Clearance: 65.6 mL/min (by C-G formula based on SCr of 0.86 mg/dL). Liver Function Tests: Recent Labs  Lab 12/10/23 1946  AST 21  ALT 17  ALKPHOS 65  BILITOT 0.8  PROT 7.2  ALBUMIN 4.0   Recent Labs  Lab 12/10/23 1946  LIPASE 41   No results for input(s): AMMONIA in the last 168 hours. Coagulation Profile: No results for input(s): INR, PROTIME in the last 168 hours. Cardiac Enzymes: No results for input(s): CKTOTAL, CKMB, CKMBINDEX, TROPONINI in the last 168 hours. BNP (last 3 results) No results for input(s): PROBNP in the last 8760 hours. HbA1C: No results for input(s): HGBA1C in the last 72 hours. CBG: No  results for input(s): GLUCAP in the last 168 hours. Lipid Profile: No results for input(s): CHOL, HDL, LDLCALC, TRIG, CHOLHDL, LDLDIRECT in the last 72 hours. Thyroid Function Tests: No results for input(s): TSH, T4TOTAL, FREET4, T3FREE, THYROIDAB in the last 72 hours. Anemia Panel: No results for input(s): VITAMINB12, FOLATE, FERRITIN, TIBC, IRON, RETICCTPCT in the last 72 hours. Sepsis Labs: No results for input(s): PROCALCITON, LATICACIDVEN in the last 168 hours.  No results found for this or any previous visit (from the past 240 hours).   Radiology Studies: No results found.   Scheduled Meds:  ferrous sulfate   324 mg Oral Q breakfast   FLUoxetine   20 mg Oral q morning   gabapentin   300 mg Oral TID   lidocaine   2 patch Transdermal Q24H   pantoprazole   40 mg Oral Daily   pneumococcal 20-valent conjugate vaccine  0.5 mL Intramuscular Tomorrow-1000   senna-docusate  1 tablet Oral BID   sodium chloride  flush  3 mL Intravenous Q12H   Continuous Infusions:     LOS: 2 days    Fredia Skeeter, MD [facility]  12/13/2023, 4:00 PM   *Please note that this is a verbal dictation therefore any spelling or grammatical errors are due to the Dragon Medical One system interpretation.  Please page via Amion and do not message via secure chat for urgent patient care matters. Secure chat can be used for non urgent patient care matters.  How to contact the [facility] Attending or Consulting provider 7A - 7P or covering provider during after hours 7P -7A, for this patient?  Check the care team in [facility] and look for a) attending/consulting [facility] provider listed and b) the [facility] team listed. Page or secure chat 7A-7P. Log into [email or web address] and use [facility]'s universal password to access. If you do not have the password, please contact the hospital operator. Locate the [facility] provider you are looking for under [facility] and page to a number that you can be directly reached. If you still have difficulty reaching the provider, please page the [facility] (Director on Call) for the Hospitalists listed on amion for assistance.

## 2023-12-13 NOTE — Plan of Care (Signed)

## 2023-12-13 NOTE — Discharge Summary (Incomplete)
 Physician Discharge Summary   Patient: [name] MRN: [id number] DOB: 03-16-1956  Admit date:     12/10/2023  Discharge date: {dischdate:26783}  Discharge Physician: Fredia Skeeter   PCP: Merilee, L.Addie, MD (Inactive)   Recommendations at discharge:  {Tip this will not be part of the note when signed- Example include specific recommendations for outpatient follow-up, pending tests to follow-up on. (Optional):26781}  ***  Discharge Diagnoses: Principal Problem:   Intramuscular hematoma Active Problems:   Retroperitoneal hematoma   AKI (acute kidney injury) (HCC)   Thrombocytosis   Generalized anxiety disorder   Neuropathic pain   Essential hypertension   GERD (gastroesophageal reflux disease)   Chronic pain syndrome   Retroperitoneal bleeding  Resolved Problems:   Retroperitoneal bleeding  Hospital Course: No notes on file  Assessment and Plan: No notes have been filed under this hospital service. Service: Hospitalist     {Tip this will not be part of the note when signed Body mass index is 31.79 kg/m. , ,  (Optional):26781}  {(NOTE) Pain control PDMP Statment (Optional):26782} Consultants: *** Procedures performed: ***  Disposition: {Plan; Disposition:26390} Diet recommendation:  {Diet_Plan:26776} DISCHARGE MEDICATION: Allergies as of 12/13/2023       Reactions   Morphine And Codeine Itching   Povidone Rash   Patient states she had mild rash to povidone about 50 years ago after child birth.  No other known allergic reaction since.         Medication List     TAKE these medications    ALPRAZolam  0.25 MG tablet Commonly known as: XANAX  Take 0.25 mg by mouth as needed. anxiety   ferrous sulfate  324 MG Tbec Take 324 mg by mouth every other day.   FLUoxetine  20 MG capsule Commonly known as: PROZAC  Take 20 mg by mouth every evening.   gabapentin  300 MG capsule Commonly known as: NEURONTIN  Take 1 capsule by mouth three times daily as  needed What changed: See the new instructions.   HYDROcodone -acetaminophen  5-325 MG tablet Commonly known as: NORCO/VICODIN Take 1 tablet by mouth every 6 (six) hours as needed for moderate pain (pain score 4-6).   KRILL OIL PO Take 1 capsule by mouth daily.   losartan 50 MG tablet Commonly known as: COZAAR Take 50 mg by mouth every evening.   pantoprazole  40 MG tablet Commonly known as: PROTONIX  Take 40 mg by mouth daily.   PROBIOTIC DAILY PO Take 1 capsule by mouth daily.   promethazine  25 MG tablet Commonly known as: PHENERGAN  Take 12.5-25 mg by mouth every 6 (six) hours as needed for nausea or vomiting.        Follow-up Information     Merilee, L.Addie, MD Follow up in 1 week(s).   Specialty: Family Medicine Contact information: [address] [place] [address] [phone]                Discharge Exam: Fredricka Weights   12/11/23 0942  Weight: 84 kg   ***  Condition at discharge: {DC Condition:26389}  The results of significant diagnostics from this hospitalization (including imaging, microbiology, ancillary and laboratory) are listed below for reference.   Imaging Studies: CT ABDOMEN PELVIS W CONTRAST Result Date: 12/10/2023 CLINICAL DATA:  Abdominal trauma, penetrating EXAM: CT ABDOMEN AND PELVIS WITH CONTRAST TECHNIQUE: Multidetector CT imaging of the abdomen and pelvis was performed using the standard protocol following bolus administration of intravenous contrast. RADIATION DOSE REDUCTION: This exam was performed according to the departmental dose-optimization program which includes  automated exposure control, adjustment of the mA and/or kV according to patient size and/or use of iterative reconstruction technique. CONTRAST:  OMNIPAQUE  IOHEXOL  300 MG/ML  SOLN COMPARISON:  CT lumbar spine 12/10/2023 FINDINGS: Lower chest: No acute abnormality.  Coronary artery calcification. Hepatobiliary: Not enlarged. No focal lesion. No  laceration or subcapsular hematoma. The gallbladder is otherwise unremarkable with no radio-opaque gallstones. No biliary ductal dilatation. Pancreas: Normal pancreatic contour. No main pancreatic duct dilatation. Spleen: Not enlarged. No focal lesion. No laceration, subcapsular hematoma, or vascular injury. Adrenals/Urinary Tract: No nodularity bilaterally. Bilateral kidneys enhance symmetrically. No hydronephrosis. No contusion, laceration, or subcapsular hematoma. No injury to the vascular structures or collecting systems. No hydroureter. 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 Otherwise the urinary bladder is unremarkable. Stomach/Bowel: No small or large bowel wall thickening or dilatation. Colonic diverticulosis. The appendix is unremarkable. Vasculature/Lymphatics: No abdominal aorta or iliac aneurysm. No active contrast extravasation or pseudoaneurysm. No abdominal, pelvic, inguinal lymphadenopathy. Reproductive: Normal. Other: Stable left retroperitoneal and left psoas intramuscular hematoma extending to the left pelvis, space of Retzius, and inguinal region. No definite associated active hemorrhage with limited evaluation due to timing of contrast. No simple free fluid ascites. No pneumoperitoneum. No organized fluid collection. Musculoskeletal: No significant soft tissue hematoma. No acute pelvic fracture. No spinal fracture. Grade 1 anterolisthesis of L5 on S1. Left sacral ala biopsy site noted (2:56). Other ports and devices: None. IMPRESSION: 1. Stable left retroperitoneal and left psoas intramuscular hematoma extending to the left pelvis, space of Retzius, and inguinal region. No definite evidence of associated active extravasation. 2. Other imaging findings of potential clinical significance: Colonic diverticulosis with no acute diverticulitis. Aortic Atherosclerosis (ICD10-I70.0). Electronically Signed   By: Morgane  Naveau M.D.   On: 12/10/2023 23:17   CT Lumbar  Spine Wo Contrast Result Date: 12/10/2023 CLINICAL DATA:  Myelopathy, acute, lumbar spine Low back pain radiating down L leg, weakness L leg after bone marrow biopsy Pt bib family for from advice from cancer center pain after bone marrow biopsy. Lower back pain that radiates down groin the left leg. Nausea and vomiting as well. Issues with biopsy today had to attempt three times. EXAM: CT LUMBAR SPINE WITHOUT CONTRAST TECHNIQUE: Multidetector CT imaging of the lumbar spine was performed without intravenous contrast administration. Multiplanar CT image reconstructions were also generated. RADIATION DOSE REDUCTION: This exam was performed according to the departmental dose-optimization program which includes automated exposure control, adjustment of the mA and/or kV according to patient size and/or use of iterative reconstruction technique. COMPARISON:  None Available. FINDINGS: Segmentation: 5 lumbar type vertebrae. Alignment: Grade 1 anterolisthesis of L5 on S1. Vertebrae: Multilevel mild degenerative changes of the spine. No acute fracture or focal pathologic process. Paraspinal and other soft tissues: Negative. Disc levels: Intervertebral disc space maintained. Other: Atherosclerotic plaque. Asymmetrically enlarged left psoas muscle with associated retroperitoneal hematoma and fat stranding extending down to the pelvis and presacral space with as well as left pelvic sidewall. Partially visualized 6.2 x 3.8 cm left pelvic sidewall hematoma with markedly limited evaluation on this noncontrast study. Left sacral ala linear lucencies consistent with recent biopsy. IMPRESSION: 1. Left psoas muscle hematoma with associated at least moderate volume retroperitoneal and pelvic hematoma. Partially visualized 6.2 x 3.8 cm left pelvic sidewall hematoma with markedly limited evaluation on this noncontrast study. Recommend further evaluation with a trauma scan with intravenous contrast of the abdomen and pelvis including  delayed imaging of the abdomen and pelvis (please make  note in comments for CT tech to obtain delayed images of both the abdomen and pelvis). Finding consistent with traumatic injury in the setting of recent biopsy. 2. Left sacral ala linear lucencies consistent with recent biopsy. 3. No acute displaced fracture or traumatic listhesis of the lumbar spine. 4.  Aortic Atherosclerosis (ICD10-I70.0). Electronically Signed   By: Morgane  Naveau M.D.   On: 12/10/2023 21:37    Microbiology: Results for orders placed or performed in visit on 07/05/20  Novel Coronavirus, NAA ([facility])     Status: None   Collection Time: 07/05/20 10:02 AM   Specimen: Nasopharyngeal(NP) swabs in vial transport medium   Nasopharynge  Screenin  Result Value Ref Range Status   SARS-CoV-2, NAA Not Detected Not Detected Final    Comment: This nucleic acid amplification test was developed and its performance characteristics determined by [employer]. Nucleic acid amplification tests include RT-PCR and TMA. This test has not been FDA cleared or approved. This test has been authorized by FDA under an Emergency Use Authorization (EUA). This test is only authorized for the duration of time the declaration that circumstances exist justifying the authorization of the emergency use of in vitro diagnostic tests for detection of SARS-CoV-2 virus and/or diagnosis of COVID-19 infection under section 564(b)(1) of the Act, 21 U.S.C. 639aaa-6(a) (1), unless the authorization is terminated or revoked sooner. When diagnostic testing is negative, the possibility of a false negative result should be considered in the context of a patient's recent exposures and the presence of clinical signs and symptoms consistent with COVID-19. An individual without symptoms of COVID-19 and who is not shedding SARS-CoV-2 virus wo uld expect to have a negative (not detected) result in this assay.   SARS-COV-2, NAA 2 DAY TAT     Status: None    Collection Time: 07/05/20 10:02 AM   Nasopharynge  Screenin  Result Value Ref Range Status   SARS-CoV-2, NAA 2 DAY TAT Performed  Final    Labs: CBC: Recent Labs  Lab 12/10/23 0834 12/10/23 0834 12/10/23 1946 12/11/23 0508 12/11/23 1338 12/11/23 1655 12/11/23 1904 12/12/23 0530 12/13/23 0552  WBC 8.7  --  16.4* 14.9*  --  13.3*  --  13.9* 17.3*  NEUTROABS 5.4  --  14.3*  --   --  9.0*  --  10.8* 11.1*  HGB 12.6   < > 12.2 10.5* 10.5* 10.6* 10.9* 9.8* 8.7*  HCT 36.8  --  37.5 31.8* 31.3* 32.5* 32.7* 30.8* 26.7*  MCV 90.6  --  95.4 96.4  --  95.0  --  96.9 97.1  PLT 600*  --  685* 616*  --  643*  --  569* 734*   < > = values in this interval not displayed.   Basic Metabolic Panel: Recent Labs  Lab 12/10/23 1946 12/11/23 0508  NA 135 135  K 4.3 3.8  CL 99 100  CO2 23 27  GLUCOSE 145* 96  BUN 29* 22  CREATININE 1.04* 0.86  CALCIUM 8.9 8.6*   Liver Function Tests: Recent Labs  Lab 12/10/23 1946  AST 21  ALT 17  ALKPHOS 65  BILITOT 0.8  PROT 7.2  ALBUMIN 4.0   CBG: No results for input(s): GLUCAP in the last 168 hours.  Discharge time spent: {LESS THAN/GREATER UYJW:73611} 30 minutes.  Signed: Fredia Skeeter, MD [facility] 12/13/2023

## 2023-12-13 NOTE — Progress Notes (Signed)
 VASCULAR LAB    Bilateral lower extremity venous duplex has been performed.  See CV proc for preliminary results.   Hooria Gasparini, RVT 12/13/2023, 7:15 PM

## 2023-12-14 ENCOUNTER — Inpatient Hospital Stay (HOSPITAL_COMMUNITY)

## 2023-12-14 ENCOUNTER — Encounter (HOSPITAL_COMMUNITY): Payer: Self-pay | Admitting: Internal Medicine

## 2023-12-14 DIAGNOSIS — T148XXA Other injury of unspecified body region, initial encounter: Secondary | ICD-10-CM | POA: Diagnosis not present

## 2023-12-14 HISTORY — PX: IR ANGIOGRAM SELECTIVE EACH ADDITIONAL VESSEL: IMG667

## 2023-12-14 HISTORY — PX: IR IVC FILTER PLMT / S&I /IMG GUID/MOD SED: IMG701

## 2023-12-14 HISTORY — PX: IR ANGIOGRAM PELVIS SELECTIVE OR SUPRASELECTIVE: IMG661

## 2023-12-14 HISTORY — PX: IR ANGIOGRAM VISCERAL SELECTIVE: IMG657

## 2023-12-14 HISTORY — PX: IR EMBO ART  VEN HEMORR LYMPH EXTRAV  INC GUIDE ROADMAPPING: IMG5450

## 2023-12-14 HISTORY — PX: IR US GUIDE VASC ACCESS RIGHT: IMG2390

## 2023-12-14 LAB — CBC WITH DIFFERENTIAL/PLATELET
Abs Immature Granulocytes: 0.05 K/uL (ref 0.00–0.07)
Abs Immature Granulocytes: 0.06 K/uL (ref 0.00–0.07)
Basophils Absolute: 0.1 K/uL (ref 0.0–0.1)
Basophils Absolute: 0.1 K/uL (ref 0.0–0.1)
Basophils Relative: 1 %
Basophils Relative: 1 %
Eosinophils Absolute: 0.3 K/uL (ref 0.0–0.5)
Eosinophils Absolute: 0.3 K/uL (ref 0.0–0.5)
Eosinophils Relative: 3 %
Eosinophils Relative: 3 %
HCT: 23.9 % — ABNORMAL LOW (ref 36.0–46.0)
HCT: 22.5 % — ABNORMAL LOW (ref 36.0–46.0)
Hemoglobin: 7.7 g/dL — ABNORMAL LOW (ref 12.0–15.0)
Hemoglobin: 7.4 g/dL — ABNORMAL LOW (ref 12.0–15.0)
Immature Granulocytes: 0 %
Immature Granulocytes: 1 %
Lymphocytes Relative: 21 %
Lymphocytes Relative: 23 %
Lymphs Abs: 2.4 K/uL (ref 0.7–4.0)
Lymphs Abs: 2.4 K/uL (ref 0.7–4.0)
MCH: 31 pg (ref 26.0–34.0)
MCH: 31.5 pg (ref 26.0–34.0)
MCHC: 32.2 g/dL (ref 30.0–36.0)
MCHC: 32.9 g/dL (ref 30.0–36.0)
MCV: 96.4 fL (ref 80.0–100.0)
MCV: 95.7 fL (ref 80.0–100.0)
Monocytes Absolute: 0.9 K/uL (ref 0.1–1.0)
Monocytes Absolute: 0.6 K/uL (ref 0.1–1.0)
Monocytes Relative: 8 %
Monocytes Relative: 6 %
Neutro Abs: 7.8 K/uL — ABNORMAL HIGH (ref 1.7–7.7)
Neutro Abs: 7.1 K/uL (ref 1.7–7.7)
Neutrophils Relative %: 67 %
Neutrophils Relative %: 66 %
Platelets: 619 K/uL — ABNORMAL HIGH (ref 150–400)
Platelets: 625 K/uL — ABNORMAL HIGH (ref 150–400)
RBC: 2.48 MIL/uL — ABNORMAL LOW (ref 3.87–5.11)
RBC: 2.35 MIL/uL — ABNORMAL LOW (ref 3.87–5.11)
RDW: 14.6 % (ref 11.5–15.5)
RDW: 14.6 % (ref 11.5–15.5)
WBC: 11.5 K/uL — ABNORMAL HIGH (ref 4.0–10.5)
WBC: 10.4 K/uL (ref 4.0–10.5)
nRBC: 0 % (ref 0.0–0.2)
nRBC: 0 % (ref 0.0–0.2)

## 2023-12-14 MED ORDER — SODIUM CHLORIDE (PF) 0.9 % IJ SOLN
INTRAMUSCULAR | Status: AC
  Filled 2023-12-14: qty 50

## 2023-12-14 MED ORDER — FENTANYL CITRATE (PF) 100 MCG/2ML IJ SOLN
INTRAMUSCULAR | Status: AC
  Filled 2023-12-14: qty 2

## 2023-12-14 MED ORDER — LIDOCAINE-EPINEPHRINE 1 %-1:100000 IJ SOLN
20.0000 mL | Freq: Once | INTRAMUSCULAR | Status: AC
  Administered 2023-12-14: 15 mL via INTRADERMAL

## 2023-12-14 MED ORDER — LIDOCAINE-EPINEPHRINE 1 %-1:100000 IJ SOLN
INTRAMUSCULAR | Status: AC
Start: 2023-12-14 — End: 2023-12-14
  Filled 2023-12-14: qty 1

## 2023-12-14 MED ORDER — APIXABAN 5 MG PO TABS
10.0000 mg | ORAL_TABLET | Freq: Two times a day (BID) | ORAL | Status: DC
  Administered 2023-12-14: 10 mg via ORAL
  Filled 2023-12-14: qty 2

## 2023-12-14 MED ORDER — MIDAZOLAM HCL 2 MG/2ML IJ SOLN
INTRAMUSCULAR | Status: AC
  Filled 2023-12-14: qty 2

## 2023-12-14 MED ORDER — FENTANYL CITRATE (PF) 100 MCG/2ML IJ SOLN
INTRAMUSCULAR | Status: AC | PRN
  Administered 2023-12-14 (×5): 25 ug via INTRAVENOUS

## 2023-12-14 MED ORDER — APIXABAN 5 MG PO TABS: 5.0000 mg | ORAL_TABLET | Freq: Two times a day (BID) | ORAL | Status: DC

## 2023-12-14 MED ORDER — IOHEXOL 300 MG/ML  SOLN
100.0000 mL | Freq: Once | INTRAMUSCULAR | Status: AC | PRN
  Administered 2023-12-14: 40 mL via INTRA_ARTERIAL

## 2023-12-14 MED ORDER — IOHEXOL 300 MG/ML  SOLN
100.0000 mL | Freq: Once | INTRAMUSCULAR | Status: AC | PRN
  Administered 2023-12-14: 30 mL via INTRAVENOUS

## 2023-12-14 MED ORDER — IOHEXOL 350 MG/ML SOLN
75.0000 mL | Freq: Once | INTRAVENOUS | Status: AC | PRN
  Administered 2023-12-14: 75 mL via INTRAVENOUS

## 2023-12-14 MED ORDER — IOHEXOL 300 MG/ML  SOLN
100.0000 mL | Freq: Once | INTRAMUSCULAR | Status: AC | PRN
  Administered 2023-12-14: 100 mL via INTRAVENOUS

## 2023-12-14 NOTE — Sedation Documentation (Signed)
 Coiling X 6 size 2mm up to 4mm

## 2023-12-14 NOTE — Procedures (Signed)
 Interventional Radiology Procedure Note  Procedure: IVC filter placement and left internal iliac branch embolization  Complications: None  Estimated Blood Loss: < 10 mL  Findings: RIJ access for IVC filter placement.  RCFA access for left iliolumbar branch coil embolization.  Cordella DELENA Banner, MD

## 2023-12-14 NOTE — Progress Notes (Signed)
 PHARMACY - ANTICOAGULATION CONSULT NOTE  Pharmacy Consult for apixaban  Indication: DVT  Allergies  Allergen Reactions   Morphine And Codeine Itching   Povidone Rash    Patient states she had mild rash to povidone about 50 years ago after child birth.  No other known allergic reaction since.     Patient Measurements: Height: 5' 4 (162.6 cm) Weight: 84 kg (185 lb 3 oz) IBW/kg (Calculated) : 54.7 HEPARIN DW (KG): 73.1  Vital Signs: Temp: 98.1 F (36.7 C) (07/06 0557) BP: 110/52 (07/06 0557) Pulse Rate: 87 (07/06 0557)  Labs: Recent Labs    12/11/23 1655 12/11/23 1904 12/12/23 0530 12/13/23 0552  HGB 10.6* 10.9* 9.8* 8.7*  HCT 32.5* 32.7* 30.8* 26.7*  PLT 643*  --  569* 734*    Estimated Creatinine Clearance: 65.6 mL/min (by C-G formula based on SCr of 0.86 mg/dL).   Medical History: Past Medical History:  Diagnosis Date   Anxiety    Arthritis    Atrophic vaginitis    Depression    GERD (gastroesophageal reflux disease)    GERD (gastroesophageal reflux disease)    Hypertension    Osteoarthritis    Seasonal allergies    Sleep apnea     Medications:  Scheduled:   apixaban   10 mg Oral BID   Followed by   NOREEN ON 12/21/2023] apixaban   5 mg Oral BID   ferrous sulfate   324 mg Oral Q breakfast   FLUoxetine   20 mg Oral q morning   gabapentin   300 mg Oral TID   lidocaine   2 patch Transdermal Q24H   pantoprazole   40 mg Oral Daily   pneumococcal 20-valent conjugate vaccine  0.5 mL Intramuscular Tomorrow-1000   senna-docusate  1 tablet Oral BID   sodium chloride  flush  3 mL Intravenous Q12H   Infusions:   Assessment: 33 yoF admitted on 7/2 with left psoas intramuscular hematoma and retroperitoneal hematoma s/p left iliac crest bone marrow biopsy.  Doppler on 7/6 now shows acute LLE DVT. No prior to admission or inpatient anticoagulation.   CBC (7/5): Hgb down to 8.7, Plt elevated at 734  Goal of Therapy:  Monitor platelets by anticoagulation protocol:  Yes   Plan:  Apixaban  10mg  PO BID x7 days, followed by 5mg  PO BID Pharmacy to provide education prior to discharge Follow up s/s bleeding    Wanda Hasting PharmD, BCPS [facility] main pharmacy [phone] 12/14/2023 7:27 AM

## 2023-12-14 NOTE — TOC CM/SW Note (Signed)
 HH setup w/ [facility]. HH information added to AVS.

## 2023-12-14 NOTE — Progress Notes (Signed)
 PROGRESS NOTE    [name]  FMW:[id number] DOB: 02-07-1956 DOA: 12/10/2023 PCP: Merilee, L.Addie, MD (Inactive)   Brief Narrative:  [name] is a 68 y.o. female with medical history significant of neuropathic pain, arthritis, generalized anxiety disorder, essential hypertension, GERD, and chronic pain syndrome presented to emergency complaining of back pain radiating down to the left leg as well as some weakness of the left leg after she had a bone marrow biopsy same day.  Workup indicated acute blood loss anemia secondary to left psoas intramuscular hematoma and retroperitoneal hemorrhage.  She was admitted under hospital service for observation.  However hospital course complicated with left lower extremity DVT and now further drop in hemoglobin.  Assessment & Plan:   Principal Problem:   Intramuscular hematoma Active Problems:   Retroperitoneal hematoma   AKI (acute kidney injury) (HCC)   Thrombocytosis   Generalized anxiety disorder   Neuropathic pain   Essential hypertension   GERD (gastroesophageal reflux disease)   Chronic pain syndrome   Retroperitoneal bleeding  Acute blood loss anemia secondary to left psoas intramuscular hematoma / Retroperitoneal hematoma, POA: Patient underwent bone marrow biopsy from left iliac crest on the same day of admission.  CT lumbar spine and CT abdomen pelvis showed left psoas muscle hematoma and stable left retroperitoneal hematoma extending to the left pelvis,space of Retzius, and inguinal region.  There is no active extravasation.  Patient was hemodynamically stable upon arrival.  Case was discussed with general surgery and since there was no active bleeding, there were no further recommendations for intervention.  Oncology saw patient.  Patient's hemoglobin is slightly lower but is still reasonably stable.  Patient's pain is better controlled.  She was seen by PT OT they recommended home health.  Patient's pain had improved on  12/13/2023 and she was willing to go home however she had developed left lower extremity edema which raised concern for DVT.  Stat ultrasound lower extremity was ordered 9 AM 12/13/2023 but could not be completed until 7:30 PM.  She now has extensive left lower extremity DVT.  However she also has some drop in hemoglobin this morning.  This complicates the care.  Although her pain is better and clinically she appears better however her hemoglobin has dropped from 8.7 yesterday to 7.7 today so we decided to proceed with another CT abdomen and pelvis with contrast.  This shows extension of the retroperitoneal hematoma into the left psoas muscle and left pelvic sidewall and there are areas of increased hyperdensity within hematomas which is concerning for acute blood and active bleeding.  I discussed with the radiologist over the phone, they are unable to tell whether she is actively bleeding or not since this was not CT angiogram.  They recommended consulting IR before ordering CTA.  I then discussed with Dr. Jenna of IR again at 1:30 PM.  He had already reviewed patient's films.  Although he could also not be sure if there was any active bleed or not however based on some increased size of the retroperitoneal hematoma however over the course of the last 3 days, his suspicion for active bleed was also very low.  He mentioned that he would not do embolization as well as IVC filter on the same day.  So we decided to pursue repeat hemoglobin later today and if there will be any evidence of further drop, we will proceed with CTA and if there will be any active bleed, embolization will be prioritized.  Left lower  extremity acute and extensive DVT: Diagnosed late in the evening of 12/13/2023.  However due to drop in hemoglobin, anticoagulation is contraindicated.  Received 1 dose of Eliquis  this morning since there was no CBC resulted by that time.  Lengthy discussion with the patient and daughter at the bedside.  Daughter  works at primary care clinic.  Best option at the moment is IVC filter.  Patient struggles with the trust in the medical care that she is getting.  She is saying that she would like to consult with her regular doctors including primary care oncologist before making any decisions.  I have consulted IR and spoke to Dr. Jenna at 10:30 AM today.  Left lower extremity acute and extensive DVT: Diagnosed late in the evening of 12/13/2023.  However due to drop in hemoglobin, anticoagulation is contraindicated.  Received 1 dose of Eliquis  this morning since there was no CBC resulted by that time.  Lengthy discussion with the patient and daughter at the bedside.  Daughter works at primary care clinic.  Best option at the moment is IVC filter.  Patient struggles with the trust in the medical care that she is getting.  She is saying that she would like to consult with her regular doctors including primary care oncologist before making any decisions.  I have consulted IR and spoke to Dr. Jenna at 10:30 AM today.  Reactive leukocytosis Thrombocytosis - Elevated WBC count 16.4 and elevated platelet count 685.[name]  Reactive leukocytosis in the setting of acute illness.  Patient is afebrile.   -Patient is currently undergoing outpatient workup for thrombocytosis.  In the setting of intravascular and retroperitoneal hematoma will defer any antiplatelet therapy.   Acute kidney injury Baseline creatinine appears to be around 0.6/0.7, presented with elevated creatinine 1.04.  Receiving hydration, improving creatinine down to 0.8.  Continue fluids.  Continue to avoid nephrotoxic agents.     Neuropathic pain Chronic pain syndrome - Continue home Norco and gabapentin .   Essential hypertension Holding home blood pressure regimen in the setting of hypotension and AKI.   Generalized anxiety disorder -Continue Prozac  and as needed Xanax .    DVT prophylaxis: SCDs Start: 12/10/23 2345 Place TED hose Start: 12/10/23 2345    Code Status: Full Code  Family Communication: Daughter present at bedside.  Plan of care discussed with patient and her daughter at the bedside in length and he/she verbalized understanding and agreed with it.  Status is: Inpatient Remains inpatient appropriate because: Needs IVC filter  Estimated body mass index is 31.79 kg/m as calculated from the following:   Height as of this encounter: 5' 4 (1.626 m).   Weight as of this encounter: 84 kg.    Nutritional Assessment: Body mass index is 31.79 kg/m.[name] Seen by dietician.  I agree with the assessment and plan as outlined below: Nutrition Status:        . Skin Assessment: I have examined the patient's skin and I agree with the wound assessment as performed by the wound care RN as outlined below:    Consultants:  Oncology  Procedures:  Above  Antimicrobials:  Anti-infectives (From admission, onward)    None         Subjective: Patient seen and examined at the bedside.  Daughter at the bedside.  Patient upset because she believes that she is not being taking care of as she should be.  She is fully aware that ultrasound was ordered stat at 9 AM yesterday and is also aware that unfortunately I  do not have any control on however the departments work.  She is mainly upset because she believes that her hemoglobin dropped yesterday and in her opinion, it was significant enough that we should have done another CBC later in the afternoon.  However patient's clinical condition improved, her pain had improved and the drop in hemoglobin was not clinically significant enough to warrant another CBC.  Regardless, repeat CBC this morning shows only 1 g drop compared to yesterday morning/within 24 hours.  Still there is no further change in management and there is no indication of transfusion.  Despite of explaining the rationale of what we are doing and why we are doing, she appeared to have no trust in the medical team for what we are  doing.  I then went to see her again at 2 PM with the nurse Aureliano with me.  I informed her about my conversations with the IR and what exactly the plan is.  Her daughter was also on the speaker phone so she was in the loop as well.  This time she said that her pain was slightly worse and now she is taking 2 tablets of Norco.  She had no further questions.  Objective: Vitals:   12/13/23 1255 12/13/23 2200 12/14/23 0557 12/14/23 1336  BP: (!) 124/55 (!) 140/58 (!) 110/52 (!) 124/47  Pulse: (!) 103 98 87 92  Resp: 18 20 18 16   Temp: 97.7 F (36.5 C) 99.1 F (37.3 C) 98.1 F (36.7 C) 98.4 F (36.9 C)  TempSrc:      SpO2: 93% 95% 92% 92%  Weight:      Height:        Intake/Output Summary (Last 24 hours) at 12/14/2023 1346 Last data filed at 12/13/2023 1700 Gross per 24 hour  Intake 180 ml  Output --  Net 180 ml   Filed Weights   12/11/23 0942  Weight: 84 kg    Examination:  General exam: Appears calm and comfortable  Respiratory system: Clear to auscultation. Respiratory effort normal. Cardiovascular system: S1 & S2 heard, RRR. No JVD, murmurs, rubs, gallops or clicks.  Left lower extremity edema. Gastrointestinal system: Abdomen is nondistended, soft and nontender. No organomegaly or masses felt. Normal bowel sounds heard. Central nervous system: Alert and oriented. No focal neurological deficits. Extremities: Symmetric 5 x 5 power. Skin: No rashes, lesions or ulcers.  Psychiatry: Judgement and insight appear normal. Mood & affect appropriate.   Data Reviewed: I have personally reviewed following labs and imaging studies  CBC: Recent Labs  Lab 12/10/23 1946 12/11/23 0508 12/11/23 1338 12/11/23 1655 12/11/23 1904 12/12/23 0530 12/13/23 0552 12/14/23 0733  WBC 16.4* 14.9*  --  13.3*  --  13.9* 17.3* 11.5*  NEUTROABS 14.3*  --   --  9.0*  --  10.8* 11.1* 7.8*  HGB 12.2 10.5*   < > 10.6* 10.9* 9.8* 8.7* 7.7*  HCT 37.5 31.8*   < > 32.5* 32.7* 30.8* 26.7* 23.9*  MCV 95.4  96.4  --  95.0  --  96.9 97.1 96.4  PLT 685* 616*  --  643*  --  569* 734* 619*   < > = values in this interval not displayed.   Basic Metabolic Panel: Recent Labs  Lab 12/10/23 1946 12/11/23 0508  NA 135 135  K 4.3 3.8  CL 99 100  CO2 23 27  GLUCOSE 145* 96  BUN 29* 22  CREATININE 1.04* 0.86  CALCIUM 8.9 8.6*   GFR: Estimated Creatinine Clearance:  65.6 mL/min (by C-G formula based on SCr of 0.86 mg/dL). Liver Function Tests: Recent Labs  Lab 12/10/23 1946  AST 21  ALT 17  ALKPHOS 65  BILITOT 0.8  PROT 7.2  ALBUMIN 4.0   Recent Labs  Lab 12/10/23 1946  LIPASE 41   No results for input(s): AMMONIA in the last 168 hours. Coagulation Profile: No results for input(s): INR, PROTIME in the last 168 hours. Cardiac Enzymes: No results for input(s): CKTOTAL, CKMB, CKMBINDEX, TROPONINI in the last 168 hours. BNP (last 3 results) No results for input(s): PROBNP in the last 8760 hours. HbA1C: No results for input(s): HGBA1C in the last 72 hours. CBG: No results for input(s): GLUCAP in the last 168 hours. Lipid Profile: No results for input(s): CHOL, HDL, LDLCALC, TRIG, CHOLHDL, LDLDIRECT in the last 72 hours. Thyroid Function Tests: No results for input(s): TSH, T4TOTAL, FREET4, T3FREE, THYROIDAB in the last 72 hours. Anemia Panel: No results for input(s): VITAMINB12, FOLATE, FERRITIN, TIBC, IRON, RETICCTPCT in the last 72 hours. Sepsis Labs: No results for input(s): PROCALCITON, LATICACIDVEN in the last 168 hours.  No results found for this or any previous visit (from the past 240 hours).   Radiology Studies: CT ABDOMEN PELVIS W CONTRAST Result Date: 12/14/2023 CLINICAL DATA:  Evaluate retroperitoneal bleed. EXAM: CT ABDOMEN AND PELVIS WITH CONTRAST TECHNIQUE: Multidetector CT imaging of the abdomen and pelvis was performed using the standard protocol following bolus administration of intravenous contrast.  RADIATION DOSE REDUCTION: This exam was performed according to the departmental dose-optimization program which includes automated exposure control, adjustment of the mA and/or kV according to patient size and/or use of iterative reconstruction technique. CONTRAST:  OMNIPAQUE  IOHEXOL  300 MG/ML  SOLN COMPARISON:  12/10/2023 FINDINGS: Lower chest: Subsegmental atelectasis noted in both lung bases. No pleural effusion or consolidative change. Hepatobiliary: No focal liver abnormality is seen. No gallstones, gallbladder wall thickening, or biliary dilatation. Pancreas: Unremarkable. No pancreatic ductal dilatation or surrounding inflammatory changes. Spleen: Normal in size without focal abnormality. Adrenals/Urinary Tract: Normal adrenal glands. No nephrolithiasis, hydronephrosis or suspicious mass. Circumferential wall thickening of the bladder. No focal bladder abnormality. Stomach/Bowel: Stomach appears within normal limits. No dilated loops of large or small bowel. No bowel wall thickening, inflammation or distension. Vascular/Lymphatic: Aortic atherosclerosis. No enlarged abdominal or pelvic lymph nodes. Reproductive: Status post hysterectomy. No adnexal masses. Other: Again seen are changes of left retroperitoneal hematoma with extension into the left psoas muscle and left pelvic sidewall. Persistent hyperdense components within this area measuring up to 74 Hounsfield units. Increased component is identified within the left lower quadrant anterior to the left psoas muscle which measures 9.3 x 6.1 cm, image 63/2. Similar appearance of the component involving the left psoas muscle and left extraperitoneal space along the left pelvic sidewall. Increased fluid within the presacral soft tissue space is also similar to previous exam. Sub optimal visualization of potential pseudoaneurysm due to timing of contrast bolus. Musculoskeletal: Left sacral wing biopsy tract is again noted, image 64/2. IMPRESSION: 1. Again  seen are changes of left retroperitoneal hematoma with extension into the left psoas muscle and left pelvic sidewall. There are areas of increased hyperdensity within the hematomas measuring up to 75 Hounsfield units which is concerning for acute blood and active bleeding. Increased component is identified within the left lower quadrant anterior to the asymmetrically enlarged left psoas muscle which measures 9.3 x 6.1 cm. Similar volume of the component involving the left psoas muscle and left extraperitoneal space along the  left pelvic sidewall. 2. Suboptimal visualization of potential pseudoaneurysm due to timing of contrast bolus. 3. Circumferential wall thickening of the bladder. Correlate for any clinical signs or symptoms of cystitis. 4.  Aortic Atherosclerosis (ICD10-I70.0). Electronically Signed   By: Waddell Calk M.D.   On: 12/14/2023 13:01   VAS US  LOWER EXTREMITY VENOUS (DVT) Result Date: 12/14/2023  Lower Venous DVT Study Patient Name:  RICARDO KAYES [facility]  Date of Exam:   12/13/2023 Medical Rec #: [id number]         Accession #:    [id number] Date of Birth: Sep 21, 1955          Patient Gender: F Patient Age:   52 years Exam Location:  [facility] Procedure:      VAS US  LOWER EXTREMITY VENOUS (DVT) Referring Phys: Abdiel Blackerby --------------------------------------------------------------------------------  Indications: Edema. Other Indications: Patient had bone marrow biopsy 12/10/2023 with ensuing lower                    back pain that radiated to groin and thigh. Risk Factors: Left psoas intramuscular hematoma with associated at least moderate volume retroperitoneal and pelvic hematoma. Limitations: Poor ultrasound/tissue interface and edema. Comparison Study: No prior study on file Performing Technologist: Alberta Lis RVS  Examination Guidelines: A complete evaluation includes B-mode imaging, spectral Doppler, color Doppler, and power Doppler as needed of all accessible portions of each vessel.  Bilateral testing is considered an integral part of a complete examination. Limited examinations for reoccurring indications may be performed as noted. The reflux portion of the exam is performed with the patient in reverse Trendelenburg.  +---------+---------------+---------+-----------+----------+--------------+ RIGHT    CompressibilityPhasicitySpontaneityPropertiesThrombus Aging +---------+---------------+---------+-----------+----------+--------------+ CFV      Full           Yes      Yes                                 +---------+---------------+---------+-----------+----------+--------------+ SFJ      Full                                                        +---------+---------------+---------+-----------+----------+--------------+ FV Prox  Full           Yes      Yes                                 +---------+---------------+---------+-----------+----------+--------------+ FV Mid   Full                                                        +---------+---------------+---------+-----------+----------+--------------+ FV DistalFull           Yes      Yes                                 +---------+---------------+---------+-----------+----------+--------------+ PFV      Full           Yes      Yes                                 +---------+---------------+---------+-----------+----------+--------------+  POP      Full           Yes      Yes                                 +---------+---------------+---------+-----------+----------+--------------+ PTV      Full                                                        +---------+---------------+---------+-----------+----------+--------------+ PERO     Full                                                        +---------+---------------+---------+-----------+----------+--------------+   +----------+---------------+---------+-----------+----------+--------------+ LEFT       CompressibilityPhasicitySpontaneityPropertiesThrombus Aging +----------+---------------+---------+-----------+----------+--------------+ CFV       None           No       No                   Acute          +----------+---------------+---------+-----------+----------+--------------+ SFJ       Partial        No       No                   Acute          +----------+---------------+---------+-----------+----------+--------------+ FV Prox   Partial        Yes      No                   Acute          +----------+---------------+---------+-----------+----------+--------------+ FV Mid    Full                                                        +----------+---------------+---------+-----------+----------+--------------+ FV Distal Full                                                        +----------+---------------+---------+-----------+----------+--------------+ PFV       Partial        No       No                   Acute          +----------+---------------+---------+-----------+----------+--------------+ POP       Full           Yes      No                                  +----------+---------------+---------+-----------+----------+--------------+ PTV       None  Acute          +----------+---------------+---------+-----------+----------+--------------+ PERO      None                                         Acute          +----------+---------------+---------+-----------+----------+--------------+ Gastroc   Full           No       No                                  +----------+---------------+---------+-----------+----------+--------------+ EIV                      No       No                   Acute          +----------+---------------+---------+-----------+----------+--------------+ CIV distal               No       No                   Acute           +----------+---------------+---------+-----------+----------+--------------+ Unable to visualize mid to distal common iliac vein. The IVC is patent.    Summary: RIGHT: - There is no evidence of deep vein thrombosis in the lower extremity.  - No cystic structure found in the popliteal fossa.  LEFT: - Findings consistent with acute deep vein thrombosis involving the left external iliac, distal common iliac, common femoral vein, SF junction, left femoral vein, left proximal profunda vein, left posterior tibial veins, and left peroneal veins.  - No cystic structure found in the popliteal fossa. - Unable to visualize mid to distal common iliac vein. The IVC is patent. Unable to place order for IVC/Iliac duplex, added scan to LEV study.  *See table(s) above for measurements and observations. Electronically signed by Lonni Gaskins MD on 12/14/2023 at 12:09:18 PM.    Final      Scheduled Meds:  ferrous sulfate   324 mg Oral Q breakfast   FLUoxetine   20 mg Oral q morning   gabapentin   300 mg Oral TID   lidocaine   2 patch Transdermal Q24H   pantoprazole   40 mg Oral Daily   pneumococcal 20-valent conjugate vaccine  0.5 mL Intramuscular Tomorrow-1000   senna-docusate  1 tablet Oral BID   sodium chloride  flush  3 mL Intravenous Q12H   Continuous Infusions:     LOS: 3 days   Fredia Skeeter, MD [facility] Total time spent so far on this patient's care is 80 minutes.  12/14/2023, 1:46 PM   *Please note that this is a verbal dictation therefore any spelling or grammatical errors are due to the Dragon Medical One system interpretation.  Please page via Amion and do not message via secure chat for urgent patient care matters. Secure chat can be used for non urgent patient care matters.  How to contact the [facility] Attending or Consulting provider 7A - 7P or covering provider during after hours 7P -7A, for this patient?  Check the care team in [facility] and look for a) attending/consulting [facility] provider listed  and b) the [facility] team listed. Page or secure chat 7A-7P. Log into [email or web address] and use [place]'s universal  password to access. If you do not have the password, please contact the hospital operator. Locate the [facility] provider you are looking for under [facility] and page to a number that you can be directly reached. If you still have difficulty reaching the provider, please page the [facility] (Director on Call) for the Hospitalists listed on amion for assistance.

## 2023-12-14 NOTE — Discharge Instructions (Addendum)
 SABRA

## 2023-12-15 ENCOUNTER — Other Ambulatory Visit (HOSPITAL_COMMUNITY): Payer: Self-pay

## 2023-12-15 DIAGNOSIS — T148XXA Other injury of unspecified body region, initial encounter: Secondary | ICD-10-CM | POA: Diagnosis not present

## 2023-12-15 DIAGNOSIS — I82402 Acute embolism and thrombosis of unspecified deep veins of left lower extremity: Secondary | ICD-10-CM | POA: Insufficient documentation

## 2023-12-15 LAB — CBC WITH DIFFERENTIAL/PLATELET
Abs Immature Granulocytes: 0.07 K/uL (ref 0.00–0.07)
Basophils Absolute: 0.1 K/uL (ref 0.0–0.1)
Basophils Relative: 1 %
Eosinophils Absolute: 0.3 K/uL (ref 0.0–0.5)
Eosinophils Relative: 3 %
HCT: 23.3 % — ABNORMAL LOW (ref 36.0–46.0)
Hemoglobin: 7.6 g/dL — ABNORMAL LOW (ref 12.0–15.0)
Immature Granulocytes: 1 %
Lymphocytes Relative: 24 %
Lymphs Abs: 2.5 K/uL (ref 0.7–4.0)
MCH: 31.8 pg (ref 26.0–34.0)
MCHC: 32.6 g/dL (ref 30.0–36.0)
MCV: 97.5 fL (ref 80.0–100.0)
Monocytes Absolute: 1 K/uL (ref 0.1–1.0)
Monocytes Relative: 9 %
Neutro Abs: 6.8 K/uL (ref 1.7–7.7)
Neutrophils Relative %: 62 %
Platelets: 653 K/uL — ABNORMAL HIGH (ref 150–400)
RBC: 2.39 MIL/uL — ABNORMAL LOW (ref 3.87–5.11)
RDW: 14.8 % (ref 11.5–15.5)
WBC: 10.7 K/uL — ABNORMAL HIGH (ref 4.0–10.5)
nRBC: 0 % (ref 0.0–0.2)

## 2023-12-15 LAB — BASIC METABOLIC PANEL WITH GFR
Anion gap: 11 (ref 5–15)
BUN: 15 mg/dL (ref 8–23)
CO2: 25 mmol/L (ref 22–32)
Calcium: 8.7 mg/dL — ABNORMAL LOW (ref 8.9–10.3)
Chloride: 100 mmol/L (ref 98–111)
Creatinine, Ser: 0.82 mg/dL (ref 0.44–1.00)
GFR, Estimated: >60 mL/min (ref >60)
Glucose, Bld: 93 mg/dL (ref 70–99)
Potassium: 4.3 mmol/L (ref 3.5–5.1)
Sodium: 136 mmol/L (ref 135–145)

## 2023-12-15 MED ORDER — HYDROCODONE-ACETAMINOPHEN 5-325 MG PO TABS
1.0000 | ORAL_TABLET | Freq: Four times a day (QID) | ORAL | 0 refills | Status: AC | PRN
  Filled 2023-12-15: qty 30, 4d supply, fill #0

## 2023-12-15 NOTE — Progress Notes (Signed)
 Patient ID: [name], female   DOB: 04/12/1956, 68 y.o.   MRN: [id number] Patient status post IVC filter placement and coiling of left ilial lumbar branch artery (originating from internal iliac artery) on 7/6 secondary to acute hemorrhage left pelvic sidewall and left lower extremity DVT.  Patient currently stable; preparing for discharge home today.  Hemoglobin 7.6 up from 7.4, creatinine normal.  Puncture sites right internal jugular vein/right common femoral artery soft, clean, dry, mildly tender, no obvious hematomas.  Intact distal pulses.  Post arteriogram discharge instructions reviewed with patient and spouse.  Patient will be scheduled for follow-up in IR clinic with Dr. Jenna in 3 months to discuss possible IVC filter retrieval.

## 2023-12-15 NOTE — Progress Notes (Signed)
   12/15/23 1105  TOC Brief Assessment  Insurance and Status Reviewed  Patient has primary care physician Yes  Home environment has been reviewed home w/ family  Prior level of function: independent  Prior/Current Home Services No current home services  Social Drivers of Health Review SDOH reviewed no interventions necessary  Readmission risk has been reviewed Yes  Transition of care needs no transition of care needs at this time

## 2023-12-15 NOTE — Plan of Care (Signed)
   Problem: Health Behavior/Discharge Planning: Goal: Ability to manage health-related needs will improve Outcome: Progressing

## 2023-12-15 NOTE — Discharge Summary (Signed)
 Physician Discharge Summary  [name] FMW:[id number] DOB: [birth year] DOA: 12/10/2023  PCP: Merilee, L.Addie, MD (Inactive)  Admit date: 12/10/2023 Discharge date: 12/15/2023 30 Day Unplanned Readmission Risk Score    Flowsheet Row ED to Hosp-Admission (Current) from 12/10/2023 in [facility]  30 Day Unplanned Readmission Risk Score (%) 7.61 Filed at 12/15/2023 0801    This score is the patient's risk of an unplanned readmission within 30 days of being discharged (0 -100%). The score is based on dignosis, age, lab data, medications, orders, and past utilization.   Low:  0-14.9   Medium: 15-21.9   High: 22-29.9   Extreme: 30 and above          Admitted From: Home Disposition: Home  Recommendations for Outpatient Follow-up:  Follow up with PCP in 1-2 weeks Please obtain BMP/CBC in 2 to 3 days. Please follow up with your PCP on the following pending results: Unresulted Labs (From admission, onward)     Start     Ordered   12/14/23 1600  CBC with Differential/Platelet  5A & 5P,   R (with TIMED occurrences)     Question:  Specimen collection method  Answer:  Lab=Lab collect   12/14/23 1343              Home Health: Yes Equipment/Devices: None  Discharge Condition: Stable CODE STATUS: Full code Diet recommendation: Cardiac  Subjective: Seen and examined, daughter at the bedside.  Patient says that she is feeling much better.  Her left flank pain has improved and down to 2 out of 10 and this was 2 out of 10 yesterday.  She feels very comfortable going home.  She was offered to stay a few more hours a week and repeat CBC and make sure her hemoglobin is stable however she strongly feels that she is ready to go home now and is requesting discharge now.  Daughter in agreement as well.  Brief/Interim Summary: [name] is a 68 y.o. female with medical history significant of neuropathic pain, arthritis, generalized anxiety disorder,  essential hypertension, GERD, and chronic pain syndrome presented to emergency complaining of back pain radiating down to the left leg as well as some weakness of the left leg after she had a bone marrow biopsy same day.  Workup indicated acute blood loss anemia secondary to left psoas intramuscular hematoma and retroperitoneal hemorrhage.  She was admitted under hospital service for observation.  However hospital course complicated with left lower extremity DVT and now further drop in hemoglobin.  Details below.   Acute blood loss anemia secondary to left psoas intramuscular hematoma / Retroperitoneal hematoma, POA: Patient underwent bone marrow biopsy from left iliac crest prior to presenting to ED. CT lumbar spine and CT abdomen pelvis showed left psoas muscle hematoma and stable left retroperitoneal hematoma extending to the left pelvis,space of Retzius, and inguinal region.  There is no active extravasation.  Patient was hemodynamically stable upon arrival.  Case was discussed with general surgery and since there was no active bleeding, there were no further recommendations for intervention.  Oncology saw patient.  Patient's hemoglobin remained stable for 2 days, patient's pain was worse, she was requiring more medications, she was seen by PT OT, they recommended home health. Although her pain improved and clinically she appeared better however her hemoglobin dropped from 8.7 yesterday to 7.7 yesterday so we decided to proceed with another CT abdomen and pelvis with contrast.  This showed extension of  the retroperitoneal hematoma into the left psoas muscle and left pelvic sidewall and there are areas of increased hyperdensity within hematomas which is concerning for acute blood and active bleeding.  I discussed with the radiologist over the phone, they were unable to tell whether she is actively bleeding or not since this was not CT angiogram.  They recommended consulting IR before ordering CTA.  I then  discussed with Dr. Jenna of IR again at 1:30 PM.  He had already reviewed patient's films.  he could also not be sure if there was any active bleed or not So we decided to pursue repeat hemoglobin later same day and to pursue CTA if there is further drop.  Unfortunately, her hemoglobin dropped a little bit from 7.7 to 7.4.  CTA stat was obtained which showed pseudoaneurysm in the left retroperitoneum arising from small branch of left internal iliac artery concerning for active bleed.  IR was consulted, patient underwent left internal iliac branch embolization along with IVC filter placement at the same time urgently.  Patient's hemoglobin has remained stable and her pain has improved as well.  She feels comfortable going home.  I have strongly advised that patient's CBC must be checked in 2 to 3 days.  Patient will make sure to get this done either via her PCP or her oncologist.  Current hemoglobin is 7.6.   Left lower extremity acute and extensive DVT: Diagnosed late in the evening of 12/13/2023.  She was given 1 dose of Eliquis , however her hemoglobin was found to be slightly lower which contraindicated anticoagulation so anticoagulation was discontinued, the best course of action here was to pursue IVC filter for which IR was consulted and patient underwent IVC filter placement late in the evening of 12/14/2023.  IR will follow-up with her as outpatient in 3 months for retrieval of the IVC filter.     Reactive leukocytosis Thrombocytosis - Elevated WBC count 16.4 and elevated platelet count [id number]  Reactive leukocytosis in the setting of acute illness.  Patient is afebrile.   -Patient is currently undergoing outpatient workup for thrombocytosis.  In the setting of intravascular and retroperitoneal hematoma will defer any antiplatelet therapy.   Acute kidney injury Baseline creatinine appears to be around 0.6/0.7, presented with elevated creatinine 1.04.  Receiving hydration, improving creatinine down to  0.8.     Neuropathic pain Chronic pain syndrome - Continue home Norco and gabapentin .  Due to pain, I prescribed her more Norco today.   Essential hypertension Resume home medications.   Generalized anxiety disorder -Continue Prozac  and as needed Xanax .  Discharge plan was discussed with patient and/or family member and they verbalized understanding and agreed with it.  Discharge Diagnoses:  Principal Problem:   Intramuscular hematoma Active Problems:   Retroperitoneal hematoma   AKI (acute kidney injury) (HCC)   Thrombocytosis   Generalized anxiety disorder   Neuropathic pain   Essential hypertension   GERD (gastroesophageal reflux disease)   Chronic pain syndrome   Retroperitoneal bleeding   Acute deep vein thrombosis (DVT) of left lower extremity (HCC)    Discharge Instructions   Allergies as of 12/15/2023       Reactions   Morphine And Codeine Itching   Povidone Rash   Patient states she had mild rash to povidone about 50 years ago after child birth.  No other known allergic reaction since.         Medication List     TAKE these medications    ALPRAZolam  0.25  MG tablet Commonly known as: XANAX  Take 0.25 mg by mouth as needed. anxiety   ferrous sulfate  324 MG Tbec Take 324 mg by mouth every other day.   FLUoxetine  20 MG capsule Commonly known as: PROZAC  Take 20 mg by mouth every evening.   gabapentin  300 MG capsule Commonly known as: NEURONTIN  Take 1 capsule by mouth three times daily as needed What changed: See the new instructions.   HYDROcodone -acetaminophen  5-325 MG tablet Commonly known as: NORCO/VICODIN Take 1-2 tablets by mouth every 6 (six) hours as needed for moderate pain (pain score 4-6). What changed: how much to take   KRILL OIL PO Take 1 capsule by mouth daily.   losartan 50 MG tablet Commonly known as: COZAAR Take 50 mg by mouth every evening.   pantoprazole  40 MG tablet Commonly known as: PROTONIX  Take 40 mg by mouth  daily.   PROBIOTIC DAILY PO Take 1 capsule by mouth daily.   promethazine  25 MG tablet Commonly known as: PHENERGAN  Take 12.5-25 mg by mouth every 6 (six) hours as needed for nausea or vomiting.        Follow-up Information     [facility] Follow up.   Specialty: Home Health Services Contact information: [address] [place] [address] [phone]         Merilee, L.Addie, MD Follow up in 1 week(s).   Specialty: Family Medicine Contact information: [address] [place] [address] [phone]                Allergies  Allergen Reactions   Morphine And Codeine Itching   Povidone Rash    Patient states she had mild rash to povidone about 50 years ago after child birth.  No other known allergic reaction since.     Consultations: IR and oncology   Procedures/Studies: IR IVC FILTER PLMT / S&I /IMG GUID/MOD SED Result Date: 12/14/2023 INDICATION: Acute hemorrhage in a patient with extensive left lower extremity DVT involving the external iliac vein, external iliac vein, femoral vein. With an inability to anticoagulate, an IVC filter was discussed as a temporary method a per venting pulmonary emboli. EXAM: IVC filter placement MEDICATIONS: None. ANESTHESIA/SEDATION: Moderate (conscious) sedation was employed during this procedure. A total of Versed  0 mg and Fentanyl  150 mcg was administered intravenously by the radiology nurse. Total intra-service moderate Sedation Time: 60 minutes. The patient's level of consciousness and vital signs were monitored continuously by radiology nursing throughout the procedure under my direct supervision. FLUOROSCOPY: Radiation Exposure Index (as provided by the fluoroscopic device): 346 mGy Kerma COMPLICATIONS: None immediate. PROCEDURE: Informed written consent was obtained from the patient after a thorough discussion of the procedural risks, benefits and alternatives. All questions were  addressed. Maximal Sterile Barrier Technique was utilized including caps, mask, sterile gowns, sterile gloves, sterile drape, hand hygiene and skin antiseptic. A timeout was performed prior to the initiation of the procedure. With patient in supine position, the right neck was prepped and draped in usual sterile fashion. Local anesthesia was achieved by infiltrating subcutaneous tissue overlying the right IJ with 1% lidocaine . Ultrasound demonstrated the right internal jugular vein to be anechoic and compressible indicating patency. The needle was advanced from the skin into the midpoint of the vessel under ultrasound guidance. Final image was obtained stored patient's permanent medical record. Access was exchanged for an 035 guidewire which was advanced under fluoroscopic guidance into the IVC. A 10 French sheath was then advanced over the  guidewire and into the inferior vena cava. The catheter and sheath were then advanced under fluoroscopic guidance to the iliac bifurcation. The introducer and guidewire were removed and an inferior vena cavagram was performed through the sheath. The IVC is widely patent and of a size amenable to filter placement. The left and right renal veins are well seen and marked. The [facility] filter was then advanced through the sheath and the sheath was partially retracted to a level inferior to the renal veins. The [facility] filter was then deployed as the sheath was retracted. A final venogram was performed demonstrating satisfactory position of the filter. The sheath was completely removed from the patient and hemostasis was achieved with direct compression. Sterile dressing applied. IMPRESSION: Satisfactory placement of a temporary Denali IVC filter. The patient will be followed up clinically in approximately 2-3 months for discussion of possible IVC filter retrieval. PLAN: This IVC filter is potentially retrievable. The patient will be assessed for filter retrieval by Interventional  Radiology in approximately 8-12 weeks. Further recommendations regarding filter retrieval, continued surveillance or declaration of device permanence, will be made at that time. Electronically Signed   By: Cordella Banner   On: 12/14/2023 19:30   IR EMBO ART  VEN HEMORR LYMPH EXTRAV  INC GUIDE ROADMAPPING Result Date: 12/14/2023 INDICATION: Postprocedure acute hemorrhage left pelvic sidewall. The patient also has a documented acute lower extremity DVT involving the left external iliac, distal common iliac, common femoral vein, and superficial femoral vein with and inability to anticoagulate secondary to the hemorrhage. EXAM: Angiogram with embolization MEDICATIONS: None. The antibiotic was administered within 1 hour of the procedure ANESTHESIA/SEDATION: Moderate (conscious) sedation was employed during this procedure. A total of Versed  0 mg and Fentanyl  150 mcg was administered intravenously by the radiology nurse. Total intra-service moderate Sedation Time: 60 minutes. The patient's level of consciousness and vital signs were monitored continuously by radiology nursing throughout the procedure under my direct supervision. FLUOROSCOPY: Radiation Exposure Index (as provided by the fluoroscopic device): 346 mGy Kerma COMPLICATIONS: None immediate. PROCEDURE: Informed consent was obtained from the patient following explanation of the procedure, risks, benefits and alternatives. The patient understands, agrees and consents for the procedure. All questions were addressed. A time out was performed prior to the initiation of the procedure. Maximal barrier sterile technique utilized including caps, mask, sterile gowns, sterile gloves, large sterile drape, hand hygiene, and Betadine prep. In a supine position, the right groin was prepped draped usual sterile fashion. Local anesthesia was achieved with 1% lidocaine . Lidocaine  was infiltrated in the subcutaneous tissue overlying the right common femoral artery. Ultrasound  demonstrated the right common femoral artery to be anechoic and pulse static cane patency. The needle was advanced from the skin to the midpoint of the lumen of the artery under ultrasound guidance. Final image was obtained stored in patient's permanent medical record. Access was exchanged over an 035 guidewire which was advanced under fluoroscopy to the abdominal aorta. Using a combination of standard catheter guidewire technique, the catheter and guidewire were advanced to the abdominal aorta and then into the left common iliac artery. From this position, access was exchanged for a 6 [place] sheath. Through this sheath, the internal iliac artery was selected. In internal iliac angiogram was performed from this position. The anterior and posterior divisions of the internal iliac artery are well visualized. There is a ilial lumbar branch that originates from the internal iliac artery that has an active hemorrhage/pseudoaneurysm. Using standard catheter and guidewire technique in coaxial  fashion common 014 guidewire and catheter were advanced through the angled glide catheter from the 6 French sheath in into the ilial lumbar branch originating from the internal iliac artery. Guidewire was removed once the artery was engaged and a dedicated angiogram was performed from this position demonstrating the pseudoaneurysm/active hemorrhage as well as the distal branching and anatomy of this vessel. Using this angiogram, the catheter and guidewire were advanced beyond the bleed. Slowly withdrawing the microcatheter as detachable coils were then used to selectively embolize this vessel. Once the origin of the hemorrhage was crossed with embolic material, a follow-up angiogram was performed demonstrating complete occlusion of this artery. No further active hemorrhage identified. Catheter and guidewire were removed. The sheath was withdrawn to the ipsilateral side and an angiogram of the right common femoral artery was  performed. The patient demonstrates variant anatomy not favorable to a closure device. Hemostasis was achieved with compression. IMPRESSION: Acute hemorrhage identified in the left ilial lumbar branch successfully embolized with detachable coils. Electronically Signed   By: Cordella Banner   On: 12/14/2023 19:27   IR US  Guide Vasc Access Right Result Date: 12/14/2023 Banner Cordella LABOR, MD     12/14/2023  7:14 PM Interventional Radiology Procedure Note Procedure: IVC filter placement and left internal iliac branch embolization Complications: None Estimated Blood Loss: < 10 mL Findings: RIJ access for IVC filter placement.  RCFA access for left iliolumbar branch coil embolization. Cordella LABOR Banner, MD   IR Angiogram Pelvis Selective Or Supraselective Result Date: 12/14/2023 Banner Cordella LABOR, MD     12/14/2023  7:14 PM Interventional Radiology Procedure Note Procedure: IVC filter placement and left internal iliac branch embolization Complications: None Estimated Blood Loss: < 10 mL Findings: RIJ access for IVC filter placement.  RCFA access for left iliolumbar branch coil embolization. Cordella LABOR Banner, MD   IR Angiogram Visceral Selective Result Date: 12/14/2023 Banner Cordella LABOR, MD     12/14/2023  7:14 PM Interventional Radiology Procedure Note Procedure: IVC filter placement and left internal iliac branch embolization Complications: None Estimated Blood Loss: < 10 mL Findings: RIJ access for IVC filter placement.  RCFA access for left iliolumbar branch coil embolization. Cordella LABOR Banner, MD   CT Angio Abd/Pel w/ and/or w/o Result Date: 12/14/2023 CLINICAL DATA:  Follow-up retroperitoneal hematoma EXAM: CTA ABDOMEN AND PELVIS WITHOUT AND WITH CONTRAST TECHNIQUE: Multidetector CT imaging of the abdomen and pelvis was performed using the standard protocol during bolus administration of intravenous contrast. Multiplanar reconstructed images and MIPs were obtained and reviewed to evaluate the vascular  anatomy. RADIATION DOSE REDUCTION: This exam was performed according to the departmental dose-optimization program which includes automated exposure control, adjustment of the mA and/or kV according to patient size and/or use of iterative reconstruction technique. CONTRAST:  75mL OMNIPAQUE  IOHEXOL  350 MG/ML SOLN COMPARISON:  CT 12/14/2023, 12/10/2023 FINDINGS: VASCULAR Aorta: Non contrasted images demonstrate no acute intramural hematoma. Moderate aortic atherosclerosis. No aneurysm or dissection. No significant stenosis or occlusion. Celiac: Mild to moderate origin stenosis. Distal patency with no evidence for aneurysm dissection or occlusion SMA: Patent without evidence of aneurysm, dissection, vasculitis or significant stenosis. Renals: Both renal arteries are patent without evidence of aneurysm, dissection, vasculitis, fibromuscular dysplasia or significant stenosis. IMA: Patent without evidence of aneurysm, dissection, vasculitis or significant stenosis. Inflow: Moderate aortic atherosclerosis. Common iliac vessels show no evidence for aneurysm, dissection or high-grade stenosis. Patent external iliac vessels. Globular contrast collection within or adjacent to the left psoas muscle, at the level of  upper sacrum, this measures 14 x 12 mm on series 5, image 156 and diminishes on portal venous phase images. 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 Proximal Outflow: Suboptimally evaluated due to limited contrast enhancement but grossly patent to the common femoral vessels. Veins: Patent portal and splenic veins. Review of the MIP images confirms the above findings. NON-VASCULAR Lower chest: Lung bases demonstrate bandlike atelectasis in the right lower lobe. Hepatobiliary: No focal liver abnormality is seen. No gallstones, gallbladder wall thickening, or biliary dilatation. Pancreas: Unremarkable. No pancreatic ductal  dilatation or surrounding inflammatory changes. Spleen: Normal in size without focal abnormality. Adrenals/Urinary Tract: Adrenal glands are normal. Excreted contrast within the renal collecting systems. Contrast in the urinary bladder Stomach/Bowel: Stomach is nonenlarged. No dilated small bowel. No acute bowel wall thickening. Lymphatic: No suspicious lymph nodes Reproductive: Hysterectomy.  No adnexal mass Other: Redemonstrated large left retroperitoneal hematoma, involves left psoas and left pelvic sidewall. Hyperdense components consistent with recent hematoma. This is increased compared to the CT from 07/02. Hyperdense presacral fluid stable compared to earlier today. Musculoskeletal: Linear biopsy tract within the left sacrum, series 5, image 156. IMPRESSION: 1. Redemonstrated large left retroperitoneal hematoma, that involves the left psoas muscle and left pelvic sidewall with hyperdense areas suspicious for recent hemorrhage. Positive for 14 x 12 mm pseudoaneurysm in the left retroperitoneum, posterior to the left psoas muscle at the level of sacral biopsy tract. Pseudoaneurysm arises from a small branch vessel of the left internal iliac artery. Critical Value/emergent results were called by telephone at the time of interpretation on 12/14/2023 at 6:27 pm to provider [facility] , who verbally acknowledged these results. Electronically Signed   By: Luke Bun M.D.   On: 12/14/2023 18:27   CT ABDOMEN PELVIS W CONTRAST Result Date: 12/14/2023 CLINICAL DATA:  Evaluate retroperitoneal bleed. EXAM: CT ABDOMEN AND PELVIS WITH CONTRAST TECHNIQUE: Multidetector CT imaging of the abdomen and pelvis was performed using the standard protocol following bolus administration of intravenous contrast. RADIATION DOSE REDUCTION: This exam was performed according to the departmental dose-optimization program which includes automated exposure control, adjustment of the mA and/or kV according to patient size and/or use of  iterative reconstruction technique. CONTRAST:  OMNIPAQUE  IOHEXOL  300 MG/ML  SOLN COMPARISON:  12/10/2023 FINDINGS: Lower chest: Subsegmental atelectasis noted in both lung bases. No pleural effusion or consolidative change. Hepatobiliary: No focal liver abnormality is seen. No gallstones, gallbladder wall thickening, or biliary dilatation. Pancreas: Unremarkable. No pancreatic ductal dilatation or surrounding inflammatory changes. Spleen: Normal in size without focal abnormality. Adrenals/Urinary Tract: Normal adrenal glands. No nephrolithiasis, hydronephrosis or suspicious mass. Circumferential wall thickening of the bladder. No focal bladder abnormality. Stomach/Bowel: Stomach appears within normal limits. No dilated loops of large or small bowel. No bowel wall thickening, inflammation or distension. Vascular/Lymphatic: Aortic atherosclerosis. No enlarged abdominal or pelvic lymph nodes. Reproductive: Status post hysterectomy. No adnexal masses. Other: Again seen are changes of left retroperitoneal hematoma with extension into the left psoas muscle and left pelvic sidewall. Persistent hyperdense components within this area measuring up to 74 Hounsfield units. Increased component is identified within the left lower quadrant anterior to the left psoas muscle which measures 9.3 x 6.1 cm, image 63/2. Similar appearance of the component involving the left psoas muscle and left extraperitoneal space along the left pelvic sidewall. Increased fluid within the presacral soft tissue space is also similar to previous exam. Sub optimal visualization  of potential pseudoaneurysm due to timing of contrast bolus. Musculoskeletal: Left sacral wing biopsy tract is again noted, image 64/2. IMPRESSION: 1. Again seen are changes of left retroperitoneal hematoma with extension into the left psoas muscle and left pelvic sidewall. There are areas of increased hyperdensity within the hematomas measuring up to 75 Hounsfield units  which is concerning for acute blood and active bleeding. Increased component is identified within the left lower quadrant anterior to the asymmetrically enlarged left psoas muscle which measures 9.3 x 6.1 cm. Similar volume of the component involving the left psoas muscle and left extraperitoneal space along the left pelvic sidewall. 2. Suboptimal visualization of potential pseudoaneurysm due to timing of contrast bolus. 3. Circumferential wall thickening of the bladder. Correlate for any clinical signs or symptoms of cystitis. 4.  Aortic Atherosclerosis (ICD10-I70.0). Electronically Signed   By: Waddell Calk M.D.   On: 12/14/2023 13:01   VAS US  LOWER EXTREMITY VENOUS (DVT) Result Date: 12/14/2023  Lower Venous DVT Study Patient Name:  ZSOFIA PROUT [facility]  Date of Exam:   12/13/2023 Medical Rec #: [id number]         Accession #:    [id number] Date of Birth: 05-03-1956          Patient Gender: F Patient Age:   65 years Exam Location:  [facility] Procedure:      VAS US  LOWER EXTREMITY VENOUS (DVT) Referring Phys: Baylea Milburn --------------------------------------------------------------------------------  Indications: Edema. Other Indications: Patient had bone marrow biopsy 12/10/2023 with ensuing lower                    back pain that radiated to groin and thigh. Risk Factors: Left psoas intramuscular hematoma with associated at least moderate volume retroperitoneal and pelvic hematoma. Limitations: Poor ultrasound/tissue interface and edema. Comparison Study: No prior study on file Performing Technologist: Alberta Lis RVS  Examination Guidelines: A complete evaluation includes B-mode imaging, spectral Doppler, color Doppler, and power Doppler as needed of all accessible portions of each vessel. Bilateral testing is considered an integral part of a complete examination. Limited examinations for reoccurring indications may be performed as noted. The reflux portion of the exam is performed with the patient in  reverse Trendelenburg.  +---------+---------------+---------+-----------+----------+--------------+ RIGHT    CompressibilityPhasicitySpontaneityPropertiesThrombus Aging +---------+---------------+---------+-----------+----------+--------------+ CFV      Full           Yes      Yes                                 +---------+---------------+---------+-----------+----------+--------------+ SFJ      Full                                                        +---------+---------------+---------+-----------+----------+--------------+ FV Prox  Full           Yes      Yes                                 +---------+---------------+---------+-----------+----------+--------------+ FV Mid   Full                                                        +---------+---------------+---------+-----------+----------+--------------+  FV DistalFull           Yes      Yes                                 +---------+---------------+---------+-----------+----------+--------------+ PFV      Full           Yes      Yes                                 +---------+---------------+---------+-----------+----------+--------------+ POP      Full           Yes      Yes                                 +---------+---------------+---------+-----------+----------+--------------+ PTV      Full                                                        +---------+---------------+---------+-----------+----------+--------------+ PERO     Full                                                        +---------+---------------+---------+-----------+----------+--------------+   +----------+---------------+---------+-----------+----------+--------------+ LEFT      CompressibilityPhasicitySpontaneityPropertiesThrombus Aging +----------+---------------+---------+-----------+----------+--------------+ CFV       None           No       No                   Acute           +----------+---------------+---------+-----------+----------+--------------+ SFJ       Partial        No       No                   Acute          +----------+---------------+---------+-----------+----------+--------------+ FV Prox   Partial        Yes      No                   Acute          +----------+---------------+---------+-----------+----------+--------------+ FV Mid    Full                                                        +----------+---------------+---------+-----------+----------+--------------+ FV Distal Full                                                        +----------+---------------+---------+-----------+----------+--------------+ PFV       Partial        No  No                   Acute          +----------+---------------+---------+-----------+----------+--------------+ POP       Full           Yes      No                                  +----------+---------------+---------+-----------+----------+--------------+ PTV       None                                         Acute          +----------+---------------+---------+-----------+----------+--------------+ PERO      None                                         Acute          +----------+---------------+---------+-----------+----------+--------------+ Gastroc   Full           No       No                                  +----------+---------------+---------+-----------+----------+--------------+ EIV                      No       No                   Acute          +----------+---------------+---------+-----------+----------+--------------+ CIV distal               No       No                   Acute          +----------+---------------+---------+-----------+----------+--------------+ Unable to visualize mid to distal common iliac vein. The IVC is patent.    Summary: RIGHT: - There is no evidence of deep vein thrombosis in the lower extremity.  - No cystic  structure found in the popliteal fossa.  LEFT: - Findings consistent with acute deep vein thrombosis involving the left external iliac, distal common iliac, common femoral vein, SF junction, left femoral vein, left proximal profunda vein, left posterior tibial veins, and left peroneal veins.  - No cystic structure found in the popliteal fossa. - Unable to visualize mid to distal common iliac vein. The IVC is patent. Unable to place order for IVC/Iliac duplex, added scan to LEV study.  *See table(s) above for measurements and observations. Electronically signed by Lonni Gaskins MD on 12/14/2023 at 12:09:18 PM.    Final    CT ABDOMEN PELVIS W CONTRAST Result Date: 12/10/2023 CLINICAL DATA:  Abdominal trauma, penetrating EXAM: CT ABDOMEN AND PELVIS WITH CONTRAST TECHNIQUE: Multidetector CT imaging of the abdomen and pelvis was performed using the standard protocol following bolus administration of intravenous contrast. RADIATION DOSE REDUCTION: This exam was performed according to the departmental dose-optimization program which includes automated exposure control, adjustment of the mA and/or kV according to patient size and/or use of iterative reconstruction technique. CONTRAST:  OMNIPAQUE  IOHEXOL  300 MG/ML  SOLN COMPARISON:  CT lumbar spine 12/10/2023 FINDINGS: Lower chest: No acute abnormality.  Coronary artery calcification. Hepatobiliary: Not enlarged. No focal lesion. No laceration or subcapsular hematoma. The gallbladder is otherwise unremarkable with no radio-opaque gallstones. No biliary ductal dilatation. Pancreas: Normal pancreatic contour. No main pancreatic duct dilatation. Spleen: Not enlarged. No focal lesion. No laceration, subcapsular hematoma, or vascular injury. Adrenals/Urinary Tract: No nodularity bilaterally. Bilateral kidneys enhance symmetrically. No hydronephrosis. No contusion, laceration, or subcapsular hematoma. No injury to the vascular structures or collecting systems. No  hydroureter. 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 Otherwise the urinary bladder is unremarkable. Stomach/Bowel: No small or large bowel wall thickening or dilatation. Colonic diverticulosis. The appendix is unremarkable. Vasculature/Lymphatics: No abdominal aorta or iliac aneurysm. No active contrast extravasation or pseudoaneurysm. No abdominal, pelvic, inguinal lymphadenopathy. Reproductive: Normal. Other: Stable left retroperitoneal and left psoas intramuscular hematoma extending to the left pelvis, space of Retzius, and inguinal region. No definite associated active hemorrhage with limited evaluation due to timing of contrast. No simple free fluid ascites. No pneumoperitoneum. No organized fluid collection. Musculoskeletal: No significant soft tissue hematoma. No acute pelvic fracture. No spinal fracture. Grade 1 anterolisthesis of L5 on S1. Left sacral ala biopsy site noted (2:56). Other ports and devices: None. IMPRESSION: 1. Stable left retroperitoneal and left psoas intramuscular hematoma extending to the left pelvis, space of Retzius, and inguinal region. No definite evidence of associated active extravasation. 2. Other imaging findings of potential clinical significance: Colonic diverticulosis with no acute diverticulitis. Aortic Atherosclerosis (ICD10-I70.0). Electronically Signed   By: Morgane  Naveau M.D.   On: 12/10/2023 23:17   CT Lumbar Spine Wo Contrast Result Date: 12/10/2023 CLINICAL DATA:  Myelopathy, acute, lumbar spine Low back pain radiating down L leg, weakness L leg after bone marrow biopsy Pt bib family for from advice from cancer center pain after bone marrow biopsy. Lower back pain that radiates down groin the left leg. Nausea and vomiting as well. Issues with biopsy today had to attempt three times. EXAM: CT LUMBAR SPINE WITHOUT CONTRAST TECHNIQUE: Multidetector CT imaging of the lumbar spine was performed without intravenous contrast  administration. Multiplanar CT image reconstructions were also generated. RADIATION DOSE REDUCTION: This exam was performed according to the departmental dose-optimization program which includes automated exposure control, adjustment of the mA and/or kV according to patient size and/or use of iterative reconstruction technique. COMPARISON:  None Available. FINDINGS: Segmentation: 5 lumbar type vertebrae. Alignment: Grade 1 anterolisthesis of L5 on S1. Vertebrae: Multilevel mild degenerative changes of the spine. No acute fracture or focal pathologic process. Paraspinal and other soft tissues: Negative. Disc levels: Intervertebral disc space maintained. Other: Atherosclerotic plaque. Asymmetrically enlarged left psoas muscle with associated retroperitoneal hematoma and fat stranding extending down to the pelvis and presacral space with as well as left pelvic sidewall. Partially visualized 6.2 x 3.8 cm left pelvic sidewall hematoma with markedly limited evaluation on this noncontrast study. Left sacral ala linear lucencies consistent with recent biopsy. IMPRESSION: 1. Left psoas muscle hematoma with associated at least moderate volume retroperitoneal and pelvic hematoma. Partially visualized 6.2 x 3.8 cm left pelvic sidewall hematoma with markedly limited evaluation on this noncontrast study. Recommend further evaluation with a trauma scan with intravenous contrast of the abdomen and pelvis including delayed imaging of the abdomen and pelvis (please make note in comments for CT tech to obtain delayed images of both the abdomen and pelvis). Finding consistent with traumatic injury in the setting of recent biopsy. 2.  Left sacral ala linear lucencies consistent with recent biopsy. 3. No acute displaced fracture or traumatic listhesis of the lumbar spine. 4.  Aortic Atherosclerosis (ICD10-I70.0). Electronically Signed   By: Morgane  Naveau M.D.   On: 12/10/2023 21:37     Discharge Exam: Vitals:   12/14/23 1910  12/14/23 2100  BP: 132/64 136/75  Pulse: 90 90  Resp: 16   Temp:  97.9 F (36.6 C)  SpO2: 98% 98%   Vitals:   12/14/23 1900 12/14/23 1905 12/14/23 1910 12/14/23 2100  BP: (!) 131/58 (!) 130/55 132/64 136/75  Pulse: 92  90 90  Resp: 14 14 16    Temp:    97.9 F (36.6 C)  TempSrc:    Oral  SpO2: 97% 98% 98% 98%  Weight:      Height:        General: Pt is alert, awake, not in acute distress Cardiovascular: RRR, S1/S2 +, no rubs, no gallops Respiratory: CTA bilaterally, no wheezing, no rhonchi Abdominal: Soft, NT, ND, bowel sounds + Extremities: Left lower extremity edema, better than yesterday.  No cyanosis    The results of significant diagnostics from this hospitalization (including imaging, microbiology, ancillary and laboratory) are listed below for reference.     Microbiology: No results found for this or any previous visit (from the past 240 hours).   Labs: BNP (last 3 results) No results for input(s): BNP in the last 8760 hours. Basic Metabolic Panel: Recent Labs  Lab 12/10/23 1946 12/11/23 0508 12/15/23 0455  NA 135 135 136  K 4.3 3.8 4.3  CL 99 100 100  CO2 23 27 25   GLUCOSE 145* 96 93  BUN 29* 22 15  CREATININE 1.04* 0.86 0.82  CALCIUM 8.9 8.6* 8.7*   Liver Function Tests: Recent Labs  Lab 12/10/23 1946  AST 21  ALT 17  ALKPHOS 65  BILITOT 0.8  PROT 7.2  ALBUMIN 4.0   Recent Labs  Lab 12/10/23 1946  LIPASE 41   No results for input(s): AMMONIA in the last 168 hours. CBC: Recent Labs  Lab 12/12/23 0530 12/13/23 0552 12/14/23 0733 12/14/23 1600 12/15/23 0500  WBC 13.9* 17.3* 11.5* 10.4 10.7*  NEUTROABS 10.8* 11.1* 7.8* 7.1 6.8  HGB 9.8* 8.7* 7.7* 7.4* 7.6*  HCT 30.8* 26.7* 23.9* 22.5* 23.3*  MCV 96.9 97.1 96.4 95.7 97.5  PLT 569* 734* 619* 625* 653*   Cardiac Enzymes: No results for input(s): CKTOTAL, CKMB, CKMBINDEX, TROPONINI in the last 168 hours. BNP: Invalid input(s): POCBNP CBG: No results for input(s):  GLUCAP in the last 168 hours. D-Dimer No results for input(s): DDIMER in the last 72 hours. Hgb A1c No results for input(s): HGBA1C in the last 72 hours. Lipid Profile No results for input(s): CHOL, HDL, LDLCALC, TRIG, CHOLHDL, LDLDIRECT in the last 72 hours. Thyroid function studies No results for input(s): TSH, T4TOTAL, T3FREE, THYROIDAB in the last 72 hours.  Invalid input(s): FREET3 Anemia work up No results for input(s): VITAMINB12, FOLATE, FERRITIN, TIBC, IRON, RETICCTPCT in the last 72 hours. Urinalysis    Component Value Date/Time   COLORURINE AMBER (A) 12/10/2023 1951   APPEARANCEUR HAZY (A) 12/10/2023 1951   LABSPEC 1.024 12/10/2023 1951   PHURINE 5.0 12/10/2023 1951   GLUCOSEU NEGATIVE 12/10/2023 1951   HGBUR NEGATIVE 12/10/2023 1951   BILIRUBINUR NEGATIVE 12/10/2023 1951   KETONESUR NEGATIVE 12/10/2023 1951   PROTEINUR 100 (A) 12/10/2023 1951   UROBILINOGEN 0.2 04/09/2012 0859   NITRITE NEGATIVE 12/10/2023 1951   LEUKOCYTESUR TRACE (A)  12/10/2023 1951   Sepsis Labs Recent Labs  Lab 12/13/23 0552 12/14/23 0733 12/14/23 1600 12/15/23 0500  WBC 17.3* 11.5* 10.4 10.7*   Microbiology No results found for this or any previous visit (from the past 240 hours).  FURTHER DISCHARGE INSTRUCTIONS:   Get Medicines reviewed and adjusted: Please take all your medications with you for your next visit with your Primary MD   Laboratory/radiological data: Please request your Primary MD to go over all hospital tests and procedure/radiological results at the follow up, please ask your Primary MD to get all Hospital records sent to his/her office.   In some cases, they will be blood work, cultures and biopsy results pending at the time of your discharge. Please request that your primary care M.D. goes through all the records of your hospital data and follows up on these results.   Also Note the following: If you experience worsening of  your admission symptoms, develop shortness of breath, life threatening emergency, suicidal or homicidal thoughts you must seek medical attention immediately by calling 911 or calling your MD immediately  if symptoms less severe.   You must read complete instructions/literature along with all the possible adverse reactions/side effects for all the Medicines you take and that have been prescribed to you. Take any new Medicines after you have completely understood and accpet all the possible adverse reactions/side effects.    patient was instructed, not to drive, operate heavy machinery, perform activities at heights, swimming or participation in water activities or provide baby-sitting services while on Pain, Sleep and Anxiety Medications; until their outpatient Physician has advised to do so again. Also recommended to not to take more than prescribed Pain, Sleep and Anxiety Medications.  It is not advisable to combine anxiety, sleep and pain medications without talking with your primary care provider.     Wear Seat belts while driving.   Please note: You were cared for by a hospitalist during your hospital stay. Once you are discharged, your primary care physician will handle any further medical issues. Please note that NO REFILLS for any discharge medications will be authorized once you are discharged, as it is imperative that you return to your primary care physician (or establish a relationship with a primary care physician if you do not have one) for your post hospital discharge needs so that they can reassess your need for medications and monitor your lab values  Time coordinating discharge: Over 30 minutes  SIGNED:   Fredia Skeeter, MD  [facility] 12/15/2023, 10:30 AM *Please note that this is a verbal dictation therefore any spelling or grammatical errors are due to the Dragon [employer] One system interpretation. If 7PM-7AM, please contact night-coverage [email or web address]

## 2023-12-15 NOTE — Progress Notes (Addendum)
 AVS reviewed w/ patient who verbalized an understanding. Tele box removed and returned to main desk.  Pt asked about showering- this RN spoke with IR staff who stated pt needed to wait 48 hours. Opsite peeling up at bottom edge, reinforced w/ paper tape. Pt verbalized an understanding when she should shower. PIV removed as noted. TOC med in a secure bag delivered to patient in room. IR doctor to see pt prior to discharge per primary nurse. Pt dressed for d/c to home.

## 2023-12-16 ENCOUNTER — Telehealth: Payer: Self-pay | Admitting: *Deleted

## 2023-12-16 ENCOUNTER — Other Ambulatory Visit: Payer: Self-pay | Admitting: *Deleted

## 2023-12-16 DIAGNOSIS — D75839 Thrombocytosis, unspecified: Secondary | ICD-10-CM

## 2023-12-17 ENCOUNTER — Telehealth: Payer: Self-pay | Admitting: Hematology and Oncology

## 2023-12-17 ENCOUNTER — Inpatient Hospital Stay

## 2023-12-17 DIAGNOSIS — Z79899 Other long term (current) drug therapy: Secondary | ICD-10-CM | POA: Diagnosis not present

## 2023-12-17 DIAGNOSIS — I82402 Acute embolism and thrombosis of unspecified deep veins of left lower extremity: Secondary | ICD-10-CM | POA: Diagnosis present

## 2023-12-17 DIAGNOSIS — D649 Anemia, unspecified: Secondary | ICD-10-CM | POA: Diagnosis not present

## 2023-12-17 DIAGNOSIS — D473 Essential (hemorrhagic) thrombocythemia: Secondary | ICD-10-CM | POA: Diagnosis not present

## 2023-12-17 DIAGNOSIS — Z87891 Personal history of nicotine dependence: Secondary | ICD-10-CM | POA: Diagnosis not present

## 2023-12-17 DIAGNOSIS — D75839 Thrombocytosis, unspecified: Secondary | ICD-10-CM | POA: Diagnosis present

## 2023-12-17 LAB — CBC WITH DIFFERENTIAL (CANCER CENTER ONLY)
Abs Immature Granulocytes: 0.1 K/uL — ABNORMAL HIGH (ref 0.00–0.07)
Basophils Absolute: 0 K/uL (ref 0.0–0.1)
Basophils Relative: 0 %
Eosinophils Absolute: 0.4 K/uL (ref 0.0–0.5)
Eosinophils Relative: 4 %
HCT: 23.8 % — ABNORMAL LOW (ref 36.0–46.0)
Hemoglobin: 7.8 g/dL — ABNORMAL LOW (ref 12.0–15.0)
Immature Granulocytes: 1 %
Lymphocytes Relative: 18 %
Lymphs Abs: 2 K/uL (ref 0.7–4.0)
MCH: 31.3 pg (ref 26.0–34.0)
MCHC: 32.8 g/dL (ref 30.0–36.0)
MCV: 95.6 fL (ref 80.0–100.0)
Monocytes Absolute: 1 K/uL (ref 0.1–1.0)
Monocytes Relative: 9 %
Neutro Abs: 7.7 K/uL (ref 1.7–7.7)
Neutrophils Relative %: 68 %
Platelet Count: 745 K/uL — ABNORMAL HIGH (ref 150–400)
RBC: 2.49 MIL/uL — ABNORMAL LOW (ref 3.87–5.11)
RDW: 14.8 % (ref 11.5–15.5)
WBC Count: 11.2 K/uL — ABNORMAL HIGH (ref 4.0–10.5)
nRBC: 0 % (ref 0.0–0.2)

## 2023-12-17 LAB — SAMPLE TO BLOOD BANK

## 2023-12-17 NOTE — Telephone Encounter (Signed)
 Spoke with patient confirming upcoming appointment

## 2023-12-19 ENCOUNTER — Encounter (HOSPITAL_COMMUNITY): Payer: Self-pay

## 2023-12-24 ENCOUNTER — Other Ambulatory Visit: Payer: Self-pay | Admitting: *Deleted

## 2023-12-24 ENCOUNTER — Telehealth: Payer: Self-pay | Admitting: *Deleted

## 2023-12-24 ENCOUNTER — Other Ambulatory Visit (HOSPITAL_COMMUNITY): Payer: Self-pay

## 2023-12-24 ENCOUNTER — Encounter: Payer: Self-pay | Admitting: Hematology and Oncology

## 2023-12-24 ENCOUNTER — Inpatient Hospital Stay

## 2023-12-24 DIAGNOSIS — D75839 Thrombocytosis, unspecified: Secondary | ICD-10-CM | POA: Diagnosis not present

## 2023-12-24 LAB — CBC WITH DIFFERENTIAL/PLATELET
Abs Immature Granulocytes: 0.11 K/uL — ABNORMAL HIGH (ref 0.00–0.07)
Basophils Absolute: 0.1 K/uL (ref 0.0–0.1)
Basophils Relative: 1 %
Eosinophils Absolute: 0.4 K/uL (ref 0.0–0.5)
Eosinophils Relative: 3 %
HCT: 29.9 % — ABNORMAL LOW (ref 36.0–46.0)
Hemoglobin: 9.6 g/dL — ABNORMAL LOW (ref 12.0–15.0)
Immature Granulocytes: 1 %
Lymphocytes Relative: 16 %
Lymphs Abs: 1.9 K/uL (ref 0.7–4.0)
MCH: 30.8 pg (ref 26.0–34.0)
MCHC: 32.1 g/dL (ref 30.0–36.0)
MCV: 95.8 fL (ref 80.0–100.0)
Monocytes Absolute: 0.9 K/uL (ref 0.1–1.0)
Monocytes Relative: 7 %
Neutro Abs: 8.6 K/uL — ABNORMAL HIGH (ref 1.7–7.7)
Neutrophils Relative %: 72 %
Platelets: 1208 K/uL (ref 150–400)
RBC: 3.12 MIL/uL — ABNORMAL LOW (ref 3.87–5.11)
RDW: 15.4 % (ref 11.5–15.5)
WBC: 12 K/uL — ABNORMAL HIGH (ref 4.0–10.5)
nRBC: 0 % (ref 0.0–0.2)

## 2023-12-24 MED ORDER — ANAGRELIDE HCL 1 MG PO CAPS
1.0000 mg | ORAL_CAPSULE | Freq: Every day | ORAL | 1 refills | Status: DC
  Filled 2023-12-24: qty 30, 30d supply, fill #0

## 2023-12-24 MED ORDER — ANAGRELIDE HCL 1 MG PO CAPS: 1.0000 mg | ORAL_CAPSULE | Freq: Every day | ORAL | 1 refills | Status: DC

## 2023-12-24 NOTE — Telephone Encounter (Signed)
 No entry

## 2023-12-24 NOTE — Telephone Encounter (Signed)
 This RN reviewed lab with MD with noted elevated platelets- recommendation given for anagrelide  1 mg daily.  This RN called pt reviewed lab and MD recommendation

## 2023-12-30 ENCOUNTER — Ambulatory Visit (HOSPITAL_COMMUNITY)
Admission: RE | Admit: 2023-12-30 | Discharge: 2023-12-30 | Disposition: A | Discharge status: Home / Self Care | Payer: Self-pay | Source: Ambulatory Visit | Attending: Family Medicine | Admitting: Family Medicine

## 2023-12-30 ENCOUNTER — Inpatient Hospital Stay

## 2023-12-30 DIAGNOSIS — D75839 Thrombocytosis, unspecified: Secondary | ICD-10-CM | POA: Diagnosis not present

## 2023-12-30 DIAGNOSIS — E7800 Pure hypercholesterolemia, unspecified: Secondary | ICD-10-CM | POA: Insufficient documentation

## 2023-12-30 LAB — CBC WITH DIFFERENTIAL/PLATELET
Abs Immature Granulocytes: 0.06 K/uL (ref 0.00–0.07)
Basophils Absolute: 0.1 K/uL (ref 0.0–0.1)
Basophils Relative: 1 %
Eosinophils Absolute: 0.4 K/uL (ref 0.0–0.5)
Eosinophils Relative: 4 %
HCT: 32.5 % — ABNORMAL LOW (ref 36.0–46.0)
Hemoglobin: 10.4 g/dL — ABNORMAL LOW (ref 12.0–15.0)
Immature Granulocytes: 1 %
Lymphocytes Relative: 23 %
Lymphs Abs: 2.3 K/uL (ref 0.7–4.0)
MCH: 30.7 pg (ref 26.0–34.0)
MCHC: 32 g/dL (ref 30.0–36.0)
MCV: 95.9 fL (ref 80.0–100.0)
Monocytes Absolute: 0.6 K/uL (ref 0.1–1.0)
Monocytes Relative: 6 %
Neutro Abs: 6.5 K/uL (ref 1.7–7.7)
Neutrophils Relative %: 65 %
Platelets: 1006 K/uL (ref 150–400)
RBC: 3.39 MIL/uL — ABNORMAL LOW (ref 3.87–5.11)
RDW: 15 % (ref 11.5–15.5)
WBC: 9.9 K/uL (ref 4.0–10.5)
nRBC: 0 % (ref 0.0–0.2)

## 2024-01-01 ENCOUNTER — Telehealth: Payer: Self-pay

## 2024-01-02 ENCOUNTER — Inpatient Hospital Stay (HOSPITAL_BASED_OUTPATIENT_CLINIC_OR_DEPARTMENT_OTHER): Admitting: Hematology and Oncology

## 2024-01-02 ENCOUNTER — Telehealth: Payer: Self-pay | Admitting: *Deleted

## 2024-01-02 ENCOUNTER — Inpatient Hospital Stay

## 2024-01-02 ENCOUNTER — Ambulatory Visit (HOSPITAL_BASED_OUTPATIENT_CLINIC_OR_DEPARTMENT_OTHER)
Admission: RE | Admit: 2024-01-02 | Discharge: 2024-01-02 | Disposition: A | Discharge status: Home / Self Care | Source: Ambulatory Visit | Attending: Hematology and Oncology | Admitting: Hematology and Oncology

## 2024-01-02 ENCOUNTER — Telehealth: Payer: Self-pay | Admitting: Hematology and Oncology

## 2024-01-02 VITALS — BP 121/51 | HR 99 | Temp 97.5°F | Resp 18 | Ht 64.0 in | Wt 185.8 lb

## 2024-01-02 DIAGNOSIS — D75839 Thrombocytosis, unspecified: Secondary | ICD-10-CM

## 2024-01-02 DIAGNOSIS — I82412 Acute embolism and thrombosis of left femoral vein: Secondary | ICD-10-CM

## 2024-01-02 LAB — CBC WITH DIFFERENTIAL/PLATELET
Abs Immature Granulocytes: 0.04 K/uL (ref 0.00–0.07)
Basophils Absolute: 0.1 K/uL (ref 0.0–0.1)
Basophils Relative: 1 %
Eosinophils Absolute: 0.4 K/uL (ref 0.0–0.5)
Eosinophils Relative: 5 %
HCT: 31.6 % — ABNORMAL LOW (ref 36.0–46.0)
Hemoglobin: 9.9 g/dL — ABNORMAL LOW (ref 12.0–15.0)
Immature Granulocytes: 1 %
Lymphocytes Relative: 28 %
Lymphs Abs: 2.2 K/uL (ref 0.7–4.0)
MCH: 29.9 pg (ref 26.0–34.0)
MCHC: 31.3 g/dL (ref 30.0–36.0)
MCV: 95.5 fL (ref 80.0–100.0)
Monocytes Absolute: 0.7 K/uL (ref 0.1–1.0)
Monocytes Relative: 9 %
Neutro Abs: 4.4 K/uL (ref 1.7–7.7)
Neutrophils Relative %: 56 %
Platelets: 729 K/uL — ABNORMAL HIGH (ref 150–400)
RBC: 3.31 MIL/uL — ABNORMAL LOW (ref 3.87–5.11)
RDW: 14.9 % (ref 11.5–15.5)
WBC: 7.7 K/uL (ref 4.0–10.5)
nRBC: 0 % (ref 0.0–0.2)

## 2024-01-02 NOTE — Progress Notes (Signed)
 Left lower extremity venous duplex has been completed. Preliminary results can be found in CV Proc through chart review.  Results were given to [facility] at Dr. Walter office.  01/02/24 3:23 PM Cathlyn Collet RVT

## 2024-01-02 NOTE — Telephone Encounter (Signed)
 Called from vascular lab with negative reading for any obstruction in LLE per doppler.  Informed MD

## 2024-01-02 NOTE — Progress Notes (Signed)
 [facility] CONSULT NOTE  Patient Care Team: Leonel Cole, MD as PCP - General (Family Medicine)  CHIEF COMPLAINTS/PURPOSE OF CONSULTATION:  Essential thrombocytosis.  ASSESSMENT & PLAN:   Assessment and Plan Assessment & Plan Thrombocytosis Platelet count reduced to 729,000 with anagrelide . No significant side effects reported. Anagrelide  chosen for its specific action on megakaryocytes and lesser effect on hemoglobin. - Continue anagrelide . - Check GoodRx for medication pricing. - Repeat blood work in one week to monitor hemoglobin and platelet levels.  Anemia Current hemoglobin level at 9.9, slight decrease possibly due to anagrelide . - Repeat blood work in one week to monitor hemoglobin levels.  Deep vein thrombosis (DVT) Left leg swelling reappeared, decreases with elevation. Not on anticoagulation due to previous retroperitoneal bleed. IVC filter in place. - Order vascular ultrasound to assess DVT status. - Advise to limit activity and elevate the leg over the weekend.  Travel and Activity Restrictions Advised against international travel due to recent medical conditions. Domestic travel with precautions encouraged. - Provide a letter to the insurance company regarding travel restrictions. - Encourage domestic travel with precautions: frequent movement, hydration. - Advise to start water exercises slowly and monitor response.   HISTORY OF PRESENTING ILLNESS:  [name] 68 y.o. female is here because of thrombocytosis.  Discussed the use of AI scribe software for clinical note transcription with the patient, who gave verbal consent to proceed.  History of Present Illness    History of Present Illness  [name] is a 68 year old female who presents for follow-up regarding her ET and recent DVT  She experiences leg swelling, particularly in the leg where she had a bone marrow procedure and coils placed. The swelling increased yesterday but  decreased after elevating her leg overnight. The leg is described as aching and swollen, similar to her experience in the hospital. She has not been on a blood thinner due to a previous retroperitoneal bleed.  She has a history of elevated platelet counts, with a recent count of 1.2 million, which decreased to 1.0 million after starting anagrelide  last Thursday, today is 729K .   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 She is concerned about the swelling and its implications for her health.  Rest of the pertinent 10 point ROS reviewed and neg.  All other systems were reviewed with the patient and are negative.  MEDICAL HISTORY:  Past Medical History:  Diagnosis Date   Anxiety    Arthritis    Atrophic vaginitis    Depression    GERD (gastroesophageal reflux disease)    GERD (gastroesophageal reflux disease)    Hypertension    Osteoarthritis    Seasonal allergies    Sleep apnea     SURGICAL HISTORY: Past Surgical History:  Procedure Laterality Date    fistula repair x 3  10 yrs ago   ABDOMINAL HYSTERECTOMY  yrs ago   COLONOSCOPY     IR ANGIOGRAM PELVIS SELECTIVE OR SUPRASELECTIVE  12/14/2023   IR ANGIOGRAM SELECTIVE EACH ADDITIONAL VESSEL  12/14/2023   IR EMBO ART  VEN HEMORR LYMPH EXTRAV  INC GUIDE ROADMAPPING  12/14/2023   IR IVC FILTER PLMT / S&I /IMG GUID/MOD SED  12/14/2023   IR US  GUIDE VASC ACCESS RIGHT  12/14/2023   JOINT REPLACEMENT  2012   right  right knee arthroscopic surgery  yrs ago   right rotator cuff repair  8 yrs    TOTAL KNEE ARTHROPLASTY  04/14/2012   Procedure: TOTAL KNEE ARTHROPLASTY;  Surgeon: Donnice JONETTA Car, MD;  Location: [facility];  Service: Orthopedics;  Laterality: Left;    SOCIAL HISTORY: Social History   Socioeconomic History   Marital status: Married    Spouse name: Not on file   Number  of children: Not on file   Years of education: Not on file   Highest education level: Not on file  Occupational History   Not on file  Tobacco Use   Smoking status: Former    Current packs/day: 0.00    Average packs/day: 0.3 packs/day for 20.0 years (5.0 ttl pk-yrs)    Types: Cigarettes    Start date: 06/10/1986    Quit date: 06/10/2006    Years since quitting: 17.5   Smokeless tobacco: Never  Vaping Use   Vaping status: Never Used  Substance and Sexual Activity   Alcohol use: Yes    Alcohol/week: 2.0 standard drinks of alcohol    Types: 2 Glasses of wine per week   Drug use: Yes    Types: Marijuana    Comment: marijuan use 30 yrs ago, none current   Sexual activity: Not Currently  Other Topics Concern   Not on file  Social History Narrative   Not on file   Social Drivers of Health   Financial Resource Strain: Not on file  Food Insecurity: No Food Insecurity (12/11/2023)   Hunger Vital Sign    Worried About Running Out of Food in the Last Year: Never true    Ran Out of Food in the Last Year: Never true  Transportation Needs: No Transportation Needs (12/11/2023)   PRAPARE - Administrator, Civil Service (Medical): No    Lack of Transportation (Non-Medical): No  Physical Activity: Not on file  Stress: Not on file  Social Connections: Socially Integrated (12/11/2023)   Social Connection and Isolation Panel    Frequency of Communication with Friends and Family: More than three times a week    Frequency of Social Gatherings with Friends and Family: More than three times a week    Attends Religious Services: More than 4 times per year    Active Member of [employer] or Organizations: Yes    Attends Banker Meetings: 1 to 4 times per year    Marital Status: Married  Catering manager Violence: Not At Risk (12/11/2023)   Humiliation, Afraid, Rape, and Kick questionnaire    Fear of Current or Ex-Partner: No    Emotionally Abused: No    Physically Abused: No     Sexually Abused: No    FAMILY HISTORY: Family History  Problem Relation Age of Onset   Diabetes Maternal Grandfather     ALLERGIES:  is allergic to morphine and codeine and povidone.  MEDICATIONS:  Current Outpatient Medications  Medication Sig Dispense Refill   ALPRAZolam  (XANAX ) 0.25 MG tablet Take 0.25 mg by mouth as needed. anxiety     anagrelide  (AGRYLIN) 1 MG capsule Take 1 capsule (1 mg total) by mouth daily. 30 capsule 1   ferrous sulfate  324 MG TBEC Take 324 mg by mouth every other day.     FLUoxetine  (PROZAC ) 20 MG capsule Take 20 mg by mouth every evening.  3   gabapentin  (NEURONTIN ) 300 MG capsule Take 1 capsule by mouth three times daily as needed 90 capsule 2  HYDROcodone -acetaminophen  (NORCO/VICODIN) 5-325 MG tablet Take 1-2 tablets by mouth every 6 (six) hours as needed for moderate pain (pain score 4-6). 30 tablet 0   KRILL OIL PO Take 1 capsule by mouth daily.     losartan (COZAAR) 50 MG tablet Take 50 mg by mouth every evening.     pantoprazole  (PROTONIX ) 40 MG tablet Take 40 mg by mouth daily.     Probiotic Product (PROBIOTIC DAILY PO) Take 1 capsule by mouth daily.     promethazine  (PHENERGAN ) 25 MG tablet Take 12.5-25 mg by mouth every 6 (six) hours as needed for nausea or vomiting.     No current facility-administered medications for this visit.     PHYSICAL EXAMINATION: ECOG PERFORMANCE STATUS: 0 - Asymptomatic  Vitals:   01/02/24 1227  BP: (!) 121/51  Pulse: 99  Resp: 18  Temp: (!) 97.5 F (36.4 C)  SpO2: 100%   Filed Weights   01/02/24 1227  Weight: 185 lb 12.8 oz (84.3 kg)    GENERAL:alert, no distress and comfortable No palpable LN. Left leg swelling increased compare to right, but this could be from recent DVT CTA bilaterally No splenomegaly No LE edema.  LABORATORY DATA:  I have reviewed the data as listed Lab Results  Component Value Date   WBC 7.7 01/02/2024   HGB 9.9 (L) 01/02/2024   HCT 31.6 (L) 01/02/2024   MCV 95.5  01/02/2024   PLT 729 (H) 01/02/2024     Chemistry      Component Value Date/Time   NA 136 12/15/2023 0455   K 4.3 12/15/2023 0455   CL 100 12/15/2023 0455   CO2 25 12/15/2023 0455   BUN 15 12/15/2023 0455   CREATININE 0.82 12/15/2023 0455   CREATININE 0.91 07/05/2023 1100      Component Value Date/Time   CALCIUM 8.7 (L) 12/15/2023 0455   ALKPHOS 65 12/10/2023 1946   AST 21 12/10/2023 1946   AST 16 07/05/2023 1100   ALT 17 12/10/2023 1946   ALT 15 07/05/2023 1100   BILITOT 0.8 12/10/2023 1946   BILITOT 0.4 07/05/2023 1100       RADIOGRAPHIC STUDIES: I have personally reviewed the radiological images as listed and agreed with the findings in the report. IR IVC FILTER PLMT / S&I /IMG GUID/MOD SED Result Date: 12/14/2023 INDICATION: Acute hemorrhage in a patient with extensive left lower extremity DVT involving the external iliac vein, external iliac vein, femoral vein. With an inability to anticoagulate, an IVC filter was discussed as a temporary method a per venting pulmonary emboli. EXAM: IVC filter placement MEDICATIONS: None. ANESTHESIA/SEDATION: Moderate (conscious) sedation was employed during this procedure. A total of Versed  0 mg and Fentanyl  150 mcg was administered intravenously by the radiology nurse. Total intra-service moderate Sedation Time: 60 minutes. The patient's level of consciousness and vital signs were monitored continuously by radiology nursing throughout the procedure under my direct supervision. FLUOROSCOPY: Radiation Exposure Index (as provided by the fluoroscopic device): 346 mGy Kerma COMPLICATIONS: None immediate. PROCEDURE: Informed written consent was obtained from the patient after a thorough discussion of the procedural risks, benefits and alternatives. All questions were addressed. Maximal Sterile Barrier Technique was utilized including caps, mask, sterile gowns, sterile gloves, sterile drape, hand hygiene and skin antiseptic. A timeout was performed prior  to the initiation of the procedure. With patient in supine position, the right neck was prepped and draped in usual sterile fashion. Local anesthesia was achieved by infiltrating subcutaneous tissue overlying the right IJ with  1% lidocaine . Ultrasound demonstrated the right internal jugular vein to be anechoic and compressible indicating patency. The needle was advanced from the skin into the midpoint of the vessel under ultrasound guidance. Final image was obtained stored patient's permanent medical record. Access was exchanged for an 035 guidewire which was advanced under fluoroscopic guidance into the IVC. A 10 French sheath was then advanced over the guidewire and into the inferior vena cava. The catheter and sheath were then advanced under fluoroscopic guidance to the iliac bifurcation. The introducer and guidewire were removed and an inferior vena cavagram was performed through the sheath. The IVC is widely patent and of a size amenable to filter placement. The left and right renal veins are well seen and marked. [facility] filter was then advanced through the sheath and the sheath was partially retracted to a level inferior to the renal veins. The [facility] filter was then deployed as the sheath was retracted. A final venogram was performed demonstrating satisfactory position of the filter. The sheath was completely removed from the patient and hemostasis was achieved with direct compression. Sterile dressing applied. IMPRESSION: Satisfactory placement of a temporary Denali IVC filter. The patient will be followed up clinically in approximately 2-3 months for discussion of possible IVC filter retrieval. PLAN: This IVC filter is potentially retrievable. The patient will be assessed for filter retrieval by Interventional Radiology in approximately 8-12 weeks. Further recommendations regarding filter retrieval, continued surveillance or declaration of device permanence, will be made at that time. Electronically  Signed   By: Cordella Banner   On: 12/14/2023 19:30   IR US  Guide Vasc Access Right Result Date: 12/14/2023 INDICATION: Acute hemorrhage in a patient with extensive left lower extremity DVT involving the external iliac vein, external iliac vein, femoral vein. With an inability to anticoagulate, an IVC filter was discussed as a temporary method a per venting pulmonary emboli. EXAM: IVC filter placement MEDICATIONS: None. ANESTHESIA/SEDATION: Moderate (conscious) sedation was employed during this procedure. A total of Versed  0 mg and Fentanyl  150 mcg was administered intravenously by the radiology nurse. Total intra-service moderate Sedation Time: 60 minutes. The patient's level of consciousness and vital signs were monitored continuously by radiology nursing throughout the procedure under my direct supervision. FLUOROSCOPY: Radiation Exposure Index (as provided by the fluoroscopic device): 346 mGy Kerma COMPLICATIONS: None immediate. PROCEDURE: Informed written consent was obtained from the patient after a thorough discussion of the procedural risks, benefits and alternatives. All questions were addressed. Maximal Sterile Barrier Technique was utilized including caps, mask, sterile gowns, sterile gloves, sterile drape, hand hygiene and skin antiseptic. A timeout was performed prior to the initiation of the procedure. With patient in supine position, the right neck was prepped and draped in usual sterile fashion. Local anesthesia was achieved by infiltrating subcutaneous tissue overlying the right IJ with 1% lidocaine . Ultrasound demonstrated the right internal jugular vein to be anechoic and compressible indicating patency. The needle was advanced from the skin into the midpoint of the vessel under ultrasound guidance. Final image was obtained stored patient's permanent medical record. Access was exchanged for an 035 guidewire which was advanced under fluoroscopic guidance into the IVC. A 10 French sheath was then  advanced over the guidewire and into the inferior vena cava. The catheter and sheath were then advanced under fluoroscopic guidance to the iliac bifurcation. The introducer and guidewire were removed and an inferior vena cavagram was performed through the sheath. The IVC is widely patent and of a size amenable to filter placement.  The left and right renal veins are well seen and marked. The [facility] filter was then advanced through the sheath and the sheath was partially retracted to a level inferior to the renal veins. The [facility] filter was then deployed as the sheath was retracted. A final venogram was performed demonstrating satisfactory position of the filter. The sheath was completely removed from the patient and hemostasis was achieved with direct compression. Sterile dressing applied. IMPRESSION: Satisfactory placement of a temporary Denali IVC filter. The patient will be followed up clinically in approximately 2-3 months for discussion of possible IVC filter retrieval. PLAN: This IVC filter is potentially retrievable. The patient will be assessed for filter retrieval by Interventional Radiology in approximately 8-12 weeks. Further recommendations regarding filter retrieval, continued surveillance or declaration of device permanence, will be made at that time. Electronically Signed   By: Cordella Banner   On: 12/14/2023 19:30   IR EMBO ART  VEN HEMORR LYMPH EXTRAV  INC GUIDE ROADMAPPING Result Date: 12/14/2023 INDICATION: Postprocedure acute hemorrhage left pelvic sidewall. The patient also has a documented acute lower extremity DVT involving the left external iliac, distal common iliac, common femoral vein, and superficial femoral vein with and inability to anticoagulate secondary to the hemorrhage. EXAM: Angiogram with embolization MEDICATIONS: None. The antibiotic was administered within 1 hour of the procedure ANESTHESIA/SEDATION: Moderate (conscious) sedation was employed during this procedure. A total  of Versed  0 mg and Fentanyl  150 mcg was administered intravenously by the radiology nurse. Total intra-service moderate Sedation Time: 60 minutes. The patient's level of consciousness and vital signs were monitored continuously by radiology nursing throughout the procedure under my direct supervision. FLUOROSCOPY: Radiation Exposure Index (as provided by the fluoroscopic device): 346 mGy Kerma COMPLICATIONS: None immediate. PROCEDURE: Informed consent was obtained from the patient following explanation of the procedure, risks, benefits and alternatives. The patient understands, agrees and consents for the procedure. All questions were addressed. A time out was performed prior to the initiation of the procedure. Maximal barrier sterile technique utilized including caps, mask, sterile gowns, sterile gloves, large sterile drape, hand hygiene, and Betadine prep. In a supine position, the right groin was prepped draped usual sterile fashion. Local anesthesia was achieved with 1% lidocaine . Lidocaine  was infiltrated in the subcutaneous tissue overlying the right common femoral artery. Ultrasound demonstrated the right common femoral artery to be anechoic and pulse static cane patency. The needle was advanced from the skin to the midpoint of the lumen of the artery under ultrasound guidance. Final image was obtained stored in patient's permanent medical record. Access was exchanged over an 035 guidewire which was advanced under fluoroscopy to the abdominal aorta. Using a combination of standard catheter guidewire technique, the catheter and guidewire were advanced to the abdominal aorta and then into the left common iliac artery. From this position, access was exchanged for a 6 [place] Ansel sheath. Through this sheath, the internal iliac artery was selected. In internal iliac angiogram was performed from this position. The anterior and posterior divisions of the internal iliac artery are well visualized. There is a ilial  lumbar branch that originates from the internal iliac artery that has an active hemorrhage/pseudoaneurysm. Using standard catheter and guidewire technique in coaxial fashion common 014 guidewire and catheter were advanced through the angled glide catheter from the 6 French sheath in into the ilial lumbar branch originating from the internal iliac artery. Guidewire was removed once the artery was engaged and a dedicated angiogram was performed from this position demonstrating the pseudoaneurysm/active  hemorrhage as well as the distal branching and anatomy of this vessel. Using this angiogram, the catheter and guidewire were advanced beyond the bleed. Slowly withdrawing the microcatheter as detachable coils were then used to selectively embolize this vessel. Once the origin of the hemorrhage was crossed with embolic material, a follow-up angiogram was performed demonstrating complete occlusion of this artery. No further active hemorrhage identified. Catheter and guidewire were removed. The sheath was withdrawn to the ipsilateral side and an angiogram of the right common femoral artery was performed. The patient demonstrates variant anatomy not favorable to a closure device. Hemostasis was achieved with compression. IMPRESSION: Acute hemorrhage identified in the left ilial lumbar branch successfully embolized with detachable coils. Electronically Signed   By: Cordella Banner   On: 12/14/2023 19:27   IR Angiogram Pelvis Selective Or Supraselective Result Date: 12/14/2023 INDICATION: Postprocedure acute hemorrhage left pelvic sidewall. The patient also has a documented acute lower extremity DVT involving the left external iliac, distal common iliac, common femoral vein, and superficial femoral vein with and inability to anticoagulate secondary to the hemorrhage. EXAM: Angiogram with embolization MEDICATIONS: None. The antibiotic was administered within 1 hour of the procedure ANESTHESIA/SEDATION: Moderate (conscious)  sedation was employed during this procedure. A total of Versed  0 mg and Fentanyl  150 mcg was administered intravenously by the radiology nurse. Total intra-service moderate Sedation Time: 60 minutes. The patient's level of consciousness and vital signs were monitored continuously by radiology nursing throughout the procedure under my direct supervision. FLUOROSCOPY: Radiation Exposure Index (as provided by the fluoroscopic device): 346 mGy Kerma COMPLICATIONS: None immediate. PROCEDURE: Informed consent was obtained from the patient following explanation of the procedure, risks, benefits and alternatives. The patient understands, agrees and consents for the procedure. All questions were addressed. A time out was performed prior to the initiation of the procedure. Maximal barrier sterile technique utilized including caps, mask, sterile gowns, sterile gloves, large sterile drape, hand hygiene, and Betadine prep. In a supine position, the right groin was prepped draped usual sterile fashion. Local anesthesia was achieved with 1% lidocaine . Lidocaine  was infiltrated in the subcutaneous tissue overlying the right common femoral artery. Ultrasound demonstrated the right common femoral artery to be anechoic and pulse static cane patency. The needle was advanced from the skin to the midpoint of the lumen of the artery under ultrasound guidance. Final image was obtained stored in patient's permanent medical record. Access was exchanged over an 035 guidewire which was advanced under fluoroscopy to the abdominal aorta. Using a combination of standard catheter guidewire technique, the catheter and guidewire were advanced to the abdominal aorta and then into the left common iliac artery. From this position, access was exchanged for a 6 [place] Ansel sheath. Through this sheath, the internal iliac artery was selected. In internal iliac angiogram was performed from this position. The anterior and posterior divisions of the internal  iliac artery are well visualized. There is a ilial lumbar branch that originates from the internal iliac artery that has an active hemorrhage/pseudoaneurysm. Using standard catheter and guidewire technique in coaxial fashion common 014 guidewire and catheter were advanced through the angled glide catheter from the 6 French sheath in into the ilial lumbar branch originating from the internal iliac artery. Guidewire was removed once the artery was engaged and a dedicated angiogram was performed from this position demonstrating the pseudoaneurysm/active hemorrhage as well as the distal branching and anatomy of this vessel. Using this angiogram, the catheter and guidewire were advanced beyond the bleed. Slowly withdrawing  the microcatheter as detachable coils were then used to selectively embolize this vessel. Once the origin of the hemorrhage was crossed with embolic material, a follow-up angiogram was performed demonstrating complete occlusion of this artery. No further active hemorrhage identified. Catheter and guidewire were removed. The sheath was withdrawn to the ipsilateral side and an angiogram of the right common femoral artery was performed. The patient demonstrates variant anatomy not favorable to a closure device. Hemostasis was achieved with compression. IMPRESSION: Acute hemorrhage identified in the left ilial lumbar branch successfully embolized with detachable coils. Electronically Signed   By: Cordella Banner   On: 12/14/2023 19:27   IR Angiogram Selective Each Additional Vessel Result Date: 12/14/2023 INDICATION: Postprocedure acute hemorrhage left pelvic sidewall. The patient also has a documented acute lower extremity DVT involving the left external iliac, distal common iliac, common femoral vein, and superficial femoral vein with and inability to anticoagulate secondary to the hemorrhage. EXAM: Angiogram with embolization MEDICATIONS: None. The antibiotic was administered within 1 hour of the  procedure ANESTHESIA/SEDATION: Moderate (conscious) sedation was employed during this procedure. A total of Versed  0 mg and Fentanyl  150 mcg was administered intravenously by the radiology nurse. Total intra-service moderate Sedation Time: 60 minutes. The patient's level of consciousness and vital signs were monitored continuously by radiology nursing throughout the procedure under my direct supervision. FLUOROSCOPY: Radiation Exposure Index (as provided by the fluoroscopic device): 346 mGy Kerma COMPLICATIONS: None immediate. PROCEDURE: Informed consent was obtained from the patient following explanation of the procedure, risks, benefits and alternatives. The patient understands, agrees and consents for the procedure. All questions were addressed. A time out was performed prior to the initiation of the procedure. Maximal barrier sterile technique utilized including caps, mask, sterile gowns, sterile gloves, large sterile drape, hand hygiene, and Betadine prep. In a supine position, the right groin was prepped draped usual sterile fashion. Local anesthesia was achieved with 1% lidocaine . Lidocaine  was infiltrated in the subcutaneous tissue overlying the right common femoral artery. Ultrasound demonstrated the right common femoral artery to be anechoic and pulse static cane patency. The needle was advanced from the skin to the midpoint of the lumen of the artery under ultrasound guidance. Final image was obtained stored in patient's permanent medical record. Access was exchanged over an 035 guidewire which was advanced under fluoroscopy to the abdominal aorta. Using a combination of standard catheter guidewire technique, the catheter and guidewire were advanced to the abdominal aorta and then into the left common iliac artery. From this position, access was exchanged for a 6 [place] sheath. Through this sheath, the internal iliac artery was selected. In internal iliac angiogram was performed from this position.  The anterior and posterior divisions of the internal iliac artery are well visualized. There is a ilial lumbar branch that originates from the internal iliac artery that has an active hemorrhage/pseudoaneurysm. Using standard catheter and guidewire technique in coaxial fashion common 014 guidewire and catheter were advanced through the angled glide catheter from the 6 French sheath in into the ilial lumbar branch originating from the internal iliac artery. Guidewire was removed once the artery was engaged and a dedicated angiogram was performed from this position demonstrating the pseudoaneurysm/active hemorrhage as well as the distal branching and anatomy of this vessel. Using this angiogram, the catheter and guidewire were advanced beyond the bleed. Slowly withdrawing the microcatheter as detachable coils were then used to selectively embolize this vessel. Once the origin of the hemorrhage was crossed with embolic material, a follow-up  angiogram was performed demonstrating complete occlusion of this artery. No further active hemorrhage identified. Catheter and guidewire were removed. The sheath was withdrawn to the ipsilateral side and an angiogram of the right common femoral artery was performed. The patient demonstrates variant anatomy not favorable to a closure device. Hemostasis was achieved with compression. IMPRESSION: Acute hemorrhage identified in the left ilial lumbar branch successfully embolized with detachable coils. Electronically Signed   By: Cordella Banner   On: 12/14/2023 19:27   CT Angio Abd/Pel w/ and/or w/o Result Date: 12/14/2023 CLINICAL DATA:  Follow-up retroperitoneal hematoma EXAM: CTA ABDOMEN AND PELVIS WITHOUT AND WITH CONTRAST TECHNIQUE: Multidetector CT imaging of the abdomen and pelvis was performed using the standard protocol during bolus administration of intravenous contrast. Multiplanar reconstructed images and MIPs were obtained and reviewed to evaluate the vascular anatomy.  RADIATION DOSE REDUCTION: This exam was performed according to the departmental dose-optimization program which includes automated exposure control, adjustment of the mA and/or kV according to patient size and/or use of iterative reconstruction technique. CONTRAST:  75mL OMNIPAQUE  IOHEXOL  350 MG/ML SOLN COMPARISON:  CT 12/14/2023, 12/10/2023 FINDINGS: VASCULAR Aorta: Non contrasted images demonstrate no acute intramural hematoma. Moderate aortic atherosclerosis. No aneurysm or dissection. No significant stenosis or occlusion. Celiac: Mild to moderate origin stenosis. Distal patency with no evidence for aneurysm dissection or occlusion SMA: Patent without evidence of aneurysm, dissection, vasculitis or significant stenosis. Renals: Both renal arteries are patent without evidence of aneurysm, dissection, vasculitis, fibromuscular dysplasia or significant stenosis. IMA: Patent without evidence of aneurysm, dissection, vasculitis or significant stenosis. Inflow: Moderate aortic atherosclerosis. Common iliac vessels show no evidence for aneurysm, dissection or high-grade stenosis. Patent external iliac vessels. Globular contrast collection within or adjacent to the left psoas muscle, at the level of upper sacrum, this measures 14 x 12 mm on series 5, image 156 and diminishes on portal venous phase images. 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 Proximal Outflow: Suboptimally evaluated due to limited contrast enhancement but grossly patent to the common femoral vessels. Veins: Patent portal and splenic veins. Review of the MIP images confirms the above findings. NON-VASCULAR Lower chest: Lung bases demonstrate bandlike atelectasis in the right lower lobe. Hepatobiliary: No focal liver abnormality is seen. No gallstones, gallbladder wall thickening, or biliary dilatation. Pancreas: Unremarkable. No pancreatic ductal dilatation or  surrounding inflammatory changes. Spleen: Normal in size without focal abnormality. Adrenals/Urinary Tract: Adrenal glands are normal. Excreted contrast within the renal collecting systems. Contrast in the urinary bladder Stomach/Bowel: Stomach is nonenlarged. No dilated small bowel. No acute bowel wall thickening. Lymphatic: No suspicious lymph nodes Reproductive: Hysterectomy.  No adnexal mass Other: Redemonstrated large left retroperitoneal hematoma, involves left psoas and left pelvic sidewall. Hyperdense components consistent with recent hematoma. This is increased compared to the CT from 07/02. Hyperdense presacral fluid stable compared to earlier today. Musculoskeletal: Linear biopsy tract within the left sacrum, series 5, image 156. IMPRESSION: 1. Redemonstrated large left retroperitoneal hematoma, that involves the left psoas muscle and left pelvic sidewall with hyperdense areas suspicious for recent hemorrhage. Positive for 14 x 12 mm pseudoaneurysm in the left retroperitoneum, posterior to the left psoas muscle at the level of sacral biopsy tract. Pseudoaneurysm arises from a small branch vessel of the left internal iliac artery. Critical Value/emergent results were called by telephone at the time of interpretation on 12/14/2023 at 6:27 pm to provider [facility] , who verbally acknowledged these  results. Electronically Signed   By: Luke Bun M.D.   On: 12/14/2023 18:27   CT ABDOMEN PELVIS W CONTRAST Result Date: 12/14/2023 CLINICAL DATA:  Evaluate retroperitoneal bleed. EXAM: CT ABDOMEN AND PELVIS WITH CONTRAST TECHNIQUE: Multidetector CT imaging of the abdomen and pelvis was performed using the standard protocol following bolus administration of intravenous contrast. RADIATION DOSE REDUCTION: This exam was performed according to the departmental dose-optimization program which includes automated exposure control, adjustment of the mA and/or kV according to patient size and/or use of iterative  reconstruction technique. CONTRAST:  OMNIPAQUE  IOHEXOL  300 MG/ML  SOLN COMPARISON:  12/10/2023 FINDINGS: Lower chest: Subsegmental atelectasis noted in both lung bases. No pleural effusion or consolidative change. Hepatobiliary: No focal liver abnormality is seen. No gallstones, gallbladder wall thickening, or biliary dilatation. Pancreas: Unremarkable. No pancreatic ductal dilatation or surrounding inflammatory changes. Spleen: Normal in size without focal abnormality. Adrenals/Urinary Tract: Normal adrenal glands. No nephrolithiasis, hydronephrosis or suspicious mass. Circumferential wall thickening of the bladder. No focal bladder abnormality. Stomach/Bowel: Stomach appears within normal limits. No dilated loops of large or small bowel. No bowel wall thickening, inflammation or distension. Vascular/Lymphatic: Aortic atherosclerosis. No enlarged abdominal or pelvic lymph nodes. Reproductive: Status post hysterectomy. No adnexal masses. Other: Again seen are changes of left retroperitoneal hematoma with extension into the left psoas muscle and left pelvic sidewall. Persistent hyperdense components within this area measuring up to 74 Hounsfield units. Increased component is identified within the left lower quadrant anterior to the left psoas muscle which measures 9.3 x 6.1 cm, image 63/2. Similar appearance of the component involving the left psoas muscle and left extraperitoneal space along the left pelvic sidewall. Increased fluid within the presacral soft tissue space is also similar to previous exam. Sub optimal visualization of potential pseudoaneurysm due to timing of contrast bolus. Musculoskeletal: Left sacral wing biopsy tract is again noted, image 64/2. IMPRESSION: 1. Again seen are changes of left retroperitoneal hematoma with extension into the left psoas muscle and left pelvic sidewall. There are areas of increased hyperdensity within the hematomas measuring up to 75 Hounsfield units which is  concerning for acute blood and active bleeding. Increased component is identified within the left lower quadrant anterior to the asymmetrically enlarged left psoas muscle which measures 9.3 x 6.1 cm. Similar volume of the component involving the left psoas muscle and left extraperitoneal space along the left pelvic sidewall. 2. Suboptimal visualization of potential pseudoaneurysm due to timing of contrast bolus. 3. Circumferential wall thickening of the bladder. Correlate for any clinical signs or symptoms of cystitis. 4.  Aortic Atherosclerosis (ICD10-I70.0). Electronically Signed   By: Waddell Calk M.D.   On: 12/14/2023 13:01   VAS US  LOWER EXTREMITY VENOUS (DVT) Result Date: 12/14/2023  Lower Venous DVT Study Patient Name:  [name] [facility]  Date of Exam:   12/13/2023 Medical Rec #: [id number]         Accession #:    [id number] Date of Birth: 1955-11-04          Patient Gender: F Patient Age:   89 years Exam Location:  [facility] Procedure:      VAS US  LOWER EXTREMITY VENOUS (DVT) Referring Phys: RAVI PAHWANI --------------------------------------------------------------------------------  Indications: Edema. Other Indications: Patient had bone marrow biopsy 12/10/2023 with ensuing lower                    back pain that radiated to groin and thigh. Risk Factors: Left psoas intramuscular hematoma with associated  at least moderate volume retroperitoneal and pelvic hematoma. Limitations: Poor ultrasound/tissue interface and edema. Comparison Study: No prior study on file Performing Technologist: Alberta Lis RVS  Examination Guidelines: A complete evaluation includes B-mode imaging, spectral Doppler, color Doppler, and power Doppler as needed of all accessible portions of each vessel. Bilateral testing is considered an integral part of a complete examination. Limited examinations for reoccurring indications may be performed as noted. The reflux portion of the exam is performed with the patient in reverse  Trendelenburg.  +---------+---------------+---------+-----------+----------+--------------+ RIGHT    CompressibilityPhasicitySpontaneityPropertiesThrombus Aging +---------+---------------+---------+-----------+----------+--------------+ CFV      Full           Yes      Yes                                 +---------+---------------+---------+-----------+----------+--------------+ SFJ      Full                                                        +---------+---------------+---------+-----------+----------+--------------+ FV Prox  Full           Yes      Yes                                 +---------+---------------+---------+-----------+----------+--------------+ FV Mid   Full                                                        +---------+---------------+---------+-----------+----------+--------------+ FV DistalFull           Yes      Yes                                 +---------+---------------+---------+-----------+----------+--------------+ PFV      Full           Yes      Yes                                 +---------+---------------+---------+-----------+----------+--------------+ POP      Full           Yes      Yes                                 +---------+---------------+---------+-----------+----------+--------------+ PTV      Full                                                        +---------+---------------+---------+-----------+----------+--------------+ PERO     Full                                                        +---------+---------------+---------+-----------+----------+--------------+   +----------+---------------+---------+-----------+----------+--------------+  LEFT      CompressibilityPhasicitySpontaneityPropertiesThrombus Aging +----------+---------------+---------+-----------+----------+--------------+ CFV       None           No       No                   Acute           +----------+---------------+---------+-----------+----------+--------------+ SFJ       Partial        No       No                   Acute          +----------+---------------+---------+-----------+----------+--------------+ FV Prox   Partial        Yes      No                   Acute          +----------+---------------+---------+-----------+----------+--------------+ FV Mid    Full                                                        +----------+---------------+---------+-----------+----------+--------------+ FV Distal Full                                                        +----------+---------------+---------+-----------+----------+--------------+ PFV       Partial        No       No                   Acute          +----------+---------------+---------+-----------+----------+--------------+ POP       Full           Yes      No                                  +----------+---------------+---------+-----------+----------+--------------+ PTV       None                                         Acute          +----------+---------------+---------+-----------+----------+--------------+ PERO      None                                         Acute          +----------+---------------+---------+-----------+----------+--------------+ Gastroc   Full           No       No                                  +----------+---------------+---------+-----------+----------+--------------+ EIV                      No       No  Acute          +----------+---------------+---------+-----------+----------+--------------+ CIV distal               No       No                   Acute          +----------+---------------+---------+-----------+----------+--------------+ Unable to visualize mid to distal common iliac vein. The IVC is patent.    Summary: RIGHT: - There is no evidence of deep vein thrombosis in the lower extremity.  - No cystic  structure found in the popliteal fossa.  LEFT: - Findings consistent with acute deep vein thrombosis involving the left external iliac, distal common iliac, common femoral vein, SF junction, left femoral vein, left proximal profunda vein, left posterior tibial veins, and left peroneal veins.  - No cystic structure found in the popliteal fossa. - Unable to visualize mid to distal common iliac vein. The IVC is patent. Unable to place order for IVC/Iliac duplex, added scan to LEV study.  *See table(s) above for measurements and observations. Electronically signed by Lonni Gaskins MD on 12/14/2023 at 12:09:18 PM.    Final    CT ABDOMEN PELVIS W CONTRAST Result Date: 12/10/2023 CLINICAL DATA:  Abdominal trauma, penetrating EXAM: CT ABDOMEN AND PELVIS WITH CONTRAST TECHNIQUE: Multidetector CT imaging of the abdomen and pelvis was performed using the standard protocol following bolus administration of intravenous contrast. RADIATION DOSE REDUCTION: This exam was performed according to the departmental dose-optimization program which includes automated exposure control, adjustment of the mA and/or kV according to patient size and/or use of iterative reconstruction technique. CONTRAST:  OMNIPAQUE  IOHEXOL  300 MG/ML  SOLN COMPARISON:  CT lumbar spine 12/10/2023 FINDINGS: Lower chest: No acute abnormality.  Coronary artery calcification. Hepatobiliary: Not enlarged. No focal lesion. No laceration or subcapsular hematoma. The gallbladder is otherwise unremarkable with no radio-opaque gallstones. No biliary ductal dilatation. Pancreas: Normal pancreatic contour. No main pancreatic duct dilatation. Spleen: Not enlarged. No focal lesion. No laceration, subcapsular hematoma, or vascular injury. Adrenals/Urinary Tract: No nodularity bilaterally. Bilateral kidneys enhance symmetrically. No hydronephrosis. No contusion, laceration, or subcapsular hematoma. No injury to the vascular structures or collecting systems. No  hydroureter. 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 Otherwise the urinary bladder is unremarkable. Stomach/Bowel: No small or large bowel wall thickening or dilatation. Colonic diverticulosis. The appendix is unremarkable. Vasculature/Lymphatics: No abdominal aorta or iliac aneurysm. No active contrast extravasation or pseudoaneurysm. No abdominal, pelvic, inguinal lymphadenopathy. Reproductive: Normal. Other: Stable left retroperitoneal and left psoas intramuscular hematoma extending to the left pelvis, space of Retzius, and inguinal region. No definite associated active hemorrhage with limited evaluation due to timing of contrast. No simple free fluid ascites. No pneumoperitoneum. No organized fluid collection. Musculoskeletal: No significant soft tissue hematoma. No acute pelvic fracture. No spinal fracture. Grade 1 anterolisthesis of L5 on S1. Left sacral ala biopsy site noted (2:56). Other ports and devices: None. IMPRESSION: 1. Stable left retroperitoneal and left psoas intramuscular hematoma extending to the left pelvis, space of Retzius, and inguinal region. No definite evidence of associated active extravasation. 2. Other imaging findings of potential clinical significance: Colonic diverticulosis with no acute diverticulitis. Aortic Atherosclerosis (ICD10-I70.0). Electronically Signed   By: Morgane  Naveau M.D.   On: 12/10/2023 23:17   CT Lumbar Spine Wo Contrast Result Date: 12/10/2023 CLINICAL DATA:  Myelopathy, acute, lumbar spine Low back pain radiating down L leg, weakness L leg after bone marrow  biopsy Pt bib family for from advice from cancer center pain after bone marrow biopsy. Lower back pain that radiates down groin the left leg. Nausea and vomiting as well. Issues with biopsy today had to attempt three times. EXAM: CT LUMBAR SPINE WITHOUT CONTRAST TECHNIQUE: Multidetector CT imaging of the lumbar spine was performed without intravenous contrast  administration. Multiplanar CT image reconstructions were also generated. RADIATION DOSE REDUCTION: This exam was performed according to the departmental dose-optimization program which includes automated exposure control, adjustment of the mA and/or kV according to patient size and/or use of iterative reconstruction technique. COMPARISON:  None Available. FINDINGS: Segmentation: 5 lumbar type vertebrae. Alignment: Grade 1 anterolisthesis of L5 on S1. Vertebrae: Multilevel mild degenerative changes of the spine. No acute fracture or focal pathologic process. Paraspinal and other soft tissues: Negative. Disc levels: Intervertebral disc space maintained. Other: Atherosclerotic plaque. Asymmetrically enlarged left psoas muscle with associated retroperitoneal hematoma and fat stranding extending down to the pelvis and presacral space with as well as left pelvic sidewall. Partially visualized 6.2 x 3.8 cm left pelvic sidewall hematoma with markedly limited evaluation on this noncontrast study. Left sacral ala linear lucencies consistent with recent biopsy. IMPRESSION: 1. Left psoas muscle hematoma with associated at least moderate volume retroperitoneal and pelvic hematoma. Partially visualized 6.2 x 3.8 cm left pelvic sidewall hematoma with markedly limited evaluation on this noncontrast study. Recommend further evaluation with a trauma scan with intravenous contrast of the abdomen and pelvis including delayed imaging of the abdomen and pelvis (please make note in comments for CT tech to obtain delayed images of both the abdomen and pelvis). Finding consistent with traumatic injury in the setting of recent biopsy. 2. Left sacral ala linear lucencies consistent with recent biopsy. 3. No acute displaced fracture or traumatic listhesis of the lumbar spine. 4.  Aortic Atherosclerosis (ICD10-I70.0). Electronically Signed   By: Morgane  Naveau M.D.   On: 12/10/2023 21:37    All questions were answered. The patient knows to  call the clinic with any problems, questions or concerns. I spent 30 minutes in the care of this patient including H and P, review of records, counseling and coordination of care.     Amber Stalls, MD 01/02/2024 12:41 PM

## 2024-01-02 NOTE — Telephone Encounter (Signed)
 Left patient a vm regarding upcoming appointment

## 2024-01-05 ENCOUNTER — Other Ambulatory Visit: Payer: Self-pay | Admitting: Hematology and Oncology

## 2024-01-05 ENCOUNTER — Inpatient Hospital Stay

## 2024-01-05 ENCOUNTER — Telehealth: Payer: Self-pay | Admitting: Hematology and Oncology

## 2024-01-05 ENCOUNTER — Ambulatory Visit: Payer: Self-pay | Admitting: Hematology and Oncology

## 2024-01-05 DIAGNOSIS — D75839 Thrombocytosis, unspecified: Secondary | ICD-10-CM | POA: Diagnosis not present

## 2024-01-05 LAB — CBC WITH DIFFERENTIAL/PLATELET
Abs Immature Granulocytes: 0.03 K/uL (ref 0.00–0.07)
Basophils Absolute: 0.1 K/uL (ref 0.0–0.1)
Basophils Relative: 1 %
Eosinophils Absolute: 0.5 K/uL (ref 0.0–0.5)
Eosinophils Relative: 6 %
HCT: 34.6 % — ABNORMAL LOW (ref 36.0–46.0)
Hemoglobin: 10.9 g/dL — ABNORMAL LOW (ref 12.0–15.0)
Immature Granulocytes: 0 %
Lymphocytes Relative: 25 %
Lymphs Abs: 2.2 K/uL (ref 0.7–4.0)
MCH: 30.1 pg (ref 26.0–34.0)
MCHC: 31.5 g/dL (ref 30.0–36.0)
MCV: 95.6 fL (ref 80.0–100.0)
Monocytes Absolute: 0.8 K/uL (ref 0.1–1.0)
Monocytes Relative: 9 %
Neutro Abs: 5.3 K/uL (ref 1.7–7.7)
Neutrophils Relative %: 59 %
Platelets: 587 K/uL — ABNORMAL HIGH (ref 150–400)
RBC: 3.62 MIL/uL — ABNORMAL LOW (ref 3.87–5.11)
RDW: 14.9 % (ref 11.5–15.5)
WBC: 8.8 K/uL (ref 4.0–10.5)
nRBC: 0 % (ref 0.0–0.2)

## 2024-01-05 NOTE — Telephone Encounter (Signed)
 The patient called to reschedule appointments. The patient is aware of the changes made.

## 2024-01-05 NOTE — Telephone Encounter (Signed)
 Called the patient to inform her of the telephone visit being canceled and the in-person visit staying where it is. The patient is aware of all appointment changes.

## 2024-01-06 ENCOUNTER — Other Ambulatory Visit: Payer: Self-pay | Admitting: Hematology and Oncology

## 2024-01-06 ENCOUNTER — Other Ambulatory Visit: Payer: Self-pay | Admitting: *Deleted

## 2024-01-06 MED ORDER — ENOXAPARIN SODIUM 80 MG/0.8ML IJ SOSY: 80.0000 mg | PREFILLED_SYRINGE | Freq: Two times a day (BID) | INTRAMUSCULAR | 0 refills | Status: DC

## 2024-01-06 NOTE — Progress Notes (Signed)
 Discussed with Dr Lanis from vascular surgery about vascular ultrasound results Given her clot diagnosed early July, despite the most recent Vasc US  results, he suggests we consider 3 months of anticoagulation Discussed with pt, she is agreeable. She will continue lovenox  for a week, if CBC is stable, no further evidence of bleed, we will transition to DOAC.  [name]

## 2024-01-07 ENCOUNTER — Other Ambulatory Visit: Payer: Self-pay | Admitting: *Deleted

## 2024-01-07 ENCOUNTER — Other Ambulatory Visit (HOSPITAL_COMMUNITY): Payer: Self-pay

## 2024-01-07 MED ORDER — ENOXAPARIN SODIUM 80 MG/0.8ML IJ SOSY
80.0000 mg | PREFILLED_SYRINGE | Freq: Two times a day (BID) | INTRAMUSCULAR | 0 refills | Status: DC
  Filled 2024-01-07: qty 16, 10d supply, fill #0

## 2024-01-08 ENCOUNTER — Other Ambulatory Visit (HOSPITAL_COMMUNITY): Payer: Self-pay

## 2024-01-08 ENCOUNTER — Telehealth: Admitting: Hematology and Oncology

## 2024-01-13 ENCOUNTER — Telehealth: Payer: Self-pay

## 2024-01-13 NOTE — Telephone Encounter (Signed)
 Pt verbally confirmed appt for 8/6

## 2024-01-14 ENCOUNTER — Inpatient Hospital Stay

## 2024-01-14 ENCOUNTER — Inpatient Hospital Stay: Attending: Hematology and Oncology | Admitting: Hematology and Oncology

## 2024-01-14 ENCOUNTER — Other Ambulatory Visit (HOSPITAL_COMMUNITY): Payer: Self-pay

## 2024-01-14 VITALS — BP 126/58 | HR 73 | Temp 98.9°F | Resp 17 | Wt 184.5 lb

## 2024-01-14 DIAGNOSIS — D75839 Thrombocytosis, unspecified: Secondary | ICD-10-CM

## 2024-01-14 DIAGNOSIS — I82402 Acute embolism and thrombosis of unspecified deep veins of left lower extremity: Secondary | ICD-10-CM | POA: Insufficient documentation

## 2024-01-14 DIAGNOSIS — R197 Diarrhea, unspecified: Secondary | ICD-10-CM | POA: Insufficient documentation

## 2024-01-14 DIAGNOSIS — I82412 Acute embolism and thrombosis of left femoral vein: Secondary | ICD-10-CM | POA: Diagnosis not present

## 2024-01-14 DIAGNOSIS — Z79899 Other long term (current) drug therapy: Secondary | ICD-10-CM | POA: Diagnosis not present

## 2024-01-14 DIAGNOSIS — D473 Essential (hemorrhagic) thrombocythemia: Secondary | ICD-10-CM | POA: Insufficient documentation

## 2024-01-14 DIAGNOSIS — Z87891 Personal history of nicotine dependence: Secondary | ICD-10-CM | POA: Insufficient documentation

## 2024-01-14 DIAGNOSIS — Z7901 Long term (current) use of anticoagulants: Secondary | ICD-10-CM | POA: Insufficient documentation

## 2024-01-14 LAB — CBC WITH DIFFERENTIAL/PLATELET
Abs Immature Granulocytes: 0.02 K/uL (ref 0.00–0.07)
Basophils Absolute: 0.1 K/uL (ref 0.0–0.1)
Basophils Relative: 1 %
Eosinophils Absolute: 0.4 K/uL (ref 0.0–0.5)
Eosinophils Relative: 5 %
HCT: 37 % (ref 36.0–46.0)
Hemoglobin: 11.9 g/dL — ABNORMAL LOW (ref 12.0–15.0)
Immature Granulocytes: 0 %
Lymphocytes Relative: 26 %
Lymphs Abs: 2 K/uL (ref 0.7–4.0)
MCH: 30.6 pg (ref 26.0–34.0)
MCHC: 32.2 g/dL (ref 30.0–36.0)
MCV: 95.1 fL (ref 80.0–100.0)
Monocytes Absolute: 0.6 K/uL (ref 0.1–1.0)
Monocytes Relative: 9 %
Neutro Abs: 4.4 K/uL (ref 1.7–7.7)
Neutrophils Relative %: 59 %
Platelets: 460 K/uL — ABNORMAL HIGH (ref 150–400)
RBC: 3.89 MIL/uL (ref 3.87–5.11)
RDW: 14.9 % (ref 11.5–15.5)
WBC: 7.4 K/uL (ref 4.0–10.5)
nRBC: 0 % (ref 0.0–0.2)

## 2024-01-14 MED ORDER — APIXABAN 5 MG PO TABS
5.0000 mg | ORAL_TABLET | Freq: Two times a day (BID) | ORAL | 2 refills | Status: DC
Start: 2024-01-14 — End: 2024-02-10
  Filled 2024-01-14: qty 60, 30d supply, fill #0

## 2024-01-14 NOTE — Progress Notes (Signed)
  [facility] CONSULT NOTE  Patient Care Team: Leonel Cole, MD as PCP - General (Family Medicine)  CHIEF COMPLAINTS/PURPOSE OF CONSULTATION:  Essential thrombocytosis.  ASSESSMENT & PLAN:   Assessment and Plan Assessment & Plan Thrombocytosis Platelet count reduced to 460000 with anagrelide . No significant side effects reported. Anagrelide  chosen for its specific action on megakaryocytes and lesser effect on hemoglobin. - Continue anagrelide . - Check GoodRx for medication pricing. - Repeat blood work in one week to monitor hemoglobin and platelet levels.  Anemia Current hemoglobin level at 9.9, slight decrease possibly due to anagrelide . - Repeat blood work in one week to monitor hemoglobin levels.  Deep vein thrombosis (DVT) Left leg swelling reappeared, decreases with elevation. Not on anticoagulation due to previous retroperitoneal bleed. IVC filter in place. - Order vascular ultrasound to assess DVT status. - Advise to limit activity and elevate the leg over the weekend.  Travel and Activity Restrictions Advised against international travel due to recent medical conditions. Domestic travel with precautions encouraged. - Provide a letter to the insurance company regarding travel restrictions. - Encourage domestic travel with precautions: frequent movement, hydration. - Advise to start water exercises slowly and monitor response.   HISTORY OF PRESENTING ILLNESS:  [name] 68 y.o. female is here because of thrombocytosis.  Discussed the use of AI scribe software for clinical note transcription with the patient, who gave verbal consent to proceed.  History of Present Illness    History of Present Illness  [name] is a 68 year old female who presents for follow-up regarding her ET and recent DVT  She experiences leg swelling, particularly in the leg where she had a bone marrow procedure and coils placed. The swelling increased yesterday but  decreased after elevating her leg overnight. The leg is described as aching and swollen, similar to her experience in the hospital. She has not been on a blood thinner due to a previous retroperitoneal bleed.  She has a history of elevated platelet counts, with a recent count of 1.2 million, which decreased to 1.0 million after starting anagrelide  last Thursday, today is 729K .   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 She is concerned about the swelling and its implications for her health.  Rest of the pertinent 10 point ROS reviewed and neg.  All other systems were reviewed with the patient and are negative.  MEDICAL HISTORY:  Past Medical History:  Diagnosis Date   Anxiety    Arthritis    Atrophic vaginitis    Depression    GERD (gastroesophageal reflux disease)    GERD (gastroesophageal reflux disease)    Hypertension    Osteoarthritis    Seasonal allergies    Sleep apnea     SURGICAL HISTORY: Past Surgical History:  Procedure Laterality Date    fistula repair x 3  10 yrs ago   ABDOMINAL HYSTERECTOMY  yrs ago   COLONOSCOPY     IR ANGIOGRAM PELVIS SELECTIVE OR SUPRASELECTIVE  12/14/2023   IR ANGIOGRAM SELECTIVE EACH ADDITIONAL VESSEL  12/14/2023   IR EMBO ART  VEN HEMORR LYMPH EXTRAV  INC GUIDE ROADMAPPING  12/14/2023   IR IVC FILTER PLMT / S&I /IMG GUID/MOD SED  12/14/2023   IR US  GUIDE VASC ACCESS RIGHT  12/14/2023   JOINT REPLACEMENT  2012   right  right knee arthroscopic surgery  yrs ago   right rotator cuff repair  8 yrs    TOTAL KNEE ARTHROPLASTY  04/14/2012   Procedure: TOTAL KNEE ARTHROPLASTY;  Surgeon: Donnice JONETTA Car, MD;  Location: [facility];  Service: Orthopedics;  Laterality: Left;    SOCIAL HISTORY: Social History   Socioeconomic History   Marital status: Married    Spouse name: Not on file   Number  of children: Not on file   Years of education: Not on file   Highest education level: Not on file  Occupational History   Not on file  Tobacco Use   Smoking status: Former    Current packs/day: 0.00    Average packs/day: 0.3 packs/day for 20.0 years (5.0 ttl pk-yrs)    Types: Cigarettes    Start date: 06/10/1986    Quit date: 06/10/2006    Years since quitting: 17.6   Smokeless tobacco: Never  Vaping Use   Vaping status: Never Used  Substance and Sexual Activity   Alcohol use: Yes    Alcohol/week: 2.0 standard drinks of alcohol    Types: 2 Glasses of wine per week   Drug use: Yes    Types: Marijuana    Comment: marijuan use 30 yrs ago, none current   Sexual activity: Not Currently  Other Topics Concern   Not on file  Social History Narrative   Not on file   Social Drivers of Health   Financial Resource Strain: Not on file  Food Insecurity: No Food Insecurity (12/11/2023)   Hunger Vital Sign    Worried About Running Out of Food in the Last Year: Never true    Ran Out of Food in the Last Year: Never true  Transportation Needs: No Transportation Needs (12/11/2023)   PRAPARE - Administrator, Civil Service (Medical): No    Lack of Transportation (Non-Medical): No  Physical Activity: Not on file  Stress: Not on file  Social Connections: Socially Integrated (12/11/2023)   Social Connection and Isolation Panel    Frequency of Communication with Friends and Family: More than three times a week    Frequency of Social Gatherings with Friends and Family: More than three times a week    Attends Religious Services: More than 4 times per year    Active Member of [employer] or Organizations: Yes    Attends Banker Meetings: 1 to 4 times per year    Marital Status: Married  Catering manager Violence: Not At Risk (12/11/2023)   Humiliation, Afraid, Rape, and Kick questionnaire    Fear of Current or Ex-Partner: No    Emotionally Abused: No    Physically Abused: No     Sexually Abused: No    FAMILY HISTORY: Family History  Problem Relation Age of Onset   Diabetes Maternal Grandfather     ALLERGIES:  is allergic to morphine and codeine and povidone.  MEDICATIONS:  Current Outpatient Medications  Medication Sig Dispense Refill   ALPRAZolam  (XANAX ) 0.25 MG tablet Take 0.25 mg by mouth as needed. anxiety     anagrelide  (AGRYLIN) 1 MG capsule Take 1 capsule (1 mg total) by mouth daily. 30 capsule 1   enoxaparin  (LOVENOX ) 80 MG/0.8ML injection Inject 0.8 mLs (80 mg total) into the skin every 12 (twelve) hours for 10 days. 16 mL 0   ferrous sulfate  324 MG TBEC Take 324 mg by mouth every other day.     FLUoxetine  (PROZAC ) 20 MG capsule Take 20 mg by  mouth every evening.  3   gabapentin  (NEURONTIN ) 300 MG capsule Take 1 capsule by mouth three times daily as needed 90 capsule 2   HYDROcodone -acetaminophen  (NORCO/VICODIN) 5-325 MG tablet Take 1-2 tablets by mouth every 6 (six) hours as needed for moderate pain (pain score 4-6). 30 tablet 0   KRILL OIL PO Take 1 capsule by mouth daily.     losartan (COZAAR) 50 MG tablet Take 50 mg by mouth every evening.     pantoprazole  (PROTONIX ) 40 MG tablet Take 40 mg by mouth daily.     Probiotic Product (PROBIOTIC DAILY PO) Take 1 capsule by mouth daily.     promethazine  (PHENERGAN ) 25 MG tablet Take 12.5-25 mg by mouth every 6 (six) hours as needed for nausea or vomiting.     No current facility-administered medications for this visit.     PHYSICAL EXAMINATION: ECOG PERFORMANCE STATUS: 0 - Asymptomatic  Vitals:   01/14/24 1244  BP: (!) 126/58  Pulse: 73  Resp: 17  Temp: 98.9 F (37.2 C)  SpO2: 98%   Filed Weights   01/14/24 1244  Weight: 184 lb 8 oz (83.7 kg)    GENERAL:alert, no distress and comfortable No palpable LN. Left leg swelling increased compare to right, but this could be from recent DVT CTA bilaterally No splenomegaly No LE edema.  LABORATORY DATA:  I have reviewed the data as  listed Lab Results  Component Value Date   WBC 7.4 01/14/2024   HGB 11.9 (L) 01/14/2024   HCT 37.0 01/14/2024   MCV 95.1 01/14/2024   PLT 460 (H) 01/14/2024     Chemistry      Component Value Date/Time   NA 136 12/15/2023 0455   K 4.3 12/15/2023 0455   CL 100 12/15/2023 0455   CO2 25 12/15/2023 0455   BUN 15 12/15/2023 0455   CREATININE 0.82 12/15/2023 0455   CREATININE 0.91 07/05/2023 1100      Component Value Date/Time   CALCIUM 8.7 (L) 12/15/2023 0455   ALKPHOS 65 12/10/2023 1946   AST 21 12/10/2023 1946   AST 16 07/05/2023 1100   ALT 17 12/10/2023 1946   ALT 15 07/05/2023 1100   BILITOT 0.8 12/10/2023 1946   BILITOT 0.4 07/05/2023 1100       RADIOGRAPHIC STUDIES: I have personally reviewed the radiological images as listed and agreed with the findings in the report. CT CARDIAC SCORING (SELF PAY ONLY) Addendum Date: 01/05/2024 ADDENDUM REPORT: 01/05/2024 21:29 EXAM: OVER-READ INTERPRETATION  CT CHEST The following report is an over-read performed by radiologist Dr. Suzen Dials of [facility] on 01/05/2024. This over-read does not include interpretation of cardiac or coronary anatomy or pathology. The coronary calcium score/coronary CTA interpretation by the cardiologist is attached. COMPARISON:  None. FINDINGS: Cardiovascular: There are no significant extracardiac vascular findings. Mediastinum/Nodes: There are no enlarged lymph nodes within the visualized mediastinum. Lungs/Pleura: There is no pleural effusion. A 6 mm pulmonary nodule is seen within the anterior aspect of the right lung base (axial CT image 22, CT series 4). Upper abdomen: No significant findings in the visualized upper abdomen. Musculoskeletal/Chest wall: No chest wall mass or suspicious osseous findings within the visualized chest. IMPRESSION: 6 mm right lower lobe pulmonary nodule. Non-contrast chest CT at 6-12 months is recommended. If the nodule is stable at time of repeat CT, then future  CT at 18-24 months (from today's scan) is considered optional for low-risk patients, but is recommended for high-risk patients. This recommendation follows the  consensus statement: Guidelines for Management of Incidental Pulmonary Nodules Detected on CT Images: From the Fleischner Society 2017; Radiology 2017; 284:228-243. Electronically Signed   By: Suzen Dials M.D.   On: 01/05/2024 21:29   Result Date: 01/05/2024 CLINICAL DATA:  Cardiovascular Disease Risk stratification EXAM: Coronary Calcium Score TECHNIQUE: A gated, non-contrast computed tomography scan of the heart was performed using 3mm slice thickness. Axial images were analyzed on a dedicated workstation. Calcium scoring of the coronary arteries was performed using the Agatston method. FINDINGS: Coronary arteries: Normal origins. Coronary Calcium Score: Left main: 0 Left anterior descending artery: 0 Left circumflex artery: 0 Right coronary artery: 0 Total: 0 Percentile: 0 Pericardium: Normal. Ascending Aorta: Normal caliber. Scattered atherosclerotic plaque. There is calcification of the aorta near the takeoff of the right coronary artery. Cannot determine if this extends into the RCA ostium on current imaging. Consider stress testing for further workup. Significant mitral annular calcifications. Recommend 2D echo for further assessment. Non-cardiac: See separate report from [facility]. IMPRESSION: Coronary calcium score of 0. This was 0 percentile for age-, race-, and sex-matched controls. There is calcification of the aorta near the takeoff of the right coronary artery. Cannot determine if this extends into the RCA ostium on current imaging. Consider stress testing for further workup. Significant mitral annular calcifications. Recommend 2D echo for further assessment. RECOMMENDATIONS: Coronary artery calcium (CAC) score is a strong predictor of incident coronary heart disease (CHD) and provides predictive information beyond traditional  risk factors. CAC scoring is reasonable to use in the decision to withhold, postpone, or initiate statin therapy in intermediate-risk or selected borderline-risk asymptomatic adults (age 65-75 years and LDL-C >=70 to <190 mg/dL) who do not have diabetes or established atherosclerotic cardiovascular disease (ASCVD).* In intermediate-risk (10-year ASCVD risk >=7.5% to <20%) adults or selected borderline-risk (10-year ASCVD risk >=5% to <7.5%) adults in whom a CAC score is measured for the purpose of making a treatment decision the following recommendations have been made: If CAC=0, it is reasonable to withhold statin therapy and reassess in 5 to 10 years, as long as higher risk conditions are absent (diabetes mellitus, family history of premature CHD in first degree relatives (males <55 years; females <65 years), cigarette smoking, or LDL >=190 mg/dL). If CAC is 1 to 99, it is reasonable to initiate statin therapy for patients >=65 years of age. If CAC is >=100 or >=75th percentile, it is reasonable to initiate statin therapy at any age. Cardiology referral should be considered for patients with CAC scores >=400 or >=75th percentile. *2018 AHA/ACC/AACVPR/AAPA/ABC/ACPM/ADA/AGS/APhA/ASPC/NLA/PCNA Guideline on the Management of Blood Cholesterol: A Report of the American College of Cardiology/American Heart Association Task Force on Clinical Practice Guidelines. J Am Coll Cardiol. 2019;73(24):3168-3209. Wilbert Bihari, MD Electronically Signed: By: Wilbert Bihari M.D. On: 01/05/2024 17:07   VAS US  LOWER EXTREMITY VENOUS (DVT) Result Date: 01/02/2024  Lower Venous DVT Study Patient Name:  ANNELISE MCCOY [facility]  Date of Exam:   01/02/2024 Medical Rec #: [id number]         Accession #:    [id number] Date of Birth: [birth year]          Patient Gender: F Patient Age:   39 years Exam Location:  [facility] Procedure:      VAS US  LOWER EXTREMITY VENOUS (DVT) Referring Phys: AMBER Naidelin Gugliotta  --------------------------------------------------------------------------------  Indications: Swelling.  Risk Factors: Cancer. Comparison Study: RIGHT:                    -  There is no evidence of deep vein thrombosis in the lower                   extremity.                    - No cystic structure found in the popliteal fossa.                    LEFT:                   - Findings consistent with acute deep vein thrombosis                   involving the left                   external iliac, distal common iliac, common femoral vein, SF                   junction,                   left femoral vein, left proximal profunda vein, left posterior                   tibial                   veins, and left peroneal veins.                    - No cystic structure found in the popliteal fossa.                   - Unable to visualize mid to distal common iliac vein. The IVC                   is patent.                   Unable to place order for IVC/Iliac duplex, added scan to LEV                   study. Performing Technologist: Cordella Collet RVT  Examination Guidelines: A complete evaluation includes B-mode imaging, spectral Doppler, color Doppler, and power Doppler as needed of all accessible portions of each vessel. Bilateral testing is considered an integral part of a complete examination. Limited examinations for reoccurring indications may be performed as noted. The reflux portion of the exam is performed with the patient in reverse Trendelenburg.  +-----+---------------+---------+-----------+----------+--------------+ RIGHTCompressibilityPhasicitySpontaneityPropertiesThrombus Aging +-----+---------------+---------+-----------+----------+--------------+ CFV  Full           Yes      Yes                                 +-----+---------------+---------+-----------+----------+--------------+   +---------+---------------+---------+-----------+----------+--------------+ LEFT      CompressibilityPhasicitySpontaneityPropertiesThrombus Aging +---------+---------------+---------+-----------+----------+--------------+ CFV      Full           Yes      Yes                                 +---------+---------------+---------+-----------+----------+--------------+ SFJ      Full                                                        +---------+---------------+---------+-----------+----------+--------------+  FV Prox  Full                                                        +---------+---------------+---------+-----------+----------+--------------+ FV Mid   Full                                                        +---------+---------------+---------+-----------+----------+--------------+ FV DistalFull                                                        +---------+---------------+---------+-----------+----------+--------------+ PFV      Full                                                        +---------+---------------+---------+-----------+----------+--------------+ POP      Full           Yes      Yes                                 +---------+---------------+---------+-----------+----------+--------------+ PTV      Full                                                        +---------+---------------+---------+-----------+----------+--------------+ PERO     Full                                                        +---------+---------------+---------+-----------+----------+--------------+ Gastroc  Full                                                        +---------+---------------+---------+-----------+----------+--------------+     Summary: RIGHT: - No evidence of common femoral vein obstruction.   LEFT: - There is no evidence of deep vein thrombosis in the lower extremity.  - No cystic structure found in the popliteal fossa.  *See table(s) above for measurements and observations. Electronically signed by  Norman Serve on 01/02/2024 at 4:03:51 PM.    Final     All questions were answered. The patient knows to call the clinic with any problems, questions or concerns. I spent 30 minutes in the care of this patient including H and P, review of records, counseling and coordination of care.     Amber Stalls, MD 01/14/2024 12:51 PM

## 2024-01-19 ENCOUNTER — Other Ambulatory Visit: Payer: Self-pay

## 2024-01-19 ENCOUNTER — Encounter: Payer: Self-pay | Admitting: *Deleted

## 2024-01-19 ENCOUNTER — Other Ambulatory Visit (HOSPITAL_COMMUNITY): Payer: Self-pay

## 2024-01-19 ENCOUNTER — Other Ambulatory Visit: Payer: Self-pay | Admitting: Hematology and Oncology

## 2024-01-19 MED ORDER — ANAGRELIDE HCL 0.5 MG PO CAPS
0.5000 mg | ORAL_CAPSULE | Freq: Every day | ORAL | 1 refills | Status: DC
  Filled 2024-01-19: qty 90, 90d supply, fill #0

## 2024-01-19 NOTE — Progress Notes (Signed)
 Anagrelide  dose decreased to 0.5 mg po daily, pt complains of diarrhea.  Jennise Both

## 2024-01-21 ENCOUNTER — Other Ambulatory Visit (HOSPITAL_COMMUNITY): Payer: Self-pay

## 2024-01-29 ENCOUNTER — Other Ambulatory Visit: Payer: Self-pay | Admitting: Family Medicine

## 2024-01-30 ENCOUNTER — Other Ambulatory Visit (HOSPITAL_BASED_OUTPATIENT_CLINIC_OR_DEPARTMENT_OTHER): Payer: Self-pay

## 2024-01-30 NOTE — Telephone Encounter (Signed)
 Rx refill request approved per Dr. Zollie Pee orders.

## 2024-02-10 ENCOUNTER — Inpatient Hospital Stay: Attending: Hematology and Oncology

## 2024-02-10 ENCOUNTER — Inpatient Hospital Stay (HOSPITAL_BASED_OUTPATIENT_CLINIC_OR_DEPARTMENT_OTHER): Admitting: Hematology and Oncology

## 2024-02-10 ENCOUNTER — Other Ambulatory Visit (HOSPITAL_COMMUNITY): Payer: Self-pay

## 2024-02-10 ENCOUNTER — Other Ambulatory Visit: Payer: Self-pay

## 2024-02-10 VITALS — BP 131/46 | HR 69 | Temp 97.5°F | Resp 17 | Wt 187.4 lb

## 2024-02-10 DIAGNOSIS — Z87891 Personal history of nicotine dependence: Secondary | ICD-10-CM | POA: Diagnosis not present

## 2024-02-10 DIAGNOSIS — M545 Low back pain, unspecified: Secondary | ICD-10-CM | POA: Insufficient documentation

## 2024-02-10 DIAGNOSIS — D473 Essential (hemorrhagic) thrombocythemia: Secondary | ICD-10-CM | POA: Insufficient documentation

## 2024-02-10 DIAGNOSIS — Z86718 Personal history of other venous thrombosis and embolism: Secondary | ICD-10-CM | POA: Diagnosis not present

## 2024-02-10 DIAGNOSIS — I82412 Acute embolism and thrombosis of left femoral vein: Secondary | ICD-10-CM | POA: Diagnosis not present

## 2024-02-10 DIAGNOSIS — D75839 Thrombocytosis, unspecified: Secondary | ICD-10-CM

## 2024-02-10 DIAGNOSIS — R197 Diarrhea, unspecified: Secondary | ICD-10-CM | POA: Diagnosis not present

## 2024-02-10 DIAGNOSIS — R5383 Other fatigue: Secondary | ICD-10-CM | POA: Diagnosis not present

## 2024-02-10 DIAGNOSIS — Z7901 Long term (current) use of anticoagulants: Secondary | ICD-10-CM | POA: Insufficient documentation

## 2024-02-10 LAB — CBC WITH DIFFERENTIAL/PLATELET
Abs Immature Granulocytes: 0.02 K/uL (ref 0.00–0.07)
Basophils Absolute: 0.1 K/uL (ref 0.0–0.1)
Basophils Relative: 1 %
Eosinophils Absolute: 0.2 K/uL (ref 0.0–0.5)
Eosinophils Relative: 3 %
HCT: 41.9 % (ref 36.0–46.0)
Hemoglobin: 13.5 g/dL (ref 12.0–15.0)
Immature Granulocytes: 0 %
Lymphocytes Relative: 27 %
Lymphs Abs: 1.8 K/uL (ref 0.7–4.0)
MCH: 29.9 pg (ref 26.0–34.0)
MCHC: 32.2 g/dL (ref 30.0–36.0)
MCV: 92.9 fL (ref 80.0–100.0)
Monocytes Absolute: 0.6 K/uL (ref 0.1–1.0)
Monocytes Relative: 9 %
Neutro Abs: 4 K/uL (ref 1.7–7.7)
Neutrophils Relative %: 60 %
Platelets: 513 K/uL — ABNORMAL HIGH (ref 150–400)
RBC: 4.51 MIL/uL (ref 3.87–5.11)
RDW: 14.4 % (ref 11.5–15.5)
WBC: 6.7 K/uL (ref 4.0–10.5)
nRBC: 0 % (ref 0.0–0.2)

## 2024-02-10 MED ORDER — APIXABAN 5 MG PO TABS
5.0000 mg | ORAL_TABLET | Freq: Two times a day (BID) | ORAL | 0 refills | Status: DC
  Filled 2024-02-10: qty 90, 45d supply, fill #0

## 2024-02-10 MED ORDER — HYDROXYUREA 500 MG PO CAPS
500.0000 mg | ORAL_CAPSULE | Freq: Every day | ORAL | 0 refills | Status: DC
  Filled 2024-02-10: qty 90, 90d supply, fill #0

## 2024-02-10 NOTE — Progress Notes (Signed)
  [facility] CONSULT NOTE  Patient Care Team: Leonel Cole, MD as PCP - General (Family Medicine)  CHIEF COMPLAINTS/PURPOSE OF CONSULTATION:  Essential thrombocytosis.  ASSESSMENT & PLAN:   Assessment and Plan Assessment & Plan  Essential thrombocythemia with medication intolerance (anagrelide ) and transition to hydroxyurea  Intolerance to anagrelide  with body aches, fatigue, and diarrhea. Hydroxyurea  considered as an alternative due to better tolerance profile. - Discontinue anagrelide . - Prescribe hydroxyurea  at lowest dose, 500 mg daily. - Send prescription for hydroxyurea  to [facility]. - Instruct to switch from anagrelide  to hydroxyurea  once prescription is ready.  History of left femoral vein thrombosis, resolved, on short-term anticoagulation Resolved left femoral vein thrombosis. Continuing short-term anticoagulation with Eliquis  for three months. - Continue Eliquis  for three months. - Send 90-day prescription for Eliquis  to pharmacy. - Re evaluate need for long term anticoagulation.  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 - Order retrieval of IVC filter before first week of October. - Instruct to hold blood thinner for three days prior to procedure.  Fatigue, body aches, and diarrhea likely secondary to anagrelide  Symptoms likely due to anagrelide , expected improvement with hydroxyurea  transition. - Monitor symptoms after transitioning to hydroxyurea .  HISTORY OF PRESENTING ILLNESS:  [name] 68 y.o. female is here because of thrombocytosis. Discussed the use of AI scribe software for clinical note transcription with the patient, who gave verbal consent to proceed.  History of Present Illness [name] is a 68 year old female with essential thrombocytosis who presents for follow-up of her condition and medication  management.  She experiences fatigue, which she attributes to her busy day cooking and cleaning for a small gathering. She also experiences shoulder aches and frequent diarrhea, occurring three to four times a day, which she associates with her medication, anagrelide .  She has a history of lower back pain, which she notes has worsened.  She is currently taking a blood thinner, which she has been on for a month.  She has an IVC filter placed in July, which she expects to be removed soon.  She mentions taking krill oil and inquires about its compatibility with her current medications.   All other systems were reviewed with the patient and are negative.  MEDICAL HISTORY:  Past Medical History:  Diagnosis Date   Anxiety    Arthritis    Atrophic vaginitis    Depression    GERD (gastroesophageal reflux disease)    GERD (gastroesophageal reflux disease)    Hypertension    Osteoarthritis    Seasonal allergies    Sleep apnea     SURGICAL HISTORY: Past Surgical History:  Procedure Laterality Date    fistula repair x 3  10 yrs ago   ABDOMINAL HYSTERECTOMY  yrs ago   COLONOSCOPY     IR ANGIOGRAM PELVIS SELECTIVE OR SUPRASELECTIVE  12/14/2023   IR ANGIOGRAM SELECTIVE EACH ADDITIONAL VESSEL  12/14/2023   IR EMBO ART  VEN HEMORR LYMPH EXTRAV  INC GUIDE ROADMAPPING  12/14/2023   IR IVC FILTER PLMT / S&I /IMG GUID/MOD SED  12/14/2023   IR US  GUIDE VASC ACCESS RIGHT  12/14/2023   JOINT REPLACEMENT  2012   right   right knee arthroscopic surgery  yrs ago   right rotator cuff repair  8 yrs    TOTAL KNEE ARTHROPLASTY  04/14/2012   Procedure: TOTAL KNEE ARTHROPLASTY;  Surgeon: Donnice JONETTA Car, MD;  Location: [facility];  Service: Orthopedics;  Laterality: Left;    SOCIAL HISTORY: Social History   Socioeconomic History   Marital status: Married    Spouse name: Not on file   Number of children: Not on file   Years of education: Not on file   Highest education level: Not on file  Occupational  History   Not on file  Tobacco Use   Smoking status: Former    Current packs/day: 0.00    Average packs/day: 0.3 packs/day for 20.0 years (5.0 ttl pk-yrs)    Types: Cigarettes    Start date: 06/10/1986    Quit date: 06/10/2006    Years since quitting: 17.6   Smokeless tobacco: Never  Vaping Use   Vaping status: Never Used  Substance and Sexual Activity   Alcohol use: Yes    Alcohol/week: 2.0 standard drinks of alcohol    Types: 2 Glasses of wine per week   Drug use: Yes    Types: Marijuana    Comment: marijuan use 30 yrs ago, none current   Sexual activity: Not Currently  Other Topics Concern   Not on file  Social History Narrative   Not on file   Social Drivers of Health   Financial Resource Strain: Not on file  Food Insecurity: No Food Insecurity (12/11/2023)   Hunger Vital Sign    Worried About Running Out of Food in the Last Year: Never true    Ran Out of Food in the Last Year: Never true  Transportation Needs: No Transportation Needs (12/11/2023)   PRAPARE - Administrator, Civil Service (Medical): No    Lack of Transportation (Non-Medical): No  Physical Activity: Not on file  Stress: Not on file  Social Connections: Socially Integrated (12/11/2023)   Social Connection and Isolation Panel    Frequency of Communication with Friends and Family: More than three times a week    Frequency of Social Gatherings with Friends and Family: More than three times a week    Attends Religious Services: More than 4 times per year    Active Member of [employer] or Organizations: Yes    Attends Banker Meetings: 1 to 4 times per year    Marital Status: Married  Catering manager Violence: Not At Risk (12/11/2023)   Humiliation, Afraid, Rape, and Kick questionnaire    Fear of Current or Ex-Partner: No    Emotionally Abused: No    Physically Abused: No    Sexually Abused: No    FAMILY HISTORY: Family History  Problem Relation Age of Onset   Diabetes Maternal  Grandfather     ALLERGIES:  is allergic to morphine and codeine and povidone.  MEDICATIONS:  Current Outpatient Medications  Medication Sig Dispense Refill   ALPRAZolam  (XANAX ) 0.25 MG tablet Take 0.25 mg by mouth as needed. anxiety     anagrelide  (AGRYLIN) 0.5 MG capsule Take 1 capsule (0.5 mg total) by mouth daily. 90 capsule 1   apixaban  (ELIQUIS ) 5 MG TABS tablet Take 1 tablet (5 mg total) by mouth 2 (two) times daily. 60 tablet 2   ferrous sulfate  324 MG TBEC Take 324 mg by mouth every other day.     FLUoxetine  (PROZAC ) 20 MG capsule Take 20 mg by mouth every evening.  3   gabapentin  (NEURONTIN ) 300 MG capsule Take 1 capsule by mouth three times daily as needed 90 capsule 0   HYDROcodone -acetaminophen  (NORCO/VICODIN) 5-325 MG tablet Take 1-2 tablets by mouth every 6 (six)  hours as needed for moderate pain (pain score 4-6). 30 tablet 0   KRILL OIL PO Take 1 capsule by mouth daily.     losartan (COZAAR) 50 MG tablet Take 50 mg by mouth every evening.     pantoprazole  (PROTONIX ) 40 MG tablet Take 40 mg by mouth daily.     Probiotic Product (PROBIOTIC DAILY PO) Take 1 capsule by mouth daily.     promethazine  (PHENERGAN ) 25 MG tablet Take 12.5-25 mg by mouth every 6 (six) hours as needed for nausea or vomiting.     No current facility-administered medications for this visit.     PHYSICAL EXAMINATION: ECOG PERFORMANCE STATUS: 0 - Asymptomatic  Vitals:   02/10/24 1316  BP: (!) 131/46  Pulse: 69  Resp: 17  Temp: (!) 97.5 F (36.4 C)  SpO2: 98%   Filed Weights   02/10/24 1316  Weight: 187 lb 6.4 oz (85 kg)    GENERAL:alert, no distress and comfortable No cervical adenopathy CTA bilaterally RRR Abdomen: soft, non tender, non distended, BS normal No LE edema.  LABORATORY DATA:  I have reviewed the data as listed Lab Results  Component Value Date   WBC 6.7 02/10/2024   HGB 13.5 02/10/2024   HCT 41.9 02/10/2024   MCV 92.9 02/10/2024   PLT 513 (H) 02/10/2024      Chemistry      Component Value Date/Time   NA 136 12/15/2023 0455   K 4.3 12/15/2023 0455   CL 100 12/15/2023 0455   CO2 25 12/15/2023 0455   BUN 15 12/15/2023 0455   CREATININE 0.82 12/15/2023 0455   CREATININE 0.91 07/05/2023 1100      Component Value Date/Time   CALCIUM 8.7 (L) 12/15/2023 0455   ALKPHOS 65 12/10/2023 1946   AST 21 12/10/2023 1946   AST 16 07/05/2023 1100   ALT 17 12/10/2023 1946   ALT 15 07/05/2023 1100   BILITOT 0.8 12/10/2023 1946   BILITOT 0.4 07/05/2023 1100       RADIOGRAPHIC STUDIES: I have personally reviewed the radiological images as listed and agreed with the findings in the report. No results found.   All questions were answered. The patient knows to call the clinic with any problems, questions or concerns. I spent 30 minutes in the care of this patient including H and P, review of records, counseling and coordination of care.     Amber Stalls, MD 02/10/2024 1:30 PM

## 2024-02-12 ENCOUNTER — Inpatient Hospital Stay
Admission: RE | Admit: 2024-02-12 | Discharge: 2024-02-12 | Discharge status: Home / Self Care | Source: Ambulatory Visit | Attending: Radiology | Admitting: Radiology

## 2024-02-12 ENCOUNTER — Ambulatory Visit
Admission: RE | Admit: 2024-02-12 | Discharge: 2024-02-12 | Disposition: A | Discharge status: Home / Self Care | Source: Ambulatory Visit

## 2024-02-12 DIAGNOSIS — I82412 Acute embolism and thrombosis of left femoral vein: Secondary | ICD-10-CM

## 2024-02-17 ENCOUNTER — Telehealth: Payer: Self-pay

## 2024-02-17 ENCOUNTER — Other Ambulatory Visit (HOSPITAL_COMMUNITY): Payer: Self-pay | Admitting: Radiology

## 2024-02-17 DIAGNOSIS — I82412 Acute embolism and thrombosis of left femoral vein: Secondary | ICD-10-CM

## 2024-02-17 NOTE — Telephone Encounter (Signed)
 Pt called to r/s her appt that was scheduled for 9/30. States she has been trying to get in touch with scheduling x3 with no call back.  Assisted pt in r/s her appt to 03/11/24 at 0915 lab and 0945 MD visit.  She also inquired about travelers insurance form that needs to be filled out on her behalf. This was mailed to us . She advised she would like to pick it up when she comes in for her 10/2 appt.

## 2024-02-24 ENCOUNTER — Other Ambulatory Visit: Payer: Self-pay | Admitting: Family Medicine

## 2024-02-24 NOTE — Telephone Encounter (Signed)
 Last OV 05/14/23 Next OV not scheduled  Last refill 01/30/24 Qty #90/0

## 2024-02-27 ENCOUNTER — Ambulatory Visit: Admitting: Hematology and Oncology

## 2024-03-03 ENCOUNTER — Ambulatory Visit
Admission: RE | Admit: 2024-03-03 | Discharge: 2024-03-03 | Disposition: A | Discharge status: Home / Self Care | Source: Ambulatory Visit | Attending: Radiology | Admitting: Radiology

## 2024-03-03 DIAGNOSIS — I82412 Acute embolism and thrombosis of left femoral vein: Secondary | ICD-10-CM

## 2024-03-04 NOTE — Consult Note (Signed)
 Chief Complaint: IVC filter retrieval  Referring Provider(s): Allred,Darrell K    Patient Status: [facility] outpatient  History of Present Illness: [name] is a 68 y.o. female who had a bone marrow biopsy performed in July 2025 with subsequent arterial hemorrhage secondary to the procedure. During the workup the patient was going to require anticoagulation, however the patient could not be anticoagulated despite having a DVT involving the external iliac vein, internal iliac vein, and femoral vein in the left lower extremity. On 12/14/2023 the patient underwent [place] IVC filter placement as well as embolization of an arterial bleed. The patient was subsequently discharged and is now fully anticoagulated on oral anticoagulation. Patient is taking 5 mg of Eliquis  daily. Patient is ambulatory without difficulty.    The images of the IVC filter placement as well as the procedure were the arterial hemorrhage was embolized was reviewed with the patient in order to improve her understanding of what has previously been done and what will be done with filter retrieval. The procedure, risks, benefits, and alternatives were discussed in detail. All the patient's questions were answered to her satisfaction. The patient's husband was present for the entire visit and all of his questions were answered as well. The procedure would be performed under moderate sedation with access to the right internal jugular vein in order for retrieval. Expectations as well as potential complications and the possibility of staging the procedure if the filter could not be easily retrieved were all discussed in detail as well.    Past Surgical History:  Procedure Laterality Date    fistula repair x 3  10 yrs ago   ABDOMINAL HYSTERECTOMY  yrs ago   COLONOSCOPY     IR ANGIOGRAM PELVIS SELECTIVE OR SUPRASELECTIVE  12/14/2023   IR ANGIOGRAM SELECTIVE EACH ADDITIONAL VESSEL  12/14/2023   IR EMBO ART  VEN HEMORR LYMPH EXTRAV   INC GUIDE ROADMAPPING  12/14/2023   IR IVC FILTER PLMT / S&I /IMG GUID/MOD SED  12/14/2023   IR US  GUIDE VASC ACCESS RIGHT  12/14/2023   JOINT REPLACEMENT  2012   right   right knee arthroscopic surgery  yrs ago   right rotator cuff repair  8 yrs    TOTAL KNEE ARTHROPLASTY  04/14/2012   Procedure: TOTAL KNEE ARTHROPLASTY;  Surgeon: Donnice JONETTA Car, MD;  Location: [facility];  Service: Orthopedics;  Laterality: Left;    Allergies: Morphine and codeine and Povidone  Medications: Prior to Admission medications   Medication Sig Start Date End Date Taking? Authorizing Provider  ALPRAZolam  (XANAX ) 0.25 MG tablet Take 0.25 mg by mouth as needed. anxiety    [provider]  apixaban  (ELIQUIS ) 5 MG TABS tablet Take 1 tablet (5 mg total) by mouth 2 (two) times daily. 02/10/24   Iruku, Praveena, MD  ferrous sulfate  324 MG TBEC Take 324 mg by mouth every other day.    [provider]  FLUoxetine  (PROZAC ) 20 MG capsule Take 20 mg by mouth every evening. 07/13/14   [provider]  gabapentin  (NEURONTIN ) 300 MG capsule Take 1 capsule by mouth three times daily as needed 02/24/24   Corey, Evan S, MD  HYDROcodone -acetaminophen  (NORCO/VICODIN) 5-325 MG tablet Take 1-2 tablets by mouth every 6 (six) hours as needed for moderate pain (pain score 4-6). 12/15/23   Vernon Ranks, MD  hydroxyurea  (HYDREA ) 500 MG capsule Take 1 capsule (500 mg total) by mouth daily. May take with food to minimize GI side effects. 02/10/24  Iruku, Praveena, MD  KRILL OIL PO Take 1 capsule by mouth daily.    [provider]  losartan (COZAAR) 50 MG tablet Take 50 mg by mouth every evening.    [provider]  pantoprazole  (PROTONIX ) 40 MG tablet Take 40 mg by mouth daily.    [provider]  Probiotic Product (PROBIOTIC DAILY PO) Take 1 capsule by mouth daily.    [provider]  promethazine  (PHENERGAN ) 25 MG tablet Take 12.5-25 mg by mouth every 6 (six) hours as needed for nausea or  vomiting.    [provider]     Family History  Problem Relation Age of Onset   Diabetes Maternal Grandfather     Social History   Socioeconomic History   Marital status: Married    Spouse name: Not on file   Number of children: Not on file   Years of education: Not on file   Highest education level: Not on file  Occupational History   Not on file  Tobacco Use   Smoking status: Former    Current packs/day: 0.00    Average packs/day: 0.3 packs/day for 20.0 years (5.0 ttl pk-yrs)    Types: Cigarettes    Start date: 06/10/1986    Quit date: 06/10/2006    Years since quitting: 17.7   Smokeless tobacco: Never  Vaping Use   Vaping status: Never Used  Substance and Sexual Activity   Alcohol use: Yes    Alcohol/week: 2.0 standard drinks of alcohol    Types: 2 Glasses of wine per week   Drug use: Yes    Types: Marijuana    Comment: marijuan use 30 yrs ago, none current   Sexual activity: Not Currently  Other Topics Concern   Not on file  Social History Narrative   Not on file   Social Drivers of Health   Financial Resource Strain: Not on file  Food Insecurity: No Food Insecurity (12/11/2023)   Hunger Vital Sign    Worried About Running Out of Food in the Last Year: Never true    Ran Out of Food in the Last Year: Never true  Transportation Needs: No Transportation Needs (12/11/2023)   PRAPARE - Administrator, Civil Service (Medical): No    Lack of Transportation (Non-Medical): No  Physical Activity: Not on file  Stress: Not on file  Social Connections: Socially Integrated (12/11/2023)   Social Connection and Isolation Panel    Frequency of Communication with Friends and Family: More than three times a week    Frequency of Social Gatherings with Friends and Family: More than three times a week    Attends Religious Services: More than 4 times per year    Active Member of [employer] or Organizations: Yes    Attends Banker Meetings: 1 to 4 times per  year    Marital Status: Married     Review of Systems: A 12 point ROS discussed and pertinent positives are indicated in the HPI above.  All other systems are negative.  Review of Systems  All other systems reviewed and are negative.   Vital Signs: BP 125/63 (BP Location: Left Arm, Patient Position: Sitting, Cuff Size: Normal)   Pulse 96   Temp 97.6 F (36.4 C) (Oral)   Resp 18   SpO2 97%     Physical Exam Constitutional:      Appearance: Normal appearance.  Cardiovascular:     Rate and Rhythm: Normal rate and regular rhythm.  Heart sounds: Normal heart sounds.  Pulmonary:     Effort: Pulmonary effort is normal.     Breath sounds: Normal breath sounds.  Skin:    General: Skin is warm and dry.  Neurological:     Mental Status: She is alert.  Psychiatric:        Mood and Affect: Mood normal.        Behavior: Behavior normal.        Thought Content: Thought content normal.        Judgment: Judgment normal.     Imaging: US  Venous Img Lower Bilateral (DVT) Result Date: 02/12/2024 CLINICAL DATA:  Pre IVC filter removal scan. EXAM: BILATERAL LOWER EXTREMITY VENOUS DOPPLER ULTRASOUND TECHNIQUE: Gray-scale sonography with graded compression, as well as color Doppler and duplex ultrasound were performed to evaluate the lower extremity deep venous systems from the level of the common femoral vein and including the common femoral, femoral, profunda femoral, popliteal and calf veins including the posterior tibial, peroneal and gastrocnemius veins when visible. The superficial great saphenous vein was also interrogated. Spectral Doppler was utilized to evaluate flow at rest and with distal augmentation maneuvers in the common femoral, femoral and popliteal veins. COMPARISON:  None Available. FINDINGS: RIGHT LOWER EXTREMITY Common Femoral Vein: No evidence of thrombus. Normal compressibility, respiratory phasicity and response to augmentation. Saphenofemoral Junction: No evidence of  thrombus. Normal compressibility and flow on color Doppler imaging. Profunda Femoral Vein: No evidence of thrombus. Normal compressibility and flow on color Doppler imaging. Femoral Vein: The proximal and midportion of the femoral vein are patent. There is linear and nonocclusive thrombus in the periphery of the distal femoral vein with partial compressibility of the vessel. This is favored to represent chronic DVT/scarring. Direct comparison with prior studies, if available, recommended. Popliteal Vein: No evidence of thrombus. Normal compressibility, respiratory phasicity and response to augmentation. Calf Veins: No evidence of thrombus. Normal compressibility and flow on color Doppler imaging. Superficial Great Saphenous Vein: No evidence of thrombus. Normal compressibility. Venous Reflux:  None. Other Findings:  None. LEFT LOWER EXTREMITY Common Femoral Vein: No evidence of thrombus. Normal compressibility, respiratory phasicity and response to augmentation. Saphenofemoral Junction: No evidence of thrombus. Normal compressibility and flow on color Doppler imaging. Profunda Femoral Vein: No evidence of thrombus. Normal compressibility and flow on color Doppler imaging. Femoral Vein: No evidence of thrombus. Normal compressibility, respiratory phasicity and response to augmentation. Popliteal Vein: No evidence of thrombus. Normal compressibility, respiratory phasicity and response to augmentation. Calf Veins: No evidence of thrombus. Normal compressibility and flow on color Doppler imaging. Superficial Great Saphenous Vein: No evidence of thrombus. Normal compressibility. Venous Reflux:  None. Other Findings:  None. IMPRESSION: 1. Nonocclusive and peripheral thrombus in the right distal femoral vein favored to represent chronic clot or scarring. Clinical correlation is recommended. 2. No evidence of DVT in the left lower extremity. Electronically Signed   By: Vanetta Chou M.D.   On: 02/12/2024 14:19     Labs:  CBC: Recent Labs    01/02/24 1159 01/05/24 1112 01/14/24 1209 02/10/24 1302  WBC 7.7 8.8 7.4 6.7  HGB 9.9* 10.9* 11.9* 13.5  HCT 31.6* 34.6* 37.0 41.9  PLT 729* 587* 460* 513*    COAGS: No results for input(s): INR, APTT in the last 8760 hours.  BMP: Recent Labs    07/05/23 1100 12/10/23 1946 12/11/23 0508 12/15/23 0455  NA 138 135 135 136  K 4.4 4.3 3.8 4.3  CL 104 99 100 100  CO2  29 23 27 25   GLUCOSE 100* 145* 96 93  BUN 24* 29* 22 15  CALCIUM 9.1 8.9 8.6* 8.7*  CREATININE 0.91 1.04* 0.86 0.82  GFRNONAA >60 59* >60 >60    LIVER FUNCTION TESTS: Recent Labs    07/05/23 1100 12/10/23 1946  BILITOT 0.4 0.8  AST 16 21  ALT 15 17  ALKPHOS 67 65  PROT 7.0 7.2  ALBUMIN 4.0 4.0    TUMOR MARKERS: No results for input(s): AFPTM, CEA, CA199, CHROMGRNA in the last 8760 hours.  Assessment and Plan:  IVC filter retrieval under moderate sedation.  The patient will remain on anticoagulation.  The patient may be scheduled at the [facility] for the procedure.    Electronically Signed: Cordella DELENA Banner, MD   03/04/2024, 9:04 AM     I spent a total of  40 Minutes in face to face in clinical consultation, greater than 50% of which was counseling/coordinating care for IVC filter retrieval.

## 2024-03-09 ENCOUNTER — Other Ambulatory Visit

## 2024-03-09 ENCOUNTER — Ambulatory Visit: Admitting: Hematology and Oncology

## 2024-03-11 ENCOUNTER — Inpatient Hospital Stay: Admitting: Hematology and Oncology

## 2024-03-11 ENCOUNTER — Other Ambulatory Visit (HOSPITAL_COMMUNITY): Payer: Self-pay

## 2024-03-11 ENCOUNTER — Inpatient Hospital Stay: Attending: Hematology and Oncology

## 2024-03-11 VITALS — BP 141/49 | HR 92 | Temp 97.6°F | Resp 16 | Wt 188.2 lb

## 2024-03-11 DIAGNOSIS — Z86711 Personal history of pulmonary embolism: Secondary | ICD-10-CM | POA: Diagnosis not present

## 2024-03-11 DIAGNOSIS — I82412 Acute embolism and thrombosis of left femoral vein: Secondary | ICD-10-CM

## 2024-03-11 DIAGNOSIS — Z8616 Personal history of COVID-19: Secondary | ICD-10-CM | POA: Insufficient documentation

## 2024-03-11 DIAGNOSIS — D75839 Thrombocytosis, unspecified: Secondary | ICD-10-CM

## 2024-03-11 DIAGNOSIS — Z87891 Personal history of nicotine dependence: Secondary | ICD-10-CM | POA: Insufficient documentation

## 2024-03-11 DIAGNOSIS — Z7901 Long term (current) use of anticoagulants: Secondary | ICD-10-CM | POA: Insufficient documentation

## 2024-03-11 DIAGNOSIS — D473 Essential (hemorrhagic) thrombocythemia: Secondary | ICD-10-CM | POA: Insufficient documentation

## 2024-03-11 DIAGNOSIS — Z79899 Other long term (current) drug therapy: Secondary | ICD-10-CM | POA: Insufficient documentation

## 2024-03-11 LAB — CBC WITH DIFFERENTIAL/PLATELET
Abs Immature Granulocytes: 0.02 K/uL (ref 0.00–0.07)
Basophils Absolute: 0.1 K/uL (ref 0.0–0.1)
Basophils Relative: 1 %
Eosinophils Absolute: 0.3 K/uL (ref 0.0–0.5)
Eosinophils Relative: 4 %
HCT: 41.9 % (ref 36.0–46.0)
Hemoglobin: 14.2 g/dL (ref 12.0–15.0)
Immature Granulocytes: 0 %
Lymphocytes Relative: 23 %
Lymphs Abs: 2 K/uL (ref 0.7–4.0)
MCH: 30.4 pg (ref 26.0–34.0)
MCHC: 33.9 g/dL (ref 30.0–36.0)
MCV: 89.7 fL (ref 80.0–100.0)
Monocytes Absolute: 0.6 K/uL (ref 0.1–1.0)
Monocytes Relative: 8 %
Neutro Abs: 5.6 K/uL (ref 1.7–7.7)
Neutrophils Relative %: 64 %
Platelets: 498 K/uL — ABNORMAL HIGH (ref 150–400)
RBC: 4.67 MIL/uL (ref 3.87–5.11)
RDW: 14.8 % (ref 11.5–15.5)
WBC: 8.6 K/uL (ref 4.0–10.5)
nRBC: 0 % (ref 0.0–0.2)

## 2024-03-11 MED ORDER — HYDROXYUREA 500 MG PO CAPS
500.0000 mg | ORAL_CAPSULE | Freq: Two times a day (BID) | ORAL | 0 refills | Status: AC
  Filled 2024-03-11 – 2024-03-23 (×3): qty 180, 90d supply, fill #0

## 2024-03-11 NOTE — Progress Notes (Signed)
  [facility]cer Center CONSULT NOTE  Patient Care Team: Leonel Cole, MD as PCP - General (Family Medicine)  CHIEF COMPLAINTS/PURPOSE OF CONSULTATION:  Essential thrombocytosis.  ASSESSMENT & PLAN:   Assessment and Plan Assessment & Plan  Essential thrombocythemia Platelet count at 498,000. Goal: reduce to <400,000. Tolerating Hydrea  with minor side effect of gas. Hemoglobin at 14.2, expected to decrease slightly with increased Hydrea  but remain normal. - Increase Hydrea  to one tablet morning and afternoon. - Monitor platelet count and hemoglobin levels.  Anticoagulation management for thrombosis risk with planned IVC filter removal High risk for re-clotting due to essential thrombocythemia. IVC filter removal scheduled. On blood thinners since mid-November, to continue for at least three months. Consideration for long term anticoagulation post three months. - Remove IVC filter next Friday. - Continue blood thinner for at least three months. - Consider low-dose blood thinner prophylactically after three months.  Follow-up Follow-up needed to monitor increased Hydrea  effects and anticoagulation management. - Schedule follow-up in one month with blood work. - Adjust follow-up to every three to four months once Hydrea  dose is stabilized.  HISTORY OF PRESENTING ILLNESS:  [name] 68 y.o. female is here because of thrombocytosis. Discussed the use of AI scribe software for clinical note transcription with the patient, who gave verbal consent to proceed.  History of Present Illness  [name] is a 68 year old female with essential thrombocytosis who presents for follow-up regarding her medication regimen and IVC filter removal.  She is currently being treated for essential thrombocytosis with Hydrea , which has reduced her previous symptoms of uncontrollable diarrhea with anagrelide   She has an IVC filter scheduled for removal. She has been on blood thinners since  the placement of the filter. A blood clot was initially detected but was not present upon subsequent examination.  She recently recovered from COVID-19, which she contracted two weeks ago. She took Paxlovid promptly after testing positive, as her husband had also contracted the virus.  She experiences lower back pain, which she attributes to overexertion from cooking for large family gatherings.   All other systems were reviewed with the patient and are negative.  MEDICAL HISTORY:  Past Medical History:  Diagnosis Date   Anxiety    Arthritis    Atrophic vaginitis    Depression    GERD (gastroesophageal reflux disease)    GERD (gastroesophageal reflux disease)    Hypertension    Osteoarthritis    Seasonal allergies    Sleep apnea     SURGICAL HISTORY: Past Surgical History:  Procedure Laterality Date    fistula repair x 3  10 yrs ago   ABDOMINAL HYSTERECTOMY  yrs ago   COLONOSCOPY     IR ANGIOGRAM PELVIS SELECTIVE OR SUPRASELECTIVE  12/14/2023   IR ANGIOGRAM SELECTIVE EACH ADDITIONAL VESSEL  12/14/2023   IR EMBO ART  VEN HEMORR LYMPH EXTRAV  INC GUIDE ROADMAPPING  12/14/2023   IR IVC FILTER PLMT / S&I /IMG GUID/MOD SED  12/14/2023   IR US  GUIDE VASC ACCESS RIGHT  12/14/2023   JOINT REPLACEMENT  2012   right   right knee arthroscopic surgery  yrs ago   right rotator cuff repair  8 yrs    TOTAL KNEE ARTHROPLASTY  04/14/2012   Procedure: TOTAL KNEE ARTHROPLASTY;  Surgeon: Donnice JONETTA Car, MD;  Location: [facility];  Service: Orthopedics;  Laterality: Left;    SOCIAL HISTORY: Social History   Socioeconomic History   Marital status: Married    Spouse  name: Not on file   Number of children: Not on file   Years of education: Not on file   Highest education level: Not on file  Occupational History   Not on file  Tobacco Use   Smoking status: Former    Current packs/day: 0.00    Average packs/day: 0.3 packs/day for 20.0 years (5.0 ttl pk-yrs)    Types: Cigarettes    Start date:  06/10/1986    Quit date: 06/10/2006    Years since quitting: 17.7   Smokeless tobacco: Never  Vaping Use   Vaping status: Never Used  Substance and Sexual Activity   Alcohol use: Yes    Alcohol/week: 2.0 standard drinks of alcohol    Types: 2 Glasses of wine per week   Drug use: Yes    Types: Marijuana    Comment: marijuan use 30 yrs ago, none current   Sexual activity: Not Currently  Other Topics Concern   Not on file  Social History Narrative   Not on file   Social Drivers of Health   Financial Resource Strain: Not on file  Food Insecurity: No Food Insecurity (12/11/2023)   Hunger Vital Sign    Worried About Running Out of Food in the Last Year: Never true    Ran Out of Food in the Last Year: Never true  Transportation Needs: No Transportation Needs (12/11/2023)   PRAPARE - Administrator, Civil Service (Medical): No    Lack of Transportation (Non-Medical): No  Physical Activity: Not on file  Stress: Not on file  Social Connections: Socially Integrated (12/11/2023)   Social Connection and Isolation Panel    Frequency of Communication with Friends and Family: More than three times a week    Frequency of Social Gatherings with Friends and Family: More than three times a week    Attends Religious Services: More than 4 times per year    Active Member of [employer] or Organizations: Yes    Attends Banker Meetings: 1 to 4 times per year    Marital Status: Married  Catering manager Violence: Not At Risk (12/11/2023)   Humiliation, Afraid, Rape, and Kick questionnaire    Fear of Current or Ex-Partner: No    Emotionally Abused: No    Physically Abused: No    Sexually Abused: No    FAMILY HISTORY: Family History  Problem Relation Age of Onset   Diabetes Maternal Grandfather     ALLERGIES:  is allergic to morphine and codeine and povidone.  MEDICATIONS:  Current Outpatient Medications  Medication Sig Dispense Refill   ALPRAZolam  (XANAX ) 0.25 MG tablet Take  0.25 mg by mouth as needed. anxiety     apixaban  (ELIQUIS ) 5 MG TABS tablet Take 1 tablet (5 mg total) by mouth 2 (two) times daily. 90 tablet 0   ferrous sulfate  324 MG TBEC Take 324 mg by mouth every other day.     FLUoxetine  (PROZAC ) 20 MG capsule Take 20 mg by mouth every evening.  3   gabapentin  (NEURONTIN ) 300 MG capsule Take 1 capsule by mouth three times daily as needed 90 capsule 1   HYDROcodone -acetaminophen  (NORCO/VICODIN) 5-325 MG tablet Take 1-2 tablets by mouth every 6 (six) hours as needed for moderate pain (pain score 4-6). 30 tablet 0   hydroxyurea  (HYDREA ) 500 MG capsule Take 1 capsule (500 mg total) by mouth daily. May take with food to minimize GI side effects. 90 capsule 0   KRILL OIL PO Take 1 capsule  by mouth daily.     losartan (COZAAR) 50 MG tablet Take 50 mg by mouth every evening.     pantoprazole  (PROTONIX ) 40 MG tablet Take 40 mg by mouth daily.     Probiotic Product (PROBIOTIC DAILY PO) Take 1 capsule by mouth daily.     promethazine  (PHENERGAN ) 25 MG tablet Take 12.5-25 mg by mouth every 6 (six) hours as needed for nausea or vomiting.     No current facility-administered medications for this visit.     PHYSICAL EXAMINATION: ECOG PERFORMANCE STATUS: 0 - Asymptomatic  Vitals:   03/11/24 0925  BP: (!) 141/49  Pulse: 92  Resp: 16  Temp: 97.6 F (36.4 C)  SpO2: 98%   Filed Weights   03/11/24 0925  Weight: 188 lb 3.2 oz (85.4 kg)    GENERAL:alert, no distress and comfortable No cervical adenopathy CTA bilaterally RRR Abdomen: soft, non tender, non distended, BS normal No LE edema.  LABORATORY DATA:  I have reviewed the data as listed Lab Results  Component Value Date   WBC 8.6 03/11/2024   HGB 14.2 03/11/2024   HCT 41.9 03/11/2024   MCV 89.7 03/11/2024   PLT 498 (H) 03/11/2024     Chemistry      Component Value Date/Time   NA 136 12/15/2023 0455   K 4.3 12/15/2023 0455   CL 100 12/15/2023 0455   CO2 25 12/15/2023 0455   BUN 15  12/15/2023 0455   CREATININE 0.82 12/15/2023 0455   CREATININE 0.91 07/05/2023 1100      Component Value Date/Time   CALCIUM 8.7 (L) 12/15/2023 0455   ALKPHOS 65 12/10/2023 1946   AST 21 12/10/2023 1946   AST 16 07/05/2023 1100   ALT 17 12/10/2023 1946   ALT 15 07/05/2023 1100   BILITOT 0.8 12/10/2023 1946   BILITOT 0.4 07/05/2023 1100       RADIOGRAPHIC STUDIES: I have personally reviewed the radiological images as listed and agreed with the findings in the report. US  Venous Img Lower Bilateral (DVT) Result Date: 02/12/2024 CLINICAL DATA:  Pre IVC filter removal scan. EXAM: BILATERAL LOWER EXTREMITY VENOUS DOPPLER ULTRASOUND TECHNIQUE: Gray-scale sonography with graded compression, as well as color Doppler and duplex ultrasound were performed to evaluate the lower extremity deep venous systems from the level of the common femoral vein and including the common femoral, femoral, profunda femoral, popliteal and calf veins including the posterior tibial, peroneal and gastrocnemius veins when visible. The superficial great saphenous vein was also interrogated. Spectral Doppler was utilized to evaluate flow at rest and with distal augmentation maneuvers in the common femoral, femoral and popliteal veins. COMPARISON:  None Available. FINDINGS: RIGHT LOWER EXTREMITY Common Femoral Vein: No evidence of thrombus. Normal compressibility, respiratory phasicity and response to augmentation. Saphenofemoral Junction: No evidence of thrombus. Normal compressibility and flow on color Doppler imaging. Profunda Femoral Vein: No evidence of thrombus. Normal compressibility and flow on color Doppler imaging. Femoral Vein: The proximal and midportion of the femoral vein are patent. There is linear and nonocclusive thrombus in the periphery of the distal femoral vein with partial compressibility of the vessel. This is favored to represent chronic DVT/scarring. Direct comparison with prior studies, if available,  recommended. Popliteal Vein: No evidence of thrombus. Normal compressibility, respiratory phasicity and response to augmentation. Calf Veins: No evidence of thrombus. Normal compressibility and flow on color Doppler imaging. Superficial Great Saphenous Vein: No evidence of thrombus. Normal compressibility. Venous Reflux:  None. Other Findings:  None. LEFT LOWER EXTREMITY  Common Femoral Vein: No evidence of thrombus. Normal compressibility, respiratory phasicity and response to augmentation. Saphenofemoral Junction: No evidence of thrombus. Normal compressibility and flow on color Doppler imaging. Profunda Femoral Vein: No evidence of thrombus. Normal compressibility and flow on color Doppler imaging. Femoral Vein: No evidence of thrombus. Normal compressibility, respiratory phasicity and response to augmentation. Popliteal Vein: No evidence of thrombus. Normal compressibility, respiratory phasicity and response to augmentation. Calf Veins: No evidence of thrombus. Normal compressibility and flow on color Doppler imaging. Superficial Great Saphenous Vein: No evidence of thrombus. Normal compressibility. Venous Reflux:  None. Other Findings:  None. IMPRESSION: 1. Nonocclusive and peripheral thrombus in the right distal femoral vein favored to represent chronic clot or scarring. Clinical correlation is recommended. 2. No evidence of DVT in the left lower extremity. Electronically Signed   By: Vanetta Chou M.D.   On: 02/12/2024 14:19     All questions were answered. The patient knows to call the clinic with any problems, questions or concerns. I spent 30 minutes in the care of this patient including H and P, review of records, counseling and coordination of care.     Amber Stalls, MD 03/11/2024 9:44 AM

## 2024-03-15 ENCOUNTER — Other Ambulatory Visit (HOSPITAL_COMMUNITY): Payer: Self-pay

## 2024-03-15 ENCOUNTER — Other Ambulatory Visit: Payer: Self-pay | Admitting: Hematology and Oncology

## 2024-03-15 MED ORDER — APIXABAN 5 MG PO TABS
5.0000 mg | ORAL_TABLET | Freq: Two times a day (BID) | ORAL | 0 refills | Status: DC
  Filled 2024-03-15 – 2024-03-23 (×2): qty 90, 45d supply, fill #0
  Filled 2024-03-29: qty 45, 23d supply, fill #0

## 2024-03-16 ENCOUNTER — Other Ambulatory Visit (HOSPITAL_COMMUNITY): Payer: Self-pay

## 2024-03-17 ENCOUNTER — Other Ambulatory Visit: Payer: Self-pay | Admitting: Radiology

## 2024-03-17 DIAGNOSIS — I82412 Acute embolism and thrombosis of left femoral vein: Secondary | ICD-10-CM

## 2024-03-18 ENCOUNTER — Other Ambulatory Visit: Payer: Self-pay

## 2024-03-19 ENCOUNTER — Other Ambulatory Visit: Payer: Self-pay

## 2024-03-19 ENCOUNTER — Ambulatory Visit (HOSPITAL_COMMUNITY)
Admission: RE | Admit: 2024-03-19 | Discharge: 2024-03-19 | Disposition: A | Discharge status: Home / Self Care | Source: Ambulatory Visit | Attending: Hematology and Oncology | Admitting: Hematology and Oncology

## 2024-03-19 DIAGNOSIS — Z7901 Long term (current) use of anticoagulants: Secondary | ICD-10-CM | POA: Insufficient documentation

## 2024-03-19 DIAGNOSIS — Z4589 Encounter for adjustment and management of other implanted devices: Secondary | ICD-10-CM | POA: Diagnosis present

## 2024-03-19 DIAGNOSIS — Z86718 Personal history of other venous thrombosis and embolism: Secondary | ICD-10-CM | POA: Insufficient documentation

## 2024-03-19 DIAGNOSIS — I82412 Acute embolism and thrombosis of left femoral vein: Secondary | ICD-10-CM

## 2024-03-19 HISTORY — PX: IR IVC FILTER RETRIEVAL / S&I /IMG GUID/MOD SED: IMG5308

## 2024-03-19 LAB — BASIC METABOLIC PANEL WITH GFR
Anion gap: 11 (ref 5–15)
BUN: 16 mg/dL (ref 8–23)
CO2: 25 mmol/L (ref 22–32)
Calcium: 8.9 mg/dL (ref 8.9–10.3)
Chloride: 104 mmol/L (ref 98–111)
Creatinine, Ser: 0.73 mg/dL (ref 0.44–1.00)
GFR, Estimated: >60 mL/min (ref >60)
Glucose, Bld: 102 mg/dL — ABNORMAL HIGH (ref 70–99)
Potassium: 4.2 mmol/L (ref 3.5–5.1)
Sodium: 140 mmol/L (ref 135–145)

## 2024-03-19 MED ORDER — LIDOCAINE-EPINEPHRINE 1 %-1:100000 IJ SOLN
INTRAMUSCULAR | Status: AC
  Filled 2024-03-19: qty 1

## 2024-03-19 MED ORDER — FENTANYL CITRATE (PF) 100 MCG/2ML IJ SOLN
INTRAMUSCULAR | Status: AC | PRN
  Administered 2024-03-19: 50 ug via INTRAVENOUS
  Administered 2024-03-19 (×2): 25 ug via INTRAVENOUS

## 2024-03-19 MED ORDER — SODIUM CHLORIDE 0.9 % IV SOLN: INTRAVENOUS | Status: DC

## 2024-03-19 MED ORDER — IOPAMIDOL (ISOVUE-300) INJECTION 61%
30.0000 mL | Freq: Once | INTRAVENOUS | Status: AC | PRN
  Administered 2024-03-19: 30 mL via INTRAVENOUS

## 2024-03-19 MED ORDER — MIDAZOLAM HCL 2 MG/2ML IJ SOLN
INTRAMUSCULAR | Status: AC | PRN
  Administered 2024-03-19: .5 mg via INTRAVENOUS
  Administered 2024-03-19: 1 mg via INTRAVENOUS
  Administered 2024-03-19: .5 mg via INTRAVENOUS

## 2024-03-19 MED ORDER — FENTANYL CITRATE (PF) 100 MCG/2ML IJ SOLN
INTRAMUSCULAR | Status: AC
  Filled 2024-03-19: qty 2

## 2024-03-19 MED ORDER — MIDAZOLAM HCL 2 MG/2ML IJ SOLN
INTRAMUSCULAR | Status: AC
  Filled 2024-03-19: qty 2

## 2024-03-19 MED ORDER — LIDOCAINE HCL (PF) 1 % IJ SOLN
10.0000 mL | Freq: Once | INTRAMUSCULAR | Status: AC
Start: 2024-03-19 — End: 2024-03-19
  Administered 2024-03-19: 10 mL via INTRADERMAL

## 2024-03-19 NOTE — H&P (Signed)
 Chief Complaint: IVC filter retrieval/removal   Referring Provider(s): Iruku, Praveena, MD   Supervising Physician: Jenna Hacker  Patient Status: [facility] - Out-pt  History of Present Illness: [name] is a 68 y.o. female  who had a bone marrow biopsy performed in July 2025 with subsequent arterial hemorrhage secondary to the procedure. During the workup the patient was going to require anticoagulation, however the patient could not be anticoagulated despite having a DVT involving the external iliac vein, internal iliac vein, and femoral vein in the left lower extremity. On 12/14/2023 the patient underwent Denali IVC filter placement as well as embolization of an arterial bleed. The patient was subsequently discharged and is now fully anticoagulated on oral anticoagulation. Patient is taking 5 mg of Eliquis  daily. Pt was referred to IR service for IVC filter retrieval and met with Dr. Jenna in clinic on 03/03/24. Discussed procedure and risks/benefits, with patient agreement to proceed with the retrieval. Patient presents today to [facility] for the procedure.  Without complaint today. Has been npo. Continues to take her eliquis  as instructed by Dr. Jenna.   Patient is Full Code  Past Medical History:  Diagnosis Date   Anxiety    Arthritis    Atrophic vaginitis    Depression    GERD (gastroesophageal reflux disease)    GERD (gastroesophageal reflux disease)    Hypertension    Osteoarthritis    Seasonal allergies    Sleep apnea     Past Surgical History:  Procedure Laterality Date    fistula repair x 3  10 yrs ago   ABDOMINAL HYSTERECTOMY  yrs ago   COLONOSCOPY     IR ANGIOGRAM PELVIS SELECTIVE OR SUPRASELECTIVE  12/14/2023   IR ANGIOGRAM SELECTIVE EACH ADDITIONAL VESSEL  12/14/2023   IR EMBO ART  VEN HEMORR LYMPH EXTRAV  INC GUIDE ROADMAPPING  12/14/2023   IR IVC FILTER PLMT / S&I /IMG GUID/MOD SED  12/14/2023   IR US  GUIDE VASC ACCESS RIGHT  12/14/2023   JOINT  REPLACEMENT  2012   right   right knee arthroscopic surgery  yrs ago   right rotator cuff repair  8 yrs    TOTAL KNEE ARTHROPLASTY  04/14/2012   Procedure: TOTAL KNEE ARTHROPLASTY;  Surgeon: Donnice JONETTA Car, MD;  Location: [facility];  Service: Orthopedics;  Laterality: Left;    Allergies: Morphine and codeine and Povidone  Medications: Prior to Admission medications   Medication Sig Start Date End Date Taking? Authorizing Provider  ALPRAZolam  (XANAX ) 0.25 MG tablet Take 0.25 mg by mouth as needed. anxiety   Yes [provider]  apixaban  (ELIQUIS ) 5 MG TABS tablet Take 1 tablet (5 mg total) by mouth 2 (two) times daily. 03/15/24  Yes Iruku, Praveena, MD  FLUoxetine  (PROZAC ) 20 MG capsule Take 20 mg by mouth every evening. 07/13/14  Yes [provider]  gabapentin  (NEURONTIN ) 300 MG capsule Take 1 capsule by mouth three times daily as needed 02/24/24  Yes Corey, Evan S, MD  HYDROcodone -acetaminophen  (NORCO/VICODIN) 5-325 MG tablet Take 1-2 tablets by mouth every 6 (six) hours as needed for moderate pain (pain score 4-6). 12/15/23  Yes Vernon Ranks, MD  hydroxyurea  (HYDREA ) 500 MG capsule Take 1 capsule (500 mg total) by mouth 2 (two) times daily. May take with food to minimize GI side effects. 03/11/24  Yes Iruku, Praveena, MD  KRILL OIL PO Take 1 capsule by mouth daily.   Yes [provider]  losartan (COZAAR) 50 MG tablet Take  50 mg by mouth every evening.   Yes [provider]  Probiotic Product (PROBIOTIC DAILY PO) Take 1 capsule by mouth daily.   Yes [provider]  ferrous sulfate  324 MG TBEC Take 324 mg by mouth every other day.    [provider]  pantoprazole  (PROTONIX ) 40 MG tablet Take 40 mg by mouth daily.    [provider]  promethazine  (PHENERGAN ) 25 MG tablet Take 12.5-25 mg by mouth every 6 (six) hours as needed for nausea or vomiting.    [provider]     Family History  Problem Relation Age of Onset    Diabetes Maternal Grandfather     Social History   Socioeconomic History   Marital status: Married    Spouse name: Not on file   Number of children: Not on file   Years of education: Not on file   Highest education level: Not on file  Occupational History   Not on file  Tobacco Use   Smoking status: Former    Current packs/day: 0.00    Average packs/day: 0.3 packs/day for 20.0 years (5.0 ttl pk-yrs)    Types: Cigarettes    Start date: 06/10/1986    Quit date: 06/10/2006    Years since quitting: 17.7   Smokeless tobacco: Never  Vaping Use   Vaping status: Never Used  Substance and Sexual Activity   Alcohol use: Yes    Alcohol/week: 2.0 standard drinks of alcohol    Types: 2 Glasses of wine per week   Drug use: Yes    Types: Marijuana    Comment: marijuan use 30 yrs ago, none current   Sexual activity: Not Currently  Other Topics Concern   Not on file  Social History Narrative   Not on file   Social Drivers of Health   Financial Resource Strain: Not on file  Food Insecurity: No Food Insecurity (12/11/2023)   Hunger Vital Sign    Worried About Running Out of Food in the Last Year: Never true    Ran Out of Food in the Last Year: Never true  Transportation Needs: No Transportation Needs (12/11/2023)   PRAPARE - Administrator, Civil Service (Medical): No    Lack of Transportation (Non-Medical): No  Physical Activity: Not on file  Stress: Not on file  Social Connections: Socially Integrated (12/11/2023)   Social Connection and Isolation Panel    Frequency of Communication with Friends and Family: More than three times a week    Frequency of Social Gatherings with Friends and Family: More than three times a week    Attends Religious Services: More than 4 times per year    Active Member of [employer] or Organizations: Yes    Attends Banker Meetings: 1 to 4 times per year    Marital Status: Married     Review of Systems: A 12 point ROS discussed and  pertinent positives are indicated in the HPI above.  All other systems are negative.    Vital Signs: BP 138/62   Pulse 69   Temp 97.8 F (36.6 C) (Oral)   Resp 18   Ht 5' 5.5 (1.664 m)   Wt 190 lb (86.2 kg)   SpO2 95%   BMI 31.14 kg/m   Advance Care Plan: No documents on file  Physical Exam Vitals and nursing note reviewed.  Constitutional:      Appearance: Normal appearance.  HENT:     Mouth/Throat:  Mouth: Mucous membranes are moist.     Pharynx: Oropharynx is clear.  Cardiovascular:     Rate and Rhythm: Normal rate and regular rhythm.  Pulmonary:     Effort: Pulmonary effort is normal.     Breath sounds: Normal breath sounds.  Abdominal:     Palpations: Abdomen is soft.     Tenderness: There is no abdominal tenderness.  Musculoskeletal:     Right lower leg: No edema.     Left lower leg: No edema.  Skin:    General: Skin is warm and dry.  Neurological:     Mental Status: She is alert and oriented to person, place, and time. Mental status is at baseline.     Imaging: No results found.  Labs:  CBC: Recent Labs    01/05/24 1112 01/14/24 1209 02/10/24 1302 03/11/24 0906  WBC 8.8 7.4 6.7 8.6  HGB 10.9* 11.9* 13.5 14.2  HCT 34.6* 37.0 41.9 41.9  PLT 587* 460* 513* 498*    COAGS: No results for input(s): INR, APTT in the last 8760 hours.  BMP: Recent Labs    12/10/23 1946 12/11/23 0508 12/15/23 0455 03/19/24 0940  NA 135 135 136 140  K 4.3 3.8 4.3 4.2  CL 99 100 100 104  CO2 23 27 25 25   GLUCOSE 145* 96 93 102*  BUN 29* 22 15 16   CALCIUM 8.9 8.6* 8.7* 8.9  CREATININE 1.04* 0.86 0.82 0.73  GFRNONAA 59* >60 >60 >60    LIVER FUNCTION TESTS: Recent Labs    07/05/23 1100 12/10/23 1946  BILITOT 0.4 0.8  AST 16 21  ALT 15 17  ALKPHOS 67 65  PROT 7.0 7.2  ALBUMIN 4.0 4.0    TUMOR MARKERS: No results for input(s): AFPTM, CEA, CA199, CHROMGRNA in the last 8760 hours.  Assessment and Plan:  [name] is a 68  y.o. female  who had a bone marrow biopsy performed in July 2025 with subsequent arterial hemorrhage secondary to the procedure. During the workup the patient was going to require anticoagulation, however the patient could not be anticoagulated despite having a DVT involving the external iliac vein, internal iliac vein, and femoral vein in the left lower extremity. On 12/14/2023 the patient underwent Denali IVC filter placement as well as embolization of an arterial bleed. The patient was subsequently discharged and is now fully anticoagulated on oral anticoagulation. Patient is taking 5 mg of Eliquis  daily. Pt was referred to IR service for IVC filter retrieval and met with Dr. Jenna in clinic on 03/03/24. Discussed procedure and risks/benefits, with patient agreement to proceed with the retrieval. Patient presents today to [facility] for the procedure.  Without complaint today. Has been npo. Continues to take her eliquis  as instructed by Dr. Jenna.  Risks and benefits discussed with the patient including, but not limited to bleeding, infection, contrast induced renal failure, filter fracture or migration which can lead to emergency surgery or even death, strut penetration with damage or irritation to adjacent structures and caval thrombosis.  All of the patient's questions were answered, patient is agreeable to proceed. Consent signed and in chart.   Thank you for allowing our service to participate in [name] 's care.  Electronically Signed: Kimble VEAR Clas, PA-C   03/19/2024, 10:42 AM      I spent a total of  15 Minutes   in face to face in clinical consultation, greater than 50% of which was counseling/coordinating care for IVC filter  retrieval/removal

## 2024-03-19 NOTE — Procedures (Signed)
 Interventional Radiology Procedure Note  Procedure: IVC filter retireval  Complications: None  Estimated Blood Loss: < 10 mL  Findings: Denali IVC filter retrieved.  Cordella DELENA Banner, MD

## 2024-03-22 ENCOUNTER — Telehealth: Payer: Self-pay | Admitting: Family Medicine

## 2024-03-22 NOTE — Telephone Encounter (Signed)
 Patient called and stated that she is taking gabapentin  for her neuropathy and is taking three a day. She would like to know if she can take four. She wouldn't take four everyday but she wants to know if that would be okay. Here and there her feet feel like they are on fire. She said she's done that a few times and it settles it down for a few days so if she could do that here and there that would be great. Please advise.

## 2024-03-23 ENCOUNTER — Other Ambulatory Visit (HOSPITAL_COMMUNITY): Payer: Self-pay

## 2024-03-23 MED ORDER — GABAPENTIN 300 MG PO CAPS: 300.0000 mg | ORAL_CAPSULE | Freq: Four times a day (QID) | ORAL | 0 refills | Status: AC | PRN

## 2024-03-23 NOTE — Telephone Encounter (Signed)
 That is a fair amount of gabapentin .  It is still within the okay limits to take a total of 1200 mg/day.

## 2024-03-23 NOTE — Addendum Note (Signed)
 Addended by: MARDY LEOTIS RAMAN on: 03/23/2024 01:46 PM   Modules accepted: Orders

## 2024-03-29 ENCOUNTER — Other Ambulatory Visit (HOSPITAL_COMMUNITY): Payer: Self-pay

## 2024-04-13 ENCOUNTER — Other Ambulatory Visit: Payer: Self-pay | Admitting: *Deleted

## 2024-04-13 ENCOUNTER — Other Ambulatory Visit (HOSPITAL_COMMUNITY): Payer: Self-pay

## 2024-04-13 ENCOUNTER — Inpatient Hospital Stay (HOSPITAL_BASED_OUTPATIENT_CLINIC_OR_DEPARTMENT_OTHER): Admitting: Hematology and Oncology

## 2024-04-13 ENCOUNTER — Inpatient Hospital Stay: Attending: Hematology and Oncology

## 2024-04-13 VITALS — BP 126/53 | HR 84 | Temp 97.3°F | Resp 17 | Wt 189.2 lb

## 2024-04-13 DIAGNOSIS — D75839 Thrombocytosis, unspecified: Secondary | ICD-10-CM

## 2024-04-13 DIAGNOSIS — Z79899 Other long term (current) drug therapy: Secondary | ICD-10-CM | POA: Insufficient documentation

## 2024-04-13 DIAGNOSIS — Z87891 Personal history of nicotine dependence: Secondary | ICD-10-CM | POA: Diagnosis not present

## 2024-04-13 DIAGNOSIS — D473 Essential (hemorrhagic) thrombocythemia: Secondary | ICD-10-CM | POA: Diagnosis not present

## 2024-04-13 DIAGNOSIS — I82402 Acute embolism and thrombosis of unspecified deep veins of left lower extremity: Secondary | ICD-10-CM | POA: Insufficient documentation

## 2024-04-13 DIAGNOSIS — I82412 Acute embolism and thrombosis of left femoral vein: Secondary | ICD-10-CM

## 2024-04-13 DIAGNOSIS — I82501 Chronic embolism and thrombosis of unspecified deep veins of right lower extremity: Secondary | ICD-10-CM | POA: Insufficient documentation

## 2024-04-13 LAB — CBC WITH DIFFERENTIAL/PLATELET
Abs Immature Granulocytes: 0.02 K/uL (ref 0.00–0.07)
Basophils Absolute: 0.1 K/uL (ref 0.0–0.1)
Basophils Relative: 1 %
Eosinophils Absolute: 0.2 K/uL (ref 0.0–0.5)
Eosinophils Relative: 2 %
HCT: 39.6 % (ref 36.0–46.0)
Hemoglobin: 13.6 g/dL (ref 12.0–15.0)
Immature Granulocytes: 0 %
Lymphocytes Relative: 33 %
Lymphs Abs: 2.4 K/uL (ref 0.7–4.0)
MCH: 31.6 pg (ref 26.0–34.0)
MCHC: 34.3 g/dL (ref 30.0–36.0)
MCV: 92.1 fL (ref 80.0–100.0)
Monocytes Absolute: 0.5 K/uL (ref 0.1–1.0)
Monocytes Relative: 7 %
Neutro Abs: 4.1 K/uL (ref 1.7–7.7)
Neutrophils Relative %: 57 %
Platelets: 394 K/uL (ref 150–400)
RBC: 4.3 MIL/uL (ref 3.87–5.11)
RDW: 18.7 % — ABNORMAL HIGH (ref 11.5–15.5)
WBC: 7.3 K/uL (ref 4.0–10.5)
nRBC: 0 % (ref 0.0–0.2)

## 2024-04-13 MED ORDER — APIXABAN 2.5 MG PO TABS
2.5000 mg | ORAL_TABLET | Freq: Two times a day (BID) | ORAL | 3 refills | Status: AC
  Filled 2024-04-16 (×2): qty 180, 90d supply, fill #0

## 2024-04-13 MED ORDER — APIXABAN 2.5 MG PO TABS
2.5000 mg | ORAL_TABLET | Freq: Two times a day (BID) | ORAL | 3 refills | Status: DC
  Filled 2024-04-13: qty 60, 30d supply, fill #0

## 2024-04-13 NOTE — Progress Notes (Signed)
 [facility] CONSULT NOTE  Patient Care Team: Leonel Cole, MD as PCP - General (Family Medicine)  CHIEF COMPLAINTS/PURPOSE OF CONSULTATION:  Essential thrombocytosis.  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 Essential thrombocythemia managed with Hydrea , maintaining platelet counts below 400.  High risk for re-clotting due to essential thrombocythemia. Bruising and bleeding from current blood thinner dosage. - Continue Hydrea  twice daily. - CBC today with plt count less than 400. - Ok to continue current dose  Left DVT following the BMB. This was treated with therapeutic anticoagulation and she had a FU US  which showed resolution Now most recently when she came for the IVC removal, she was found to have right distal femoral vein thrombus which was thought to be chronic We discussed that the guideline recommend anticoagulation with Hydrea  in ET especially given the above mentioned history Now, since she had bleeding difficulty and bruising, ok to try prophylactic dose of anticoagulation.  HISTORY OF PRESENTING ILLNESS:  [name] 68 y.o. female is here because of thrombocytosis. Discussed the use of AI scribe software for clinical note transcription with the patient, who gave verbal consent to proceed.  History of Present Illness [name] is a 68 year old female with essential thrombocytosis who presents for follow-up regarding her blood counts and medication management.  She has a history of essential thrombocytosis and is currently taking Hydrea  twice daily. Her blood counts have been stable, with a hemoglobin level of 13.6 and platelet count of 394,000. She tolerates Hydrea  well and finds it less expensive compared to previous medications.  She has been on a blood thinner for three months following a post-procedure blood clot. The clot  was initially detected in her left leg in July, but subsequent ultrasounds, including one on July 25th, did not show the clot. Recently, a non-occlusive clot was noted in the right leg, described as chronic or scarring. She is concerned about continuing blood thinners due to bruising and a bleeding incident at [employer] where 'blood just started pouring.'   All other systems were reviewed with the patient and are negative.  MEDICAL HISTORY:  Past Medical History:  Diagnosis Date   Anxiety    Arthritis    Atrophic vaginitis    Depression    GERD (gastroesophageal reflux disease)    GERD (gastroesophageal reflux disease)    Hypertension    Osteoarthritis    Seasonal allergies    Sleep apnea     SURGICAL HISTORY: Past Surgical History:  Procedure Laterality Date    fistula repair x 3  10 yrs ago   ABDOMINAL HYSTERECTOMY  yrs ago   COLONOSCOPY     IR ANGIOGRAM PELVIS SELECTIVE OR SUPRASELECTIVE  12/14/2023   IR ANGIOGRAM SELECTIVE EACH ADDITIONAL VESSEL  12/14/2023   IR EMBO ART  VEN HEMORR LYMPH EXTRAV  INC GUIDE ROADMAPPING  12/14/2023   IR IVC FILTER PLMT / S&I /IMG GUID/MOD SED  12/14/2023   IR IVC FILTER RETRIEVAL / S&I /IMG GUID/MOD SED  03/19/2024   IR US  GUIDE VASC ACCESS RIGHT  12/14/2023   JOINT REPLACEMENT  2012   right   right knee arthroscopic surgery  yrs ago   right rotator cuff repair  8 yrs    TOTAL KNEE ARTHROPLASTY  04/14/2012   Procedure: TOTAL KNEE ARTHROPLASTY;  Surgeon: Donnice JONETTA Car, MD;  Location: [facility];  Service:  Orthopedics;  Laterality: Left;    SOCIAL HISTORY: Social History   Socioeconomic History   Marital status: Married    Spouse name: Not on file   Number of children: Not on file   Years of education: Not on file   Highest education level: Not on file  Occupational History   Not on file  Tobacco Use   Smoking status: Former    Current packs/day: 0.00    Average packs/day: 0.3 packs/day for 20.0 years (5.0 ttl pk-yrs)    Types: Cigarettes     Start date: 06/10/1986    Quit date: 06/10/2006    Years since quitting: 17.8   Smokeless tobacco: Never  Vaping Use   Vaping status: Never Used  Substance and Sexual Activity   Alcohol use: Yes    Alcohol/week: 2.0 standard drinks of alcohol    Types: 2 Glasses of wine per week   Drug use: Yes    Types: Marijuana    Comment: marijuan use 30 yrs ago, none current   Sexual activity: Not Currently  Other Topics Concern   Not on file  Social History Narrative   Not on file   Social Drivers of Health   Financial Resource Strain: Not on file  Food Insecurity: No Food Insecurity (12/11/2023)   Hunger Vital Sign    Worried About Running Out of Food in the Last Year: Never true    Ran Out of Food in the Last Year: Never true  Transportation Needs: No Transportation Needs (12/11/2023)   PRAPARE - Administrator, Civil Service (Medical): No    Lack of Transportation (Non-Medical): No  Physical Activity: Not on file  Stress: Not on file  Social Connections: Socially Integrated (12/11/2023)   Social Connection and Isolation Panel    Frequency of Communication with Friends and Family: More than three times a week    Frequency of Social Gatherings with Friends and Family: More than three times a week    Attends Religious Services: More than 4 times per year    Active Member of [employer] or Organizations: Yes    Attends Banker Meetings: 1 to 4 times per year    Marital Status: Married  Catering Manager Violence: Not At Risk (12/11/2023)   Humiliation, Afraid, Rape, and Kick questionnaire    Fear of Current or Ex-Partner: No    Emotionally Abused: No    Physically Abused: No    Sexually Abused: No    FAMILY HISTORY: Family History  Problem Relation Age of Onset   Diabetes Maternal Grandfather     ALLERGIES:  is allergic to morphine and codeine and povidone.  MEDICATIONS:  Current Outpatient Medications  Medication Sig Dispense Refill   ALPRAZolam  (XANAX ) 0.25 MG  tablet Take 0.25 mg by mouth as needed. anxiety     apixaban  (ELIQUIS ) 5 MG TABS tablet Take 1 tablet (5 mg total) by mouth 2 (two) times daily. 90 tablet 0   ferrous sulfate  324 MG TBEC Take 324 mg by mouth every other day.     FLUoxetine  (PROZAC ) 20 MG capsule Take 20 mg by mouth every evening.  3   gabapentin  (NEURONTIN ) 300 MG capsule Take 1 capsule (300 mg total) by mouth 4 (four) times daily as needed. 360 capsule 0   HYDROcodone -acetaminophen  (NORCO/VICODIN) 5-325 MG tablet Take 1-2 tablets by mouth every 6 (six) hours as needed for moderate pain (pain score 4-6). 30 tablet 0   hydroxyurea  (HYDREA ) 500 MG capsule Take  1 capsule (500 mg total) by mouth 2 (two) times daily. May take with food to minimize GI side effects. 180 capsule 0   KRILL OIL PO Take 1 capsule by mouth daily.     losartan (COZAAR) 50 MG tablet Take 50 mg by mouth every evening.     pantoprazole  (PROTONIX ) 40 MG tablet Take 40 mg by mouth daily.     Probiotic Product (PROBIOTIC DAILY PO) Take 1 capsule by mouth daily.     promethazine  (PHENERGAN ) 25 MG tablet Take 12.5-25 mg by mouth every 6 (six) hours as needed for nausea or vomiting.     No current facility-administered medications for this visit.     PHYSICAL EXAMINATION: ECOG PERFORMANCE STATUS: 0 - Asymptomatic  Vitals:   04/13/24 1157  BP: (!) 126/53  Pulse: 84  Resp: 17  Temp: (!) 97.3 F (36.3 C)  SpO2: 97%   Filed Weights   04/13/24 1157  Weight: 189 lb 3.2 oz (85.8 kg)    GENERAL:alert, no distress and comfortable No cervical adenopathy CTA bilaterally RRR Abdomen: soft, non tender, non distended, BS normal No LE edema.  LABORATORY DATA:  I have reviewed the data as listed Lab Results  Component Value Date   WBC 7.3 04/13/2024   HGB 13.6 04/13/2024   HCT 39.6 04/13/2024   MCV 92.1 04/13/2024   PLT 394 04/13/2024     Chemistry      Component Value Date/Time   NA 140 03/19/2024 0940   K 4.2 03/19/2024 0940   CL 104 03/19/2024  0940   CO2 25 03/19/2024 0940   BUN 16 03/19/2024 0940   CREATININE 0.73 03/19/2024 0940   CREATININE 0.91 07/05/2023 1100      Component Value Date/Time   CALCIUM 8.9 03/19/2024 0940   ALKPHOS 65 12/10/2023 1946   AST 21 12/10/2023 1946   AST 16 07/05/2023 1100   ALT 17 12/10/2023 1946   ALT 15 07/05/2023 1100   BILITOT 0.8 12/10/2023 1946   BILITOT 0.4 07/05/2023 1100       RADIOGRAPHIC STUDIES: I have personally reviewed the radiological images as listed and agreed with the findings in the report. IR IVC FILTER RETRIEVAL / S&I PORTER GUID/MOD SED Result Date: 03/22/2024 INDICATION: The patient is satisfactorily anticoagulated with oral anticoagulant a no longer needs an IVC filter. Planned removal. EXAM: IVC filter retrieval under fluoroscopy MEDICATIONS: None. ANESTHESIA/SEDATION: Moderate (conscious) sedation was employed during this procedure. A total of Versed  2 mg and Fentanyl  100 mcg was administered intravenously by the radiology nurse. Total intra-service moderate Sedation Time: 20 minutes. The patient's level of consciousness and vital signs were monitored continuously by radiology nursing throughout the procedure under my direct supervision. FLUOROSCOPY: Radiation Exposure Index (as provided by the fluoroscopic device): 76 mGy Kerma COMPLICATIONS: None immediate. PROCEDURE: Informed written consent was obtained from the patient after a thorough discussion of the procedural risks, benefits and alternatives. All questions were addressed. Maximal Sterile Barrier Technique was utilized including caps, mask, sterile gowns, sterile gloves, sterile drape, hand hygiene and skin antiseptic. A timeout was performed prior to the initiation of the procedure. In a supine position, the right internal jugular vein was evaluated with ultrasound. The internal jugular vein is anechoic and compressible indicating patency. The venotomy site was anesthetized with 1% lidocaine . Small incision was  created. The needle was advanced under ultrasound guidance until the needle tip was identified within the lumen of the vein. Final image was obtained and stored in patient's permanent  medical record. Access was exchanged over an 018 guidewire for micropuncture sheath. Access was exchanged for an 035 system which was then advanced under fluoroscopic guidance into the IVC. Ten French sheath was then advanced over the guidewire and under fluoroscopic guidance the radiopaque marker on the distal tip of the sheath was placed proximal to the IVC filter. An inferior vena cavagram was performed in order to identify thrombus within the filter. The IVC filter is clear of thrombus in the IVC is patent. A triple loop snare device was then advanced through the sheath and used to capture the [facility] hook. The sheath was then advanced over the filter to compress the limbs. Slow gentle traction was then applied and the filter was completely retrieved from the patient. Final image demonstrates satisfactory patency of the IVC and no evidence of foreign object remaining. Hemostasis was achieved by direct hand compression. Sterile dressing was applied. IMPRESSION: Satisfactory retrieval of a Denali IVC filter. PLAN: This IVC filter is potentially retrievable. The patient will be assessed for filter retrieval by Interventional Radiology in approximately 8-12 weeks. Further recommendations regarding filter retrieval, continued surveillance or declaration of device permanence, will be made at that time. Electronically Signed   By: Cordella Banner   On: 03/22/2024 08:06     All questions were answered. The patient knows to call the clinic with any problems, questions or concerns. I spent 30 minutes in the care of this patient including H and P, review of records, counseling and coordination of care.     Amber Stalls, MD 04/13/2024 12:19 PM

## 2024-04-14 ENCOUNTER — Other Ambulatory Visit (HOSPITAL_COMMUNITY): Payer: Self-pay

## 2024-04-16 ENCOUNTER — Other Ambulatory Visit (HOSPITAL_COMMUNITY): Payer: Self-pay

## 2024-04-29 ENCOUNTER — Ambulatory Visit (INDEPENDENT_AMBULATORY_CARE_PROVIDER_SITE_OTHER): Payer: Self-pay

## 2024-04-29 DIAGNOSIS — R238 Other skin changes: Secondary | ICD-10-CM

## 2024-04-29 NOTE — Progress Notes (Signed)
 Filler Injection Procedure Note  Procedure:  Filler administration  Pre-operative Diagnosis: Rytides and loss of lip volume  Post-operative Diagnosis: Same  Surgeon: Electronically signed by: Jackelyn Illingworth, MD   Complications:  None  Brief history: The patient desires injection with fillers in her face. I discussed with the patient this proposed procedure of filler injections, which is customized depending on the particular needs of the patient. It is performed on facial volume loss as a temporary correction. The alternatives were discussed with the patient. The risks were addressed including bleeding, scarring, infection, damage to deeper structures, asymmetry, and chronic pain, which may occur infrequently after a procedure. The individual's choice to undergo a surgical procedure is based on the comparison of risks to potential benefits. Other risks include unsatisfactory results, allergic reaction, which should go away with time, bruising and delayed healing. Fillers do not arrest the aging process or produce permanent tightening.  Operative intervention maybe necessary to maintain the results. The patient understands and wishes to proceed. An informed consent was obtained and informational brochures given to her prior to the procedure.  Procedure: The area was prepped with chlorhexidine  and dried with a clean gauze. Using a clean technique, a 30 gauge needle was then used to inject the filler into the cheeks, nasal labial folds and upper and lower lips. This was done with 3 syringes.  No complications were noted. Light pressure was held for 5 minutes. She was instructed explicitly in post-operative care.  Restylane Kiss (1 cc) LOT: [id number] EXP: 2024-06-09  Voluma XC (1 ml)  LOT: [id number] EXP: 2024-08-30  Nieves HERO. Brennon Otterness, MD [facility]

## 2024-05-03 ENCOUNTER — Ambulatory Visit

## 2024-06-20 ENCOUNTER — Other Ambulatory Visit: Payer: Self-pay | Admitting: Family Medicine

## 2024-06-21 NOTE — Telephone Encounter (Signed)
 Last OV 05/14/23 Next OV not scheduled.   Pt has not been seen in > 1 year, needs OV. Please reach out to assist with scheduling.

## 2024-07-13 ENCOUNTER — Telehealth: Payer: Self-pay | Admitting: Hematology and Oncology

## 2024-07-13 NOTE — Telephone Encounter (Signed)
 I spoke with patient and she is aware of rescheduled lab and MD appointments from 07/20/2024 to 08/03/2024.

## 2024-07-20 ENCOUNTER — Inpatient Hospital Stay: Admitting: Hematology and Oncology

## 2024-07-20 ENCOUNTER — Inpatient Hospital Stay

## 2024-08-03 ENCOUNTER — Inpatient Hospital Stay: Admitting: Hematology and Oncology

## 2024-08-03 ENCOUNTER — Inpatient Hospital Stay: Attending: Hematology and Oncology

## 2024-09-27 ENCOUNTER — Ambulatory Visit: Admitting: Dermatology
